# Patient Record
Sex: Female | Born: 1980 | Hispanic: Yes | Marital: Married | Smoking: Never smoker
Health system: Southern US, Community
[De-identification: ages and names within clinical notes are randomized; demographics above are authoritative.]

## PROBLEM LIST (undated history)

## (undated) DIAGNOSIS — F329 Major depressive disorder, single episode, unspecified: Secondary | ICD-10-CM

## (undated) DIAGNOSIS — E041 Nontoxic single thyroid nodule: Secondary | ICD-10-CM

## (undated) DIAGNOSIS — F32A Depression, unspecified: Secondary | ICD-10-CM

## (undated) DIAGNOSIS — Z9221 Personal history of antineoplastic chemotherapy: Secondary | ICD-10-CM

## (undated) DIAGNOSIS — Z923 Personal history of irradiation: Secondary | ICD-10-CM

## (undated) DIAGNOSIS — Z803 Family history of malignant neoplasm of breast: Secondary | ICD-10-CM

## (undated) HISTORY — DX: Family history of malignant neoplasm of breast: Z80.3

## (undated) HISTORY — PX: OTHER SURGICAL HISTORY: SHX169

## (undated) HISTORY — PX: WISDOM TOOTH EXTRACTION: SHX21

---

## 2014-11-21 ENCOUNTER — Encounter (HOSPITAL_BASED_OUTPATIENT_CLINIC_OR_DEPARTMENT_OTHER): Payer: Self-pay | Admitting: Emergency Medicine

## 2014-11-21 ENCOUNTER — Emergency Department (HOSPITAL_BASED_OUTPATIENT_CLINIC_OR_DEPARTMENT_OTHER)
Admission: EM | Admit: 2014-11-21 | Discharge: 2014-11-21 | Disposition: A | Discharge status: Home / Self Care | Payer: Self-pay | Attending: Emergency Medicine | Admitting: Emergency Medicine

## 2014-11-21 DIAGNOSIS — R059 Cough, unspecified: Secondary | ICD-10-CM

## 2014-11-21 DIAGNOSIS — Z880 Allergy status to penicillin: Secondary | ICD-10-CM | POA: Insufficient documentation

## 2014-11-21 DIAGNOSIS — R05 Cough: Secondary | ICD-10-CM

## 2014-11-21 DIAGNOSIS — J029 Acute pharyngitis, unspecified: Secondary | ICD-10-CM | POA: Insufficient documentation

## 2014-11-21 DIAGNOSIS — H9209 Otalgia, unspecified ear: Secondary | ICD-10-CM | POA: Insufficient documentation

## 2014-11-21 LAB — RAPID STREP SCREEN (MED CTR MEBANE ONLY): Streptococcus, Group A Screen (Direct): NEGATIVE

## 2014-11-21 MED ORDER — IBUPROFEN 800 MG PO TABS: 800.0000 mg | ORAL_TABLET | Freq: Three times a day (TID) | ORAL | Status: DC

## 2014-11-21 MED ORDER — BENZONATATE 100 MG PO CAPS: 100.0000 mg | ORAL_CAPSULE | Freq: Three times a day (TID) | ORAL | Status: DC

## 2014-11-21 NOTE — ED Notes (Signed)
Pt reports occasional cough with green product, also reports onset sore throat this AM that is worsening

## 2014-11-21 NOTE — Discharge Instructions (Signed)
Cough, Adult  A cough is a reflex that helps clear your throat and airways. It can help heal the body or may be a reaction to an irritated airway. A cough may only last 2 or 3 weeks (acute) or may last more than 8 weeks (chronic).  CAUSES Acute cough:  Viral or bacterial infections. Chronic cough:  Infections.  Allergies.  Asthma.  Post-nasal drip.  Smoking.  Heartburn or acid reflux.  Some medicines.  Chronic lung problems (COPD).  Cancer. SYMPTOMS   Cough.  Fever.  Chest pain.  Increased breathing rate.  High-pitched whistling sound when breathing (wheezing).  Colored mucus that you cough up (sputum). TREATMENT   A bacterial cough may be treated with antibiotic medicine.  A viral cough must run its course and will not respond to antibiotics.  Your caregiver may recommend other treatments if you have a chronic cough. HOME CARE INSTRUCTIONS   Only take over-the-counter or prescription medicines for pain, discomfort, or fever as directed by your caregiver. Use cough suppressants only as directed by your caregiver.  Use a cold steam vaporizer or humidifier in your bedroom or home to help loosen secretions.  Sleep in a semi-upright position if your cough is worse at night.  Rest as needed.  Stop smoking if you smoke. SEEK IMMEDIATE MEDICAL CARE IF:   You have pus in your sputum.  Your cough starts to worsen.  You cannot control your cough with suppressants and are losing sleep.  You begin coughing up blood.  You have difficulty breathing.  You develop pain which is getting worse or is uncontrolled with medicine.  You have a fever. MAKE SURE YOU:   Understand these instructions.  Will watch your condition.  Will get help right away if you are not doing well or get worse. Document Released: 03/24/2011 Document Revised: 12/18/2011 Document Reviewed: 03/24/2011 [facility] Patient Information 2015 [place], [place]. This information is not intended  to replace advice given to you by your health care provider. Make sure you discuss any questions you have with your health care provider.  Pharyngitis Pharyngitis is a sore throat (pharynx). There is redness, pain, and swelling of your throat. HOME CARE   Drink enough fluids to keep your pee (urine) clear or pale yellow.  Only take medicine as told by your doctor.  You may get sick again if you do not take medicine as told. Finish your medicines, even if you start to feel better.  Do not take aspirin.  Rest.  Rinse your mouth (gargle) with salt water ( tsp of salt per 1 qt of water) every 1-2 hours. This will help the pain.  If you are not at risk for choking, you can suck on hard candy or sore throat lozenges. GET HELP IF:  You have large, tender lumps on your neck.  You have a rash.  You cough up green, yellow-brown, or bloody spit. GET HELP RIGHT AWAY IF:   You have a stiff neck.  You drool or cannot swallow liquids.  You throw up (vomit) or are not able to keep medicine or liquids down.  You have very bad pain that does not go away with medicine.  You have problems breathing (not from a stuffy nose). MAKE SURE YOU:   Understand these instructions.  Will watch your condition.  Will get help right away if you are not doing well or get worse. Document Released: 03/13/2008 Document Revised: 07/16/2013 Document Reviewed: 06/02/2013 [facility] Patient Information 2015 [place], [place]. This information  is not intended to replace advice given to you by your health care provider. Make sure you discuss any questions you have with your health care provider.

## 2014-11-21 NOTE — ED Notes (Signed)
Pt alert, NAD, calm, interactive, steady gait in to room, Dr. Jeneen Rinks into room, at [facility].

## 2014-11-21 NOTE — ED Provider Notes (Signed)
CSN: [id number]     Arrival date & time 11/21/14  1959 History   First MD Initiated Contact with Patient 11/21/14 2018     Chief Complaint  Patient presents with  . Sore Throat  . Otalgia      HPI  His reevaluation sore throat since this morning, cough 4 days. No fevers no chills. No sinus pain or pressure. He only drinks that difficulty. Some right ear pain. Dry nonproductive cough intermittently producing some yellow phlegm. No hemoptysis. No leg pain or swelling.  History reviewed. No pertinent past medical history. History reviewed. No pertinent past surgical history. History reviewed. No pertinent family history. History  Substance Use Topics  . Smoking status: Never Smoker   . Smokeless tobacco: Never Used  . Alcohol Use: No   OB History    No data available     Review of Systems  Constitutional: Negative for fever, chills, diaphoresis, appetite change and fatigue.  HENT: Positive for ear pain and sore throat. Negative for mouth sores and trouble swallowing.   Eyes: Negative for visual disturbance.  Respiratory: Positive for cough. Negative for chest tightness, shortness of breath and wheezing.   Cardiovascular: Negative for chest pain.  Gastrointestinal: Negative for nausea, vomiting, abdominal pain, diarrhea and abdominal distention.  Endocrine: Negative for polydipsia, polyphagia and polyuria.  Genitourinary: Negative for dysuria, frequency and hematuria.  Musculoskeletal: Negative for gait problem.  Skin: Negative for color change, pallor and rash.  Neurological: Negative for dizziness, syncope, light-headedness and headaches.  Hematological: Does not bruise/bleed easily.  Psychiatric/Behavioral: Negative for behavioral problems and confusion.      Allergies  Penicillins  Home Medications   Prior to Admission medications   Medication Sig Start Date End Date Taking? Authorizing Provider  benzonatate (TESSALON) 100 MG capsule Take 1 capsule (100 mg total)  by mouth every 8 (eight) hours. 11/21/14   Tanna Furry, MD  ibuprofen (ADVIL,MOTRIN) 800 MG tablet Take 1 tablet (800 mg total) by mouth 3 (three) times daily. 11/21/14   Tanna Furry, MD   BP 116/76 mmHg  Pulse 82  Temp(Src) 99 F (37.2 C) (Oral)  Resp 18  Wt 118 lb (53.524 kg)  SpO2 99%  LMP 11/05/2014 Physical Exam  Constitutional: She is oriented to person, place, and time. She appears well-developed and well-nourished. No distress.  HENT:  Head: Normocephalic.  Normal TMs without fluid or infection. Normal pharynx without erythema asymmetry. No strawberry appearance the tongue. Petechiae on the palate. No anterior cervical adenopathy. Supple neck.  Eyes: Conjunctivae are normal. Pupils are equal, round, and reactive to light. No scleral icterus.  Neck: Normal range of motion. Neck supple. No thyromegaly present.  Cardiovascular: Normal rate and regular rhythm.  Exam reveals no gallop and no friction rub.   No murmur heard. Pulmonary/Chest: Effort normal and breath sounds normal. No respiratory distress. She has no wheezes. She has no rales.  Her bilateral breath sounds. No asymmetry. No wheezing rales rhonchi or prolongation.  Abdominal: Soft. Bowel sounds are normal. She exhibits no distension. There is no tenderness. There is no rebound.  Musculoskeletal: Normal range of motion.  Neurological: She is alert and oriented to person, place, and time.  Skin: Skin is warm and dry. No rash noted.  Psychiatric: She has a normal mood and affect. Her behavior is normal.    ED Course  Procedures (including critical care time) Labs Review Labs Reviewed  RAPID STREP SCREEN    Imaging Review No results found.   EKG  Interpretation None      MDM   Final diagnoses:  Pharyngitis  Cough    Normal exam. Clear lungs. Well oxygenated. Afebrile. Anxious appropriate for simple expectant management.   Tanna Furry, MD 11/21/14 2033

## 2014-11-23 LAB — CULTURE, GROUP A STREP

## 2014-11-25 ENCOUNTER — Ambulatory Visit: Payer: Self-pay | Attending: Internal Medicine | Admitting: Physician Assistant

## 2014-11-25 VITALS — BP 144/93 | HR 98 | Temp 98.6°F | Resp 22 | Ht 61.0 in | Wt 116.4 lb

## 2014-11-25 DIAGNOSIS — J029 Acute pharyngitis, unspecified: Secondary | ICD-10-CM | POA: Insufficient documentation

## 2014-11-25 MED ORDER — AZITHROMYCIN 250 MG PO TABS: ORAL_TABLET | ORAL | Status: DC

## 2014-11-25 NOTE — Progress Notes (Signed)
Chief Complaint: Sore throat, I can't eat  Subjective: 34 year old female recently seen in the emergency department on February 13 for complaints of sore throat. She also had a cough, right ear pain. She was strep negative. She was diagnosed with pharyngitis and given Tessalon and Advil. She's now had worsening symptoms. She has been taking the medications. The whole right side of the face hurts. She's having difficulty opening her mouth. She is having difficulty eating and drinking. She has a burning sensation. Her symptoms started on Saturday.   ROS:  GEN: denies fever or chills, denies change in weight Skin: denies lesions or rashes HEENT: + headache, earache, epistaxis, sore throat, or neck pain NEURO: denies numbness or tingling, denies sz, stroke or TIA   Objective:  Filed Vitals:   11/25/14 1233  BP: 144/93  Pulse: 98  Temp: 98.6 F (37 C)  TempSrc: Oral  Resp: 22  Height: 5\' 1"  (1.549 m)  Weight: 116 lb 6.4 oz (52.799 kg)  SpO2: 100%    Physical Exam:  General: in no acute distress. HEENT: no pallor, no icterus, moist oral mucosa, no JVD, no lymphadenopathy Heart: Normal  s1 &s2  Regular rate and rhythm, without murmurs, rubs, gallops. Neuro: Alert, awake, oriented x3, nonfocal.  Pertinent Lab Results:none   Medications: Prior to Admission medications   Medication Sig Start Date End Date Taking? Authorizing Provider  azithromycin (ZITHROMAX) 250 MG tablet 2 tablets a day. Then 1 tablet per day for the next 4 days. 11/25/14   Kaleisha Bhargava Daneil Dan, PA-C  benzonatate (TESSALON) 100 MG capsule Take 1 capsule (100 mg total) by mouth every 8 (eight) hours. 11/21/14   Tanna Furry, MD  ibuprofen (ADVIL,MOTRIN) 800 MG tablet Take 1 tablet (800 mg total) by mouth 3 (three) times daily. 11/21/14   Tanna Furry, MD    Assessment: 1. Pharyngitis   Plan: Azithromycin, patient allergic to amoxicillin Return to work on Friday  Follow up: As needed  The patient was given clear  instructions to go to ER or return to medical center if symptoms don't improve, worsen or new problems develop. The patient verbalized understanding. The patient was told to call to get lab results if they haven't heard anything in the next week.   This note has been created with Surveyor, quantity. Any transcriptional errors are unintentional.   Zettie Pho, PA-C 11/25/2014, 12:52 PM

## 2014-11-25 NOTE — Progress Notes (Signed)
Pt presents to clinic for follow up after ED visit for right ear pain and sore throat. Pt states she difficulty swallowing and has been taking ibuprofen with no relief.

## 2015-08-18 ENCOUNTER — Ambulatory Visit: Payer: Self-pay | Admitting: Physician Assistant

## 2016-03-28 ENCOUNTER — Other Ambulatory Visit (HOSPITAL_COMMUNITY): Payer: Self-pay | Admitting: Family

## 2016-03-28 DIAGNOSIS — E041 Nontoxic single thyroid nodule: Secondary | ICD-10-CM

## 2016-03-31 ENCOUNTER — Ambulatory Visit (HOSPITAL_COMMUNITY)
Admission: RE | Admit: 2016-03-31 | Discharge: 2016-03-31 | Disposition: A | Discharge status: Home / Self Care | Payer: Self-pay | Source: Ambulatory Visit | Attending: Family | Admitting: Family

## 2016-03-31 DIAGNOSIS — E041 Nontoxic single thyroid nodule: Secondary | ICD-10-CM | POA: Insufficient documentation

## 2016-04-19 ENCOUNTER — Other Ambulatory Visit (HOSPITAL_COMMUNITY): Payer: Self-pay | Admitting: Family

## 2016-04-19 DIAGNOSIS — E041 Nontoxic single thyroid nodule: Secondary | ICD-10-CM

## 2016-04-27 ENCOUNTER — Ambulatory Visit (HOSPITAL_COMMUNITY)
Admission: RE | Admit: 2016-04-27 | Discharge: 2016-04-27 | Disposition: A | Discharge status: Home / Self Care | Payer: Self-pay | Source: Ambulatory Visit | Attending: Family | Admitting: Family

## 2016-04-27 DIAGNOSIS — E041 Nontoxic single thyroid nodule: Secondary | ICD-10-CM | POA: Insufficient documentation

## 2016-04-27 MED ORDER — LIDOCAINE HCL (PF) 2 % IJ SOLN
INTRAMUSCULAR | Status: AC
  Filled 2016-04-27: qty 10

## 2016-04-27 NOTE — Discharge Instructions (Signed)

## 2016-05-29 ENCOUNTER — Ambulatory Visit (INDEPENDENT_AMBULATORY_CARE_PROVIDER_SITE_OTHER): Payer: Self-pay | Admitting: Otolaryngology

## 2016-05-29 DIAGNOSIS — D440 Neoplasm of uncertain behavior of thyroid gland: Secondary | ICD-10-CM

## 2016-07-09 DIAGNOSIS — E041 Nontoxic single thyroid nodule: Secondary | ICD-10-CM

## 2016-07-09 HISTORY — DX: Nontoxic single thyroid nodule: E04.1

## 2016-07-10 ENCOUNTER — Encounter: Payer: Self-pay | Admitting: "Endocrinology

## 2016-07-10 ENCOUNTER — Ambulatory Visit (INDEPENDENT_AMBULATORY_CARE_PROVIDER_SITE_OTHER): Payer: Self-pay | Admitting: "Endocrinology

## 2016-07-10 VITALS — BP 103/69 | HR 60 | Ht 61.0 in | Wt 119.0 lb

## 2016-07-10 DIAGNOSIS — D497 Neoplasm of unspecified behavior of endocrine glands and other parts of nervous system: Secondary | ICD-10-CM | POA: Insufficient documentation

## 2016-07-10 NOTE — Progress Notes (Signed)
Subjective:    Patient ID: Sophia Moore, female    DOB: 25-May-1981, PCP Soyla Dryer, PA-C   History reviewed. No pertinent past medical history. History reviewed. No pertinent surgical history. Social History   Social History  . Marital status: Divorced    Spouse name: N/A  . Number of children: N/A  . Years of education: N/A   Social History Main Topics  . Smoking status: Never Smoker  . Smokeless tobacco: Never Used  . Alcohol use No  . Drug use: No  . Sexual activity: Not Currently   Other Topics Concern  . None   Social History Narrative  . None   Outpatient Encounter Prescriptions as of 07/10/2016  Medication Sig  . citalopram (CELEXA) 40 MG tablet Take 40 mg by mouth daily.  . methimazole (TAPAZOLE) 10 MG tablet Take 10 mg by mouth 3 (three) times daily.  . [DISCONTINUED] azithromycin (ZITHROMAX) 250 MG tablet 2 tablets a day. Then 1 tablet per day for the next 4 days.  . [DISCONTINUED] benzonatate (TESSALON) 100 MG capsule Take 1 capsule (100 mg total) by mouth every 8 (eight) hours.  . [DISCONTINUED] ibuprofen (ADVIL,MOTRIN) 800 MG tablet Take 1 tablet (800 mg total) by mouth 3 (three) times daily.   No facility-administered encounter medications on file as of 07/10/2016.    ALLERGIES: Allergies  Allergen Reactions  . Penicillins    VACCINATION STATUS:  There is no immunization history on file for this patient.  HPI 35 year old female patient with medical history as above. She is being seen in consultation for abnormal thyroid FNA requested by her PCP Soyla Dryer, PA-C. - She is reporting that she has known about goiter for at least a year. In June thyroid ultrasound showed solitary nodule 1.5 cm on the right lobe of her thyroid. She was subjected for fine needle aspiration on 04/27/2016 which revealed follicular neoplasm category IV. -She did not have proper insurance to proceed with appropriate care at that time. For reported thyroid  overactivity she was started on methimazole 10 mg by mouth daily. - She denies family history of thyroid dysfunction. She denies exposure to neck radiation. She denies heat/cold intolerance. She has steady body weight. Denies dysphagia, shortness of breath, nor voice change.  Review of Systems  Constitutional: no weight gain/loss, no fatigue, no subjective hyperthermia/hypothermia Eyes: no blurry vision, no xerophthalmia ENT: no sore throat, no nodules palpated in throat, no dysphagia/odynophagia, no hoarseness, she has goiter. Cardiovascular: no CP/SOB/palpitations/leg swelling Respiratory: no cough/SOB Gastrointestinal: no N/V/D/C Musculoskeletal: no muscle/joint aches Skin: no rashes Neurological: no tremors/numbness/tingling/dizziness Psychiatric: no depression/anxiety  Objective:    BP 103/69   Pulse 60   Ht 5\' 1"  (1.549 m)   Wt 119 lb (54 kg)   BMI 22.48 kg/m   Wt Readings from Last 3 Encounters:  07/10/16 119 lb (54 kg)  11/25/14 116 lb 6.4 oz (52.8 kg)  11/21/14 118 lb (53.5 kg)    Physical Exam Constitutional: overweight, in NAD Eyes: PERRLA, EOMI, no exophthalmos ENT: moist mucous membranes, + palpable thyromegaly, no cervical lymphadenopathy Cardiovascular: RRR, No MRG Respiratory: CTA B Gastrointestinal: abdomen soft, NT, ND, BS+ Musculoskeletal: no deformities, strength intact in all 4 Skin: moist, warm, no rashes Neurological: no tremor with outstretched hands, DTR normal in all 4   July 20 11/29/2015 fine-needle aspiration of 1.5 cm solitary right lobe thyroid nodule showed: Follicular neoplasm or suspicious for a follicular neoplasm (bethesda  category IV).  Assessment & Plan:   1.  Follicular neoplasm of thyroid - I have reviewed her available thyroid  records and clinically evaluated the patient. Based on the abnormal FNA pointing towards a follicular neoplasm she has as high as  30% chance of malignancy. I have advised her for  total thyroidectomy. She  is made aware of the fact that she may require lifelong thyroid hormone replacement. - since she has seen Dr. Benjamine Mola, for her FNA, I will refer her back to him for surgery. She will have repeat thyroid function test before her next visit in 6 weeks. - If her surgical sample reveals malignancy, she will be considered for I-131 thyroid remnant ablation.   - I advised patient to maintain close follow up with Soyla Dryer, PA-C for primary care needs. Follow up plan: Return in about 6 weeks (around 08/21/2016) for with labs after surgery.  Glade Lloyd, MD Phone: [phone]  Fax: [phone]   07/10/2016, 1:34 PM

## 2016-07-31 ENCOUNTER — Other Ambulatory Visit: Payer: Self-pay | Admitting: Otolaryngology

## 2016-08-08 ENCOUNTER — Encounter (HOSPITAL_BASED_OUTPATIENT_CLINIC_OR_DEPARTMENT_OTHER): Payer: Self-pay | Admitting: *Deleted

## 2016-08-08 NOTE — Pre-Procedure Instructions (Signed)
Pt. was offered interpreter for family; states her son is 45 and speaks [place], refused interpreter.

## 2016-08-09 ENCOUNTER — Telehealth: Payer: Self-pay | Admitting: "Endocrinology

## 2016-08-09 NOTE — Telephone Encounter (Signed)
Please advise 

## 2016-08-09 NOTE — Telephone Encounter (Addendum)
Patient is concerned that Dr.Teoh is only removing half of her thyroid after Dr.Nida said it would be best for him to remove all of it. Please advise patient on what to do.   Patient called back on 08-10-16 to request Dr.Nida to contact Dr.Teoh and let him know that he should remove the entire thyroid. Dr.Teoh is waiting to hear from [place]. Her surgery is scheduled for next Tuesday.

## 2016-08-10 NOTE — Telephone Encounter (Signed)
Yes R spoke with Dr. Benjamine Mola, and he agrees that she will have total thyroidectomy.

## 2016-08-11 NOTE — Telephone Encounter (Signed)
Left message for pt to call back  °

## 2016-08-14 NOTE — Telephone Encounter (Signed)
Pt.notified

## 2016-09-05 ENCOUNTER — Telehealth: Payer: Self-pay | Admitting: "Endocrinology

## 2016-09-05 NOTE — Telephone Encounter (Signed)
Pt.notified

## 2016-09-05 NOTE — Telephone Encounter (Signed)
Surgery is scheduled for 09-13-16

## 2016-09-05 NOTE — Telephone Encounter (Signed)
Based on my discussion with Dr. Benjamine Mola, she is supposed to have total thyroidectomy. If she is undergoing total thyroidectomy , she will not need methimazole. Do we know if she has scheduled her surgery ?

## 2016-09-05 NOTE — Telephone Encounter (Signed)
Patient LMOM to request a refill on her methimazole to [facility] [place].

## 2016-09-05 NOTE — Telephone Encounter (Signed)
Pt is requesting methimazole. I did not see this in the office note to continue. Should she be on this?

## 2016-09-05 NOTE — Telephone Encounter (Signed)
Okay, good. She does not need that methimazole, she will be started on thyroid hormone after her surgery.

## 2016-09-08 NOTE — Pre-Procedure Instructions (Signed)
[facility]  09/08/2016      [place], [place] [address] [place] [address] Phone: [phone] Fax: [phone]  [facility] [place], [place] - [address] [place] [address] [place] [address] Phone: [phone] Fax: [phone]  [id number] [place], [place] - [address] AT [facility] [address] [address] [place] [address] Phone: [phone] Fax: [phone]    Your procedure is scheduled on Wed, Dec 6 @ 8:30 AM  Report to [place] at 6:30 AM  Call this number if you have problems the morning of surgery:  [phone]   Remember:  Do not eat food or drink liquids after midnight.  Take these medicines the morning of surgery with A SIP OF WATER Celexa(Citalopram)              No Goody's,BC's,Aleve,Advil,Motrin,Ibuprofen,Fish Oil,or any Herbal Medications.    Do not wear jewelry, make-up or nail polish.  Do not wear lotions, powders,perfumes, or deoderant.  Do not shave 48 hours prior to surgery.    Do not bring valuables to the hospital.  [facility] is not responsible for any belongings or valuables.  Contacts, dentures or bridgework may not be worn into surgery.  Leave your suitcase in the car.  After surgery it may be brought to your room.  For patients admitted to the hospital, discharge time will be determined by your treatment team.  Patients discharged the day of surgery will not be allowed to drive home.    [facility]  Before surgery, you can play an important role.  Because skin is not sterile, your skin needs to be as free of germs as possible.  You can reduce the number of germs on you skin by washing with CHG (chlorahexidine gluconate) soap before surgery.  CHG is an antiseptic cleaner which kills germs and bonds  with the skin to continue killing germs even after washing.  Please DO NOT use if you have an allergy to CHG or antibacterial soaps.  If your skin becomes reddened/irritated stop using the CHG and inform your nurse when you arrive at Short Stay.  Do not shave (including legs and underarms) for at least 48 hours prior to the first CHG shower.  You may shave your face.  Please follow these instructions carefully:   1.  Shower with CHG Soap the night before surgery and the                                morning of Surgery.  2.  If you choose to wash your hair, wash your hair first as usual with your       normal shampoo.  3.  After you shampoo, rinse your hair and body thoroughly to remove the                      Shampoo.  4.  Use CHG as you would any other liquid soap.  You can apply chg directly       to the skin and wash gently with scrungie or a clean washcloth.  5.  Apply the CHG Soap to your body ONLY FROM THE NECK DOWN.  Do not use on open wounds or open sores.  Avoid contact with your eyes,       ears, mouth and genitals (private parts).  Wash genitals (private parts)       with your normal soap.  6.  Wash thoroughly, paying special attention to the area where your surgery        will be performed.  7.  Thoroughly rinse your body with warm water from the neck down.  8.  DO NOT shower/wash with your normal soap after using and rinsing off       the CHG Soap.  9.  Pat yourself dry with a clean towel.            10.  Wear clean pajamas.            11.  Place clean sheets on your bed the night of your first shower and do not        sleep with pets.  Day of Surgery  Do not apply any lotions/deoderants the morning of surgery.  Please wear clean clothes to the hospital/surgery center.     Please read over the following fact sheets that you were given. Pain Booklet, Coughing and Deep Breathing and Surgical Site Infection Prevention

## 2016-09-11 ENCOUNTER — Ambulatory Visit (HOSPITAL_COMMUNITY)
Admission: RE | Admit: 2016-09-11 | Discharge: 2016-09-11 | Disposition: A | Discharge status: Home / Self Care | Payer: BC Managed Care – PPO | Source: Ambulatory Visit | Attending: Anesthesiology | Admitting: Anesthesiology

## 2016-09-11 ENCOUNTER — Encounter (HOSPITAL_COMMUNITY)
Admission: RE | Admit: 2016-09-11 | Discharge: 2016-09-11 | Disposition: A | Discharge status: Home / Self Care | Payer: BC Managed Care – PPO | Source: Ambulatory Visit | Attending: Otolaryngology | Admitting: Otolaryngology

## 2016-09-11 ENCOUNTER — Encounter (HOSPITAL_COMMUNITY): Payer: Self-pay

## 2016-09-11 DIAGNOSIS — Z01818 Encounter for other preprocedural examination: Secondary | ICD-10-CM | POA: Insufficient documentation

## 2016-09-11 DIAGNOSIS — E041 Nontoxic single thyroid nodule: Secondary | ICD-10-CM | POA: Insufficient documentation

## 2016-09-11 DIAGNOSIS — Z01812 Encounter for preprocedural laboratory examination: Secondary | ICD-10-CM | POA: Insufficient documentation

## 2016-09-11 LAB — CBC
HEMATOCRIT: 43.7 % (ref 36.0–46.0)
Hemoglobin: 15.2 g/dL — ABNORMAL HIGH (ref 12.0–15.0)
MCH: 29.6 pg (ref 26.0–34.0)
MCHC: 34.8 g/dL (ref 30.0–36.0)
MCV: 85 fL (ref 78.0–100.0)
Platelets: 257 10*3/uL (ref 150–400)
RBC: 5.14 MIL/uL — ABNORMAL HIGH (ref 3.87–5.11)
RDW: 12.5 % (ref 11.5–15.5)
WBC: 4.7 10*3/uL (ref 4.0–10.5)

## 2016-09-11 LAB — HCG, SERUM, QUALITATIVE: Preg, Serum: NEGATIVE

## 2016-09-11 NOTE — Progress Notes (Addendum)
Cardiologist denies  Medical Md looking for one. Didn't have insurance and was going to health dept   Echo/Stress test/heart cath denies  EKG denies in past yr   CXR denies in past yr

## 2016-09-13 ENCOUNTER — Ambulatory Visit (HOSPITAL_BASED_OUTPATIENT_CLINIC_OR_DEPARTMENT_OTHER)
Admission: RE | Admit: 2016-09-13 | Discharge: 2016-09-14 | Disposition: A | Discharge status: Home / Self Care | Payer: BC Managed Care – PPO | Source: Ambulatory Visit | Attending: Otolaryngology | Admitting: Otolaryngology

## 2016-09-13 ENCOUNTER — Encounter (HOSPITAL_COMMUNITY): Payer: Self-pay | Admitting: *Deleted

## 2016-09-13 ENCOUNTER — Ambulatory Visit (HOSPITAL_COMMUNITY): Payer: BC Managed Care – PPO | Admitting: Anesthesiology

## 2016-09-13 ENCOUNTER — Encounter (HOSPITAL_COMMUNITY)
Admission: RE | Disposition: A | Discharge status: Home / Self Care | Payer: Self-pay | Source: Ambulatory Visit | Attending: Otolaryngology

## 2016-09-13 DIAGNOSIS — Z880 Allergy status to penicillin: Secondary | ICD-10-CM | POA: Diagnosis not present

## 2016-09-13 DIAGNOSIS — E041 Nontoxic single thyroid nodule: Secondary | ICD-10-CM | POA: Diagnosis present

## 2016-09-13 DIAGNOSIS — R079 Chest pain, unspecified: Secondary | ICD-10-CM | POA: Diagnosis not present

## 2016-09-13 DIAGNOSIS — D34 Benign neoplasm of thyroid gland: Secondary | ICD-10-CM | POA: Insufficient documentation

## 2016-09-13 DIAGNOSIS — F329 Major depressive disorder, single episode, unspecified: Secondary | ICD-10-CM | POA: Insufficient documentation

## 2016-09-13 DIAGNOSIS — E039 Hypothyroidism, unspecified: Secondary | ICD-10-CM | POA: Diagnosis not present

## 2016-09-13 DIAGNOSIS — E890 Postprocedural hypothyroidism: Secondary | ICD-10-CM | POA: Diagnosis present

## 2016-09-13 DIAGNOSIS — D440 Neoplasm of uncertain behavior of thyroid gland: Secondary | ICD-10-CM | POA: Diagnosis not present

## 2016-09-13 HISTORY — DX: Depression, unspecified: F32.A

## 2016-09-13 HISTORY — DX: Nontoxic single thyroid nodule: E04.1

## 2016-09-13 HISTORY — DX: Major depressive disorder, single episode, unspecified: F32.9

## 2016-09-13 HISTORY — PX: THYROIDECTOMY: SHX17

## 2016-09-13 LAB — CALCIUM
Calcium: 9.1 mg/dL (ref 8.9–10.3)
Calcium: 9.4 mg/dL (ref 8.9–10.3)

## 2016-09-13 SURGERY — THYROIDECTOMY
Anesthesia: General

## 2016-09-13 MED ORDER — PROPOFOL 10 MG/ML IV BOLUS
INTRAVENOUS | Status: DC | PRN
  Administered 2016-09-13: 20 mg via INTRAVENOUS
  Administered 2016-09-13: 150 mg via INTRAVENOUS

## 2016-09-13 MED ORDER — DEXAMETHASONE SODIUM PHOSPHATE 10 MG/ML IJ SOLN
INTRAMUSCULAR | Status: DC | PRN
  Administered 2016-09-13: 10 mg via INTRAVENOUS

## 2016-09-13 MED ORDER — FENTANYL CITRATE (PF) 100 MCG/2ML IJ SOLN
INTRAMUSCULAR | Status: DC | PRN
  Administered 2016-09-13 (×4): 50 ug via INTRAVENOUS

## 2016-09-13 MED ORDER — PHENYLEPHRINE HCL 10 MG/ML IJ SOLN
INTRAVENOUS | Status: DC | PRN
  Administered 2016-09-13: 10 ug/min via INTRAVENOUS

## 2016-09-13 MED ORDER — PROPOFOL 10 MG/ML IV BOLUS
INTRAVENOUS | Status: AC
  Filled 2016-09-13: qty 20

## 2016-09-13 MED ORDER — FENTANYL CITRATE (PF) 100 MCG/2ML IJ SOLN
INTRAMUSCULAR | Status: AC
  Filled 2016-09-13: qty 2

## 2016-09-13 MED ORDER — MORPHINE SULFATE (PF) 2 MG/ML IV SOLN
2.0000 mg | INTRAVENOUS | Status: DC | PRN
  Administered 2016-09-13: 4 mg via INTRAVENOUS
  Filled 2016-09-13: qty 2

## 2016-09-13 MED ORDER — OXYCODONE-ACETAMINOPHEN 5-325 MG PO TABS: 1.0000 | ORAL_TABLET | ORAL | 0 refills | Status: DC | PRN

## 2016-09-13 MED ORDER — HYDROMORPHONE HCL 1 MG/ML IJ SOLN: 0.2500 mg | INTRAMUSCULAR | Status: DC | PRN

## 2016-09-13 MED ORDER — LIDOCAINE HCL (CARDIAC) 20 MG/ML IV SOLN
INTRAVENOUS | Status: DC | PRN
  Administered 2016-09-13: 60 mg via INTRAVENOUS

## 2016-09-13 MED ORDER — ONDANSETRON HCL 4 MG/2ML IJ SOLN
4.0000 mg | INTRAMUSCULAR | Status: DC | PRN
  Administered 2016-09-13: 4 mg via INTRAVENOUS
  Filled 2016-09-13: qty 2

## 2016-09-13 MED ORDER — KCL IN DEXTROSE-NACL 20-5-0.45 MEQ/L-%-% IV SOLN
INTRAVENOUS | Status: DC
  Administered 2016-09-13: 14:00:00 via INTRAVENOUS
  Filled 2016-09-13: qty 1000

## 2016-09-13 MED ORDER — LACTATED RINGERS IV SOLN
INTRAVENOUS | Status: DC | PRN
  Administered 2016-09-13: 08:00:00 via INTRAVENOUS

## 2016-09-13 MED ORDER — LIDOCAINE HCL (PF) 1 % IJ SOLN
INTRAMUSCULAR | Status: AC
  Filled 2016-09-13: qty 30

## 2016-09-13 MED ORDER — ONDANSETRON HCL 4 MG PO TABS: 4.0000 mg | ORAL_TABLET | ORAL | Status: DC | PRN

## 2016-09-13 MED ORDER — LIDOCAINE 2% (20 MG/ML) 5 ML SYRINGE
INTRAMUSCULAR | Status: AC
  Filled 2016-09-13: qty 5

## 2016-09-13 MED ORDER — LIDOCAINE-EPINEPHRINE 2 %-1:100000 IJ SOLN
INTRAMUSCULAR | Status: AC
  Filled 2016-09-13: qty 1

## 2016-09-13 MED ORDER — MIDAZOLAM HCL 2 MG/2ML IJ SOLN
INTRAMUSCULAR | Status: AC
  Filled 2016-09-13: qty 2

## 2016-09-13 MED ORDER — CLINDAMYCIN HCL 300 MG PO CAPS: 300.0000 mg | ORAL_CAPSULE | Freq: Three times a day (TID) | ORAL | 0 refills | Status: AC

## 2016-09-13 MED ORDER — LIDOCAINE-EPINEPHRINE 2 %-1:100000 IJ SOLN
INTRAMUSCULAR | Status: DC | PRN
  Administered 2016-09-13: 3 mL

## 2016-09-13 MED ORDER — OXYCODONE-ACETAMINOPHEN 5-325 MG PO TABS
1.0000 | ORAL_TABLET | ORAL | Status: DC | PRN
  Administered 2016-09-13 – 2016-09-14 (×4): 2 via ORAL
  Filled 2016-09-13 (×4): qty 2

## 2016-09-13 MED ORDER — MIDAZOLAM HCL 5 MG/5ML IJ SOLN
INTRAMUSCULAR | Status: DC | PRN
  Administered 2016-09-13: 2 mg via INTRAVENOUS

## 2016-09-13 MED ORDER — 0.9 % SODIUM CHLORIDE (POUR BTL) OPTIME
TOPICAL | Status: DC | PRN
  Administered 2016-09-13: 1000 mL

## 2016-09-13 MED ORDER — PROMETHAZINE HCL 25 MG/ML IJ SOLN
INTRAMUSCULAR | Status: AC
  Administered 2016-09-13: 13:00:00
  Filled 2016-09-13: qty 1

## 2016-09-13 MED ORDER — PROMETHAZINE HCL 25 MG/ML IJ SOLN
6.2500 mg | INTRAMUSCULAR | Status: DC | PRN
  Administered 2016-09-13: 6.25 mg via INTRAVENOUS

## 2016-09-13 MED ORDER — CITALOPRAM HYDROBROMIDE 20 MG PO TABS
40.0000 mg | ORAL_TABLET | Freq: Every day | ORAL | Status: DC
  Administered 2016-09-13 – 2016-09-14 (×2): 40 mg via ORAL
  Filled 2016-09-13 (×2): qty 2

## 2016-09-13 MED ORDER — CLINDAMYCIN PHOSPHATE 600 MG/50ML IV SOLN
600.0000 mg | Freq: Once | INTRAVENOUS | Status: AC
  Administered 2016-09-13: 600 mg via INTRAVENOUS
  Filled 2016-09-13: qty 50

## 2016-09-13 MED ORDER — CALCIUM CARBONATE-VITAMIN D 500-200 MG-UNIT PO TABS
2.0000 | ORAL_TABLET | Freq: Two times a day (BID) | ORAL | Status: DC
  Administered 2016-09-13 – 2016-09-14 (×3): 2 via ORAL
  Filled 2016-09-13 (×3): qty 2

## 2016-09-13 MED ORDER — SUCCINYLCHOLINE CHLORIDE 20 MG/ML IJ SOLN
INTRAMUSCULAR | Status: DC | PRN
  Administered 2016-09-13: 100 mg via INTRAVENOUS

## 2016-09-13 MED ORDER — ONDANSETRON HCL 4 MG/2ML IJ SOLN
INTRAMUSCULAR | Status: AC
  Filled 2016-09-13: qty 2

## 2016-09-13 MED ORDER — ONDANSETRON HCL 4 MG/2ML IJ SOLN
INTRAMUSCULAR | Status: DC | PRN
  Administered 2016-09-13: 4 mg via INTRAVENOUS

## 2016-09-13 SURGICAL SUPPLY — 34 items
CLEANER TIP ELECTROSURG 2X2 (MISCELLANEOUS) ×2 IMPLANT
CLIP TI WIDE RED SMALL 24 (CLIP) ×2 IMPLANT
CORDS BIPOLAR (ELECTRODE) ×2 IMPLANT
COVER SURGICAL LIGHT HANDLE (MISCELLANEOUS) ×2 IMPLANT
CRADLE DONUT ADULT HEAD (MISCELLANEOUS) ×2 IMPLANT
DECANTER SPIKE VIAL GLASS SM (MISCELLANEOUS) ×2 IMPLANT
DERMABOND ADVANCED (GAUZE/BANDAGES/DRESSINGS) ×1
DERMABOND ADVANCED .7 DNX12 (GAUZE/BANDAGES/DRESSINGS) ×1 IMPLANT
DRAIN CHANNEL 10F 3/8 F FF (DRAIN) ×2 IMPLANT
DRAPE PROXIMA HALF (DRAPES) ×2 IMPLANT
ELECT COATED BLADE 2.86 ST (ELECTRODE) ×2 IMPLANT
ELECT REM PT RETURN 9FT ADLT (ELECTROSURGICAL) ×2
ELECTRODE REM PT RTRN 9FT ADLT (ELECTROSURGICAL) ×1 IMPLANT
EVACUATOR SILICONE 100CC (DRAIN) ×2 IMPLANT
FORCEPS BIPOLAR SPETZLER 8 1.0 (NEUROSURGERY SUPPLIES) ×2 IMPLANT
GAUZE SPONGE 4X4 16PLY XRAY LF (GAUZE/BANDAGES/DRESSINGS) ×8 IMPLANT
GLOVE ECLIPSE 7.5 STRL STRAW (GLOVE) ×2 IMPLANT
GOWN STRL REUS W/ TWL LRG LVL3 (GOWN DISPOSABLE) ×3 IMPLANT
GOWN STRL REUS W/TWL LRG LVL3 (GOWN DISPOSABLE) ×3
KIT BASIN OR (CUSTOM PROCEDURE TRAY) ×2 IMPLANT
KIT ROOM TURNOVER OR (KITS) ×2 IMPLANT
NEEDLE HYPO 25GX1X1/2 BEV (NEEDLE) ×2 IMPLANT
NS IRRIG 1000ML POUR BTL (IV SOLUTION) ×2 IMPLANT
PAD ARMBOARD 7.5X6 YLW CONV (MISCELLANEOUS) ×2 IMPLANT
PENCIL BUTTON HOLSTER BLD 10FT (ELECTRODE) ×2 IMPLANT
PROBE NERVBE PRASS .33 (MISCELLANEOUS) ×2 IMPLANT
SHEARS HARMONIC 9CM CVD (BLADE) ×2 IMPLANT
SPONGE INTESTINAL PEANUT (DISPOSABLE) ×2 IMPLANT
SUT ETHILON 2 0 FS 18 (SUTURE) ×2 IMPLANT
SUT SILK 2 0 FS (SUTURE) ×2 IMPLANT
SUT SILK 3 0 REEL (SUTURE) ×2 IMPLANT
SUT VICRYL 4-0 PS2 18IN ABS (SUTURE) ×4 IMPLANT
TRAY ENT MC OR (CUSTOM PROCEDURE TRAY) ×2 IMPLANT
TUBE ENDOTRAC EMG 7X10.2 (MISCELLANEOUS) ×2 IMPLANT

## 2016-09-13 NOTE — H&P (Signed)
Cc: Thyroid follicular neoplasm  HPI: The patient is a 35 y/o female who presents today for evaluation of a thyroid nodule. The patient is seen in consultation requested by [facility]. The patient was recently noted to have a right thyroid nodule. 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 The patient currently denies any compressive symptoms. She is currently having symptoms of hypothyroidism and was recently placed on methimazole. The patient is otherwise healthy. No previous ENT surgery is noted.   The patient's review of systems (constitutional, eyes, ENT, cardiovascular, respiratory, GI, musculoskeletal, skin, neurologic, psychiatric, endocrine, hematologic, allergic) is noted in the ROS questionnaire.  It is reviewed with the patient.   Family health history: None.   Major events: None.   Ongoing medical problems: Chest pain, depression.   Social history: The patient is divorced. She denies the use of tobacco, alcohol or illegal drugs.  Exam: General: Communicates without difficulty, well nourished, no acute distress. Head: Normocephalic, no evidence injury, no tenderness, facial buttresses intact without stepoff. Eyes: PERRL, EOMI. No scleral icterus, conjunctivae clear. Neuro: CN II exam reveals vision grossly intact.  No nystagmus at any point of gaze. Ears: Auricles well formed without lesions.  Ear canals are intact without mass or lesion.  No erythema or edema is appreciated.  The TMs are intact without fluid. Nose: External evaluation reveals normal support and skin without lesions.  Dorsum is intact.  Anterior rhinoscopy reveals healthy pink mucosa over anterior aspect of inferior turbinates and intact septum.  No purulence noted. Oral:  Oral cavity and oropharynx are intact, symmetric, without erythema or edema.  Mucosa is moist without lesions. Neck: Full range of motion without pain.  There is no significant lymphadenopathy.  No masses palpable.   Thyroid bed is enlarged on the right.  Parotid glands and submandibular glands equal bilaterally without mass.  Trachea is midline. Neuro:  CN 2-12 grossly intact. Gait normal. Vestibular: No nystagmus at any point of gaze. The cerebellar examination is unremarkable.   Assessment 1.  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  Plan  1.  The physical exam and biopsy findings are reviewed with the patient.  2.  It is explained to the patient that a follicular thyroid lesion on fine needle aspiration biopsy could indicate either a follicular nodule, a follicular adenoma, or a follicular carcinoma. The main difference is the presence of vascular invasion or extracapsular spread for the carcinoma variant. Such features cannot be appreciated on a FNA specimen.  The only definitive way of determining the histology of the nodule is by excising the thyroid nodule. 3.  The patient would like to proceed with the procedure.  4.  The patient's endocrinologist, Dr. Dorris Fetch, has requested that a total thyroidectomy be performed.

## 2016-09-13 NOTE — Anesthesia Preprocedure Evaluation (Addendum)
Anesthesia Evaluation  Patient identified by MRN, date of birth, ID band Patient awake    Reviewed: Allergy & Precautions, NPO status , Patient's Chart, lab work & pertinent test results  Airway Mallampati: II  TM Distance: >3 FB Neck ROM: Full    Dental no notable dental hx. (+) Teeth Intact   Pulmonary neg pulmonary ROS,    Pulmonary exam normal breath sounds clear to auscultation       Cardiovascular negative cardio ROS Normal cardiovascular exam Rhythm:Regular Rate:Normal     Neuro/Psych negative neurological ROS  negative psych ROS   GI/Hepatic negative GI ROS, Neg liver ROS,   Endo/Other  negative endocrine ROS  Renal/GU negative Renal ROS  negative genitourinary   Musculoskeletal negative musculoskeletal ROS (+)   Abdominal   Peds negative pediatric ROS (+)  Hematology negative hematology ROS (+)   Anesthesia Other Findings   Reproductive/Obstetrics negative OB ROS                            Anesthesia Physical Anesthesia Plan  ASA: II  Anesthesia Plan: General   Post-op Pain Management:    Induction: Intravenous  Airway Management Planned: Oral ETT  Additional Equipment:   Intra-op Plan:   Post-operative Plan: Extubation in OR  Informed Consent: I have reviewed the patients History and Physical, chart, labs and discussed the procedure including the risks, benefits and alternatives for the proposed anesthesia with the patient or authorized representative who has indicated his/her understanding and acceptance.   Dental advisory given  Plan Discussed with: CRNA and Surgeon  Anesthesia Plan Comments:         Anesthesia Quick Evaluation

## 2016-09-13 NOTE — Anesthesia Postprocedure Evaluation (Signed)
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  Procedure(s) Performed: Procedure(s) (LRB): TOTAL THYROIDECTOMY (N/A)  Patient location during evaluation: PACU Anesthesia Type: General Level of consciousness: awake and alert Pain management: pain level controlled Vital Signs Assessment: post-procedure vital signs reviewed and stable Respiratory status: spontaneous breathing, nonlabored ventilation, respiratory function stable and patient connected to nasal cannula oxygen Cardiovascular status: blood pressure returned to baseline and stable Postop Assessment: no signs of nausea or vomiting Anesthetic complications: no    Last Vitals:  Vitals:   09/13/16 1118 09/13/16 1130  BP: 129/82 121/82  Pulse: 75 82  Resp: 13 17  Temp: 36.5 C     Last Pain:  Vitals:   09/13/16 1118  TempSrc:   PainSc: Asleep                 Marshia Tropea S

## 2016-09-13 NOTE — Op Note (Signed)
DATE OF PROCEDURE:  09/13/2016                              OPERATIVE REPORT  SURGEON:  Leta Baptist, MD  PREOPERATIVE DIAGNOSES: 1. Right thyroid mass.  POSTOPERATIVE DIAGNOSES: 1. Right thyroid mass.  PROCEDURE PERFORMED:  Total thyroidectomy  ANESTHESIA:  General endotracheal tube anesthesia.  COMPLICATIONS:  None.  ESTIMATED BLOOD LOSS:  50 ml.  INDICATION FOR PROCEDURE:  Sophia Moore is a 35 y.o. female with a history of a right thyroid nodule. 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 The patient was evaluated by her endocrinologist. Total thyroidectomy surgery was recommended. The risks, benefits, alternatives, and details of the procedure were discussed with the patient.  Questions were invited and answered.  Informed consent was obtained.  DESCRIPTION:  The patient was taken to the operating room and placed supine on the operating table.  General endotracheal tube anesthesia was administered by the anesthesiologist.  The patient was positioned and prepped and draped in the standard fashion for thyroidectomy surgery. NIMS endotracheal tube was placed. The NIMS system was used to monitor the patient's recurrent and superior laryngeal nerves. The NIMS system was functional throughout the case.   2% lidocaine with 1-100,000 epinephrine was infiltrated at the planned site of incision. A lower neck transverse incision was made. The incision was carried down to the level of the platysma muscles. Superiorly- and inferiorly-based subplatysmal flaps were elevated in a standard fashion. The strap muscles were divided at midline and retracted laterally, exposing the thyroid gland. Attention was first focused on the right thyroid gland. A palpable right thyroid nodule was noted. The entire right thyroid gland was carefully dissected free from the surrounding soft tissue. An inferior parathyroid gland was identified and preserved. The right recurrent laryngeal  nerve and the external branch of the right superior laryngeal nerve were also identified and preserved. Hemostasis was achieved with a combination of suture ligations, Harmonic scalpels, and bipolar cautery. The same procedure was repeated on the left side without exception. The left recurrent laryngeal nerve and the external branch of the left superior laryngeal nerve were also identified and preserved.  The entire gland was removed and sent to the pathology department for permanent histologic identification. A #10 JP drain was placed.  The incision was closed in layers with 4-0 Vicryl and Dermabond.   The care of the patient was turned over to the anesthesiologist.  The patient was awakened from anesthesia without difficulty.  The patient was extubated and transferred to the recovery room in good condition.  OPERATIVE FINDINGS: Right thyroid follicular neoplasm.  SPECIMEN: Total thyroidectomy specimen.  FOLLOWUP CARE:  The patient will be observed overnight in the hospital. Her calcium level will be monitored. She will be discharged home once her calcium level is stable.   Monika Chestang,SUI W 09/13/2016 11:12 AM

## 2016-09-13 NOTE — Transfer of Care (Signed)
Immediate Anesthesia Transfer of Care Note  Patient: Sophia Moore  Procedure(s) Performed: Procedure(s): TOTAL THYROIDECTOMY (N/A)  Patient Location: PACU  Anesthesia Type:General  Level of Consciousness: sedated  Airway & Oxygen Therapy: Patient Spontanous Breathing and Patient connected to nasal cannula oxygen  Post-op Assessment: Report given to RN, Post -op Vital signs reviewed and stable and Patient moving all extremities X 4  Post vital signs: Reviewed and stable  Last Vitals:  Vitals:   09/13/16 0704 09/13/16 1118  BP: 123/76 129/82  Pulse: 70 75  Resp: 18 (!) 44  Temp: 36.8 C 36.5 C    Last Pain:  Vitals:   09/13/16 1118  TempSrc:   PainSc: (P) Asleep      Patients Stated Pain Goal: 6 ([id number])  Complications: No apparent anesthesia complications

## 2016-09-13 NOTE — Anesthesia Procedure Notes (Signed)
Procedure Name: Intubation Date/Time: 09/13/2016 8:27 AM Performed by: Kyung Rudd Pre-anesthesia Checklist: Patient identified, Emergency Drugs available, Suction available and Patient being monitored Patient Re-evaluated:Patient Re-evaluated prior to inductionOxygen Delivery Method: Circle system utilized Preoxygenation: Pre-oxygenation with 100% oxygen Intubation Type: IV induction Ventilation: Mask ventilation without difficulty Laryngoscope Size: Mac and 3 Grade View: Grade I Tube type: Oral Tube size: 7.0 mm Number of attempts: 1 Airway Equipment and Method: Stylet Placement Confirmation: ETT inserted through vocal cords under direct vision,  positive ETCO2 and breath sounds checked- equal and bilateral Tube secured with: Tape Dental Injury: Teeth and Oropharynx as per pre-operative assessment

## 2016-09-14 ENCOUNTER — Encounter (HOSPITAL_COMMUNITY): Payer: Self-pay | Admitting: Otolaryngology

## 2016-09-14 DIAGNOSIS — D34 Benign neoplasm of thyroid gland: Secondary | ICD-10-CM | POA: Diagnosis not present

## 2016-09-14 LAB — CALCIUM: Calcium: 9.4 mg/dL (ref 8.9–10.3)

## 2016-09-14 MED ORDER — CALCIUM CARBONATE-VITAMIN D 500-200 MG-UNIT PO TABS: 1.0000 | ORAL_TABLET | Freq: Two times a day (BID) | ORAL | 0 refills | Status: DC

## 2016-09-14 MED ORDER — LEVOTHYROXINE SODIUM 75 MCG PO TABS: 75.0000 ug | ORAL_TABLET | Freq: Every day | ORAL | 12 refills | Status: DC

## 2016-09-14 NOTE — Discharge Summary (Signed)
Physician Discharge Summary  Patient ID: Sophia Moore MRN: [id number] DOB/AGE: 1981/05/26 34 y.o.  Admit date: 09/13/2016 Discharge date: 09/14/2016  Admission Diagnoses: Right thyroid mass  Discharge Diagnoses: Right thyroid mass Active Problems:   H/O total thyroidectomy   Discharged Condition: good  Hospital Course: Pt had an uneventful overnight stay. Pt tolerated po well. No bleeding. No stridor.  Consults: None  Significant Diagnostic Studies: labs: Calcium level - stable  Treatments: surgery: Total thyroidectomy  Discharge Exam: Blood pressure 99/61, pulse 68, temperature 98.3 F (36.8 C), temperature source Oral, resp. rate 18, height 5\' 1"  (1.549 m), weight 120 lb (54.4 kg), SpO2 97 %. Incision/Wound:c/d/i  Disposition: 01-Home or Self Care  Discharge Instructions    Activity as tolerated - No restrictions    Complete by:  As directed    Diet general    Complete by:  As directed        Medication List    TAKE these medications   acetaminophen 325 MG tablet Commonly known as:  TYLENOL Take 325 mg by mouth daily as needed for moderate pain or headache.   calcium-vitamin D 500-200 MG-UNIT tablet Commonly known as:  OSCAL WITH D Take 1 tablet by mouth 2 (two) times daily.   citalopram 40 MG tablet Commonly known as:  CELEXA Take 40 mg by mouth daily.   clindamycin 300 MG capsule Commonly known as:  CLEOCIN Take 1 capsule (300 mg total) by mouth 3 (three) times daily.   oxyCODONE-acetaminophen 5-325 MG tablet Commonly known as:  ROXICET Take 1-2 tablets by mouth every 4 (four) hours as needed for severe pain.      Follow-up Information    Ascencion Dike, MD Follow up on 09/21/2016.   Specialty:  Otolaryngology Why:  at 4:20pm Contact information: [address] [place] [address] [phone]           Signed: Ascencion Dike 09/14/2016, 7:29 AM

## 2016-09-14 NOTE — Discharge Instructions (Signed)
Thyroidectomy, Care After Refer to this sheet in the next few weeks. These instructions provide you with information about caring for yourself after your procedure. Your health care provider may also give you more specific instructions. Your treatment has been planned according to current medical practices, but problems sometimes occur. Call your health care provider if you have any problems or questions after your procedure. What can I expect after the procedure? After your procedure, it is typical to have:  Mild pain in the neck or upper body, especially when swallowing.  A sore throat.  A weak voice. Follow these instructions at home:  Take medicines only as directed by your health care provider.  If your entire thyroid gland was removed, you may need to take thyroid hormone medicine from now on.  Do not take medicines that contain aspirin and ibuprofen until your health care provider says that you can. These medicines can increase your risk of bleeding.  Some pain medicines cause constipation. Drink enough fluid to keep your urine clear or pale yellow. This can help to prevent constipation.  Start slowly with eating. You may need to have only liquids and soft foods for a few days or as directed by your health care provider.  Resume your usual activities slowly over the next week.  For the first 10 days after the procedure or as instructed by your health care provider:  Do not lift anything heavier than 20 lb (9.1 kg).  Do not jog, swim, or do other strenuous exercises.  Do not play contact sports.  Keep all follow-up visits as directed by your health care provider. This is important. Contact a health care provider if:  The soreness in your throat gets worse.  You have increased pain at your incision or incisions.  You have increased bleeding from an incision.  Your incision becomes infected. Watch for:  Swelling.  Redness.  Warmth.  Pus.  You notice a bad smell  coming from an incision or dressing.  You have a fever.  You feel lightheaded or faint.  You have numbness, tingling, or muscle spasms in your:  Arms.  Hands.  Feet.  Face.  You have trouble swallowing. Get help right away if:  You develop a rash.  You have difficulty breathing.  You hear whistling noises coming from your chest.  You develop a cough that gets worse.  Your speech changes, or you have hoarseness that gets worse. This information is not intended to replace advice given to you by your health care provider. Make sure you discuss any questions you have with your health care provider. Document Released: 04/14/2005 Document Revised: 05/28/2016 Document Reviewed: 02/25/2014 Elsevier Interactive Patient Education  2017 [employer].

## 2016-09-14 NOTE — Progress Notes (Signed)
Discussed discharge summary with patient. Reviewed all medications with patient. Patient received prescriptions. Patient ready for discharge. Patient questions answered appropriately.

## 2016-09-21 ENCOUNTER — Ambulatory Visit (INDEPENDENT_AMBULATORY_CARE_PROVIDER_SITE_OTHER): Payer: BC Managed Care – PPO | Admitting: Otolaryngology

## 2016-10-16 ENCOUNTER — Other Ambulatory Visit: Payer: Self-pay | Admitting: "Endocrinology

## 2016-10-17 LAB — T4, FREE: FREE T4: 1.1 ng/dL (ref 0.8–1.8)

## 2016-10-17 LAB — TSH: TSH: 2.43 mIU/L

## 2016-10-20 ENCOUNTER — Ambulatory Visit (INDEPENDENT_AMBULATORY_CARE_PROVIDER_SITE_OTHER): Payer: BC Managed Care – PPO | Admitting: "Endocrinology

## 2016-10-20 ENCOUNTER — Encounter: Payer: Self-pay | Admitting: "Endocrinology

## 2016-10-20 VITALS — BP 107/72 | HR 75 | Ht 61.0 in | Wt 124.0 lb

## 2016-10-20 DIAGNOSIS — E890 Postprocedural hypothyroidism: Secondary | ICD-10-CM | POA: Diagnosis not present

## 2016-10-20 MED ORDER — LEVOTHYROXINE SODIUM 88 MCG PO TABS: 88.0000 ug | ORAL_TABLET | Freq: Every day | ORAL | 6 refills | Status: DC

## 2016-10-20 NOTE — Progress Notes (Signed)
Subjective:    Patient ID: Sophia Moore, female    DOB: [birth year], PCP [facility]   Past Medical History:  Diagnosis Date  . Depression    takes Citalopram daily  . Thyroid nodule 07/2016   Past Surgical History:  Procedure Laterality Date  . THYROIDECTOMY  09/13/2016  . THYROIDECTOMY N/A 09/13/2016   Procedure: TOTAL THYROIDECTOMY;  Surgeon: Leta Baptist, MD;  Location: [facility] OR;  Service: ENT;  Laterality: N/A;  . wisdom teeth extracted     . WISDOM TOOTH EXTRACTION     Social History   Social History  . Marital status: Divorced    Spouse name: N/A  . Number of children: N/A  . Years of education: N/A   Social History Main Topics  . Smoking status: Never Smoker  . Smokeless tobacco: Never Used  . Alcohol use No  . Drug use: No  . Sexual activity: Not Currently    Birth control/ protection: IUD   Other Topics Concern  . None   Social History Narrative  . None   Outpatient Encounter Prescriptions as of 10/20/2016  Medication Sig  . acetaminophen (TYLENOL) 325 MG tablet Take 325 mg by mouth daily as needed for moderate pain or headache.  . calcium-vitamin D (OSCAL WITH D) 500-200 MG-UNIT tablet Take 1 tablet by mouth 2 (two) times daily.  . citalopram (CELEXA) 40 MG tablet Take 40 mg by mouth daily.  Marland Kitchen levothyroxine (SYNTHROID, LEVOTHROID) 88 MCG tablet Take 1 tablet (88 mcg total) by mouth daily before breakfast.  . [DISCONTINUED] levothyroxine (SYNTHROID) 75 MCG tablet Take 1 tablet (75 mcg total) by mouth daily before breakfast.  . [DISCONTINUED] oxyCODONE-acetaminophen (ROXICET) 5-325 MG tablet Take 1-2 tablets by mouth every 4 (four) hours as needed for severe pain.   No facility-administered encounter medications on file as of 10/20/2016.    ALLERGIES: Allergies  Allergen Reactions  . Amoxicillin Rash  . Penicillins Rash    Has patient had a PCN reaction causing immediate rash, facial/tongue/throat swelling, SOB  or lightheadedness with hypotension: Yes Has patient had a PCN reaction causing severe rash involving mucus membranes or skin necrosis: No Has patient had a PCN reaction that required hospitalization No Has patient had a PCN reaction occurring within the last 10 years: Yes If all of the above answers are "NO", then may proceed with Cephalosporin use.    VACCINATION STATUS:  There is no immunization history on file for this patient.  HPI 36 year old female patient with medical history as above. She is being seen in Follow-up after total thyroidectomy due to abnormal FNA. - Her surgical pathology was negative for malignancy. She is currently on levothyroxine 75 g by mouth every morning.  - She denies family history of thyroid dysfunction. She denies exposure to neck radiation. She denies heat/cold intolerance. She has steady body weight. Denies dysphagia, shortness of breath, nor voice change.  Review of Systems  Constitutional: + weight gain of 4 pounds, no fatigue, no subjective hyperthermia/hypothermia Eyes: no blurry vision, no xerophthalmia ENT: no sore throat, no nodules palpated in throat, no dysphagia/odynophagia, no hoarseness, she has goiter. Cardiovascular: no CP/SOB/palpitations/leg swelling Respiratory: no cough/SOB Gastrointestinal: no N/V/D/C Musculoskeletal: no muscle/joint aches Skin: no rashes Neurological: no tremors/numbness/tingling/dizziness Psychiatric: no depression/anxiety  Objective:    BP 107/72   Pulse 75   Ht 5\' 1"  (1.549 m)   Wt 124 lb (56.2 kg)   BMI 23.43 kg/m   Wt Readings from Last 3 Encounters:  10/20/16 124 lb (56.2 kg)  08/08/16 120 lb (54.4 kg)  09/11/16 121 lb 7.6 oz (55.1 kg)    Physical Exam Constitutional:  in NAD Eyes: PERRLA, EOMI, no exophthalmos ENT: moist mucous membranes, + healing post thyroidectomy scar, no cervical lymphadenopathy Cardiovascular: RRR, No MRG Respiratory: CTA B Gastrointestinal: abdomen soft, NT, ND,  BS+ Musculoskeletal: no deformities, strength intact in all 4 Skin: moist, warm, no rashes Neurological: no tremor with outstretched hands, DTR normal in all 4  09/13/2016 surgical pathology of total thyroidectomy-no malignancy. Thyroid, thyroidectomy, Right - FOLLICULAR ADENOMA WITH CALCIFICATIONS, 2.1 CM. - MICROSCOPIC INTRATHYROIDAL PARATHYROID TISSUE. - THERE IS NO EVIDENCE OF MALIGNANCY.  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 Her surgical sample diagnosis is the same with no evidence of malignancy. She will not require radioactive iodine ablative therapy. She will need repeat thyroid/neck ultrasound in 1 year.  -Regarding her postsurgical hypothyroidism, she would benefit from slight increase in her levothyroxine. I will increase Levothyroxine to 88 g by mouth every morning.  - We discussed about correct intake of levothyroxine, at fasting, with water, separated by at least 30 minutes from breakfast, and separated by more than 4 hours from calcium, iron, multivitamins, acid reflux medications (PPIs). -Patient is made aware of the fact that thyroid hormone replacement is needed for life, dose to be adjusted by periodic monitoring of thyroid function tests.  - Her calcium was 9.4 at discharge and she was advised to stop calcium supplements by her surgeon. I will check CMP along with TSH/free T4 before her next visit.  - I advised patient to maintain close follow up with [facility] for primary care needs. Follow up plan: Return for follow up with pre-visit labs.  Glade Lloyd, MD Phone: [phone]  Fax: [phone]   10/20/2016, 9:12 AM

## 2016-11-29 ENCOUNTER — Telehealth: Payer: Self-pay | Admitting: "Endocrinology

## 2016-11-29 MED ORDER — LEVOTHYROXINE SODIUM 88 MCG PO TABS: 88.0000 ug | ORAL_TABLET | Freq: Every day | ORAL | 3 refills | Status: DC

## 2016-11-29 NOTE — Telephone Encounter (Signed)
Patient LMOM stating she went to pharmacy to get a refill and they told it was denied and to contact Dr.Nida. Please call patient.

## 2017-03-27 ENCOUNTER — Other Ambulatory Visit: Payer: Self-pay | Admitting: "Endocrinology

## 2017-03-29 ENCOUNTER — Ambulatory Visit (INDEPENDENT_AMBULATORY_CARE_PROVIDER_SITE_OTHER): Payer: BC Managed Care – PPO | Admitting: Otolaryngology

## 2017-03-29 DIAGNOSIS — D440 Neoplasm of uncertain behavior of thyroid gland: Secondary | ICD-10-CM | POA: Diagnosis not present

## 2017-04-10 LAB — LIPID PANEL
Cholesterol: 172 (ref 0–200)
HDL: 48 (ref 35–70)
LDL CALC: 96
TRIGLYCERIDES: 124 (ref 40–160)

## 2017-04-12 LAB — TSH: TSH: 1.76 (ref <=5.90)

## 2017-04-19 ENCOUNTER — Ambulatory Visit: Payer: BC Managed Care – PPO | Admitting: "Endocrinology

## 2017-05-15 ENCOUNTER — Encounter: Payer: Self-pay | Admitting: "Endocrinology

## 2017-05-15 ENCOUNTER — Ambulatory Visit (INDEPENDENT_AMBULATORY_CARE_PROVIDER_SITE_OTHER): Payer: BC Managed Care – PPO | Admitting: "Endocrinology

## 2017-05-15 VITALS — BP 94/63 | HR 72 | Wt 122.0 lb

## 2017-05-15 DIAGNOSIS — E890 Postprocedural hypothyroidism: Secondary | ICD-10-CM | POA: Diagnosis not present

## 2017-05-15 MED ORDER — LEVOTHYROXINE SODIUM 88 MCG PO TABS: ORAL_TABLET | ORAL | 6 refills | Status: DC

## 2017-05-15 NOTE — Progress Notes (Signed)
Subjective:    Patient ID: Sophia Moore, female    DOB: 1981-07-21, PCP [facility]   Past Medical History:  Diagnosis Date  . Depression    takes Citalopram daily  . Thyroid nodule 07/2016   Past Surgical History:  Procedure Laterality Date  . THYROIDECTOMY  09/13/2016  . THYROIDECTOMY N/A 09/13/2016   Procedure: TOTAL THYROIDECTOMY;  Surgeon: Leta Baptist, MD;  Location: [facility] OR;  Service: ENT;  Laterality: N/A;  . wisdom teeth extracted     . WISDOM TOOTH EXTRACTION     Social History   Social History  . Marital status: Divorced    Spouse name: N/A  . Number of children: N/A  . Years of education: N/A   Social History Main Topics  . Smoking status: Never Smoker  . Smokeless tobacco: Never Used  . Alcohol use No  . Drug use: No  . Sexual activity: Not Currently    Birth control/ protection: IUD   Other Topics Concern  . None   Social History Narrative  . None   Outpatient Encounter Prescriptions as of 05/15/2017  Medication Sig  . acetaminophen (TYLENOL) 325 MG tablet Take 325 mg by mouth daily as needed for moderate pain or headache.  . citalopram (CELEXA) 40 MG tablet Take 40 mg by mouth daily.  Marland Kitchen levothyroxine (SYNTHROID, LEVOTHROID) 88 MCG tablet TAKE 1 TABLET BY MOUTH EVERY DAY BEFORE BREAKFAST  . [DISCONTINUED] calcium-vitamin D (OSCAL WITH D) 500-200 MG-UNIT tablet Take 1 tablet by mouth 2 (two) times daily.  . [DISCONTINUED] levothyroxine (SYNTHROID, LEVOTHROID) 88 MCG tablet TAKE 1 TABLET BY MOUTH EVERY DAY BEFORE BREAKFAST   No facility-administered encounter medications on file as of 05/15/2017.    ALLERGIES: Allergies  Allergen Reactions  . Amoxicillin Rash  . Penicillins Rash    Has patient had a PCN reaction causing immediate rash, facial/tongue/throat swelling, SOB or lightheadedness with hypotension: Yes Has patient had a PCN reaction causing severe rash involving mucus membranes or skin necrosis:  No Has patient had a PCN reaction that required hospitalization No Has patient had a PCN reaction occurring within the last 10 years: Yes If all of the above answers are "NO", then may proceed with Cephalosporin use.    VACCINATION STATUS:  There is no immunization history on file for this patient.  HPI 36 year old female patient with medical history as above. She is being seen in Follow-up after total thyroidectomy on 09/13/2016, due to abnormal FNA. - Her surgical pathology was negative for malignancy. She is currently on levothyroxine 88 g by mouth every morning.  - She denies family history of thyroid dysfunction. She denies exposure to neck radiation. She denies heat/cold intolerance. She has steady body weight. Denies dysphagia, shortness of breath, nor voice change.  Review of Systems  Constitutional:  + Steady weight, no fatigue, no subjective hyperthermia/hypothermia Eyes: no blurry vision, no xerophthalmia ENT: no sore throat, no nodules palpated in throat, no dysphagia/odynophagia, no hoarseness, she has goiter. Cardiovascular: no CP/SOB/palpitations/leg swelling Respiratory: no cough/SOB Gastrointestinal: no N/V/D/C Musculoskeletal: no muscle/joint aches Skin: no rashes Neurological: no tremors/numbness/tingling/dizziness Psychiatric: + depression on Celexa, -anxiety  Objective:    BP 94/63   Pulse 72   Wt 122 lb (55.3 kg)   SpO2 99%   BMI 23.05 kg/m   Wt Readings from Last 3 Encounters:  05/15/17 122 lb (55.3 kg)  10/20/16 124 lb (56.2 kg)  08/08/16 120 lb (54.4 kg)    Physical Exam Constitutional:  in NAD Eyes: PERRLA, EOMI, no exophthalmos ENT: moist mucous membranes, + healing post thyroidectomy scar, no cervical lymphadenopathy Cardiovascular: RRR, No MRG Respiratory: CTA B Gastrointestinal: abdomen soft, NT, ND, BS+ Musculoskeletal: no deformities, strength intact in all 4 Skin: moist, warm, no rashes Neurological: no tremor with outstretched  hands, DTR normal in all 4 Recent Results (from the past 2160 hour(s))  TSH     Status: None   Collection Time: 04/12/17 12:00 AM  Result Value Ref Range   TSH 1.76 0.41 - 5.90    Comment: free t4 1.0   Recent Results (from the past 2160 hour(s))  Lipid panel     Status: None   Collection Time: 04/10/17 12:00 AM  Result Value Ref Range   Triglycerides 124 40 - 160   Cholesterol 172 0 - 200   HDL 48 35 - 70   LDL Cholesterol 96   TSH     Status: None   Collection Time: 04/12/17 12:00 AM  Result Value Ref Range   TSH 1.76 0.41 - 5.90    Comment: free t4 1.0      09/13/2016 surgical pathology of total thyroidectomy-no malignancy. Thyroid, thyroidectomy, Right - FOLLICULAR ADENOMA WITH CALCIFICATIONS, 2.1 CM. - MICROSCOPIC INTRATHYROIDAL PARATHYROID TISSUE. - THERE IS NO EVIDENCE OF MALIGNANCY.  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 Her surgical sample diagnosis is the same with no evidence of malignancy. She will not require radioactive iodine ablative therapy. She will need repeat thyroid/neck ultrasound in 1 year.  -Regarding her postsurgical hypothyroidism, Her thyroid function tests are consistent with appropriate replacement with levothyroxine 88 g by mouth every morning.  - We discussed about correct intake of levothyroxine, at fasting, with water, separated by at least 30 minutes from breakfast, and separated by more than 4 hours from calcium, iron, multivitamins, acid reflux medications (PPIs). -Patient is made aware of the fact that thyroid hormone replacement is needed for life, dose to be adjusted by periodic monitoring of thyroid function tests.   - I  advised patient to maintain close follow up with [facility] for primary care needs. Follow up plan: Return in about 6 months (around 11/15/2017) for follow up with pre-visit labs.  Glade Lloyd, MD Phone: [phone]  Fax: [phone]   05/15/2017, 10:12 AM

## 2017-07-26 ENCOUNTER — Other Ambulatory Visit: Payer: Self-pay | Admitting: "Endocrinology

## 2017-11-01 ENCOUNTER — Other Ambulatory Visit: Payer: Self-pay | Admitting: Family Medicine

## 2017-11-03 ENCOUNTER — Other Ambulatory Visit: Payer: Self-pay | Admitting: Physician Assistant

## 2017-11-03 DIAGNOSIS — M79641 Pain in right hand: Secondary | ICD-10-CM

## 2017-11-03 DIAGNOSIS — S6991XA Unspecified injury of right wrist, hand and finger(s), initial encounter: Secondary | ICD-10-CM

## 2017-11-15 ENCOUNTER — Other Ambulatory Visit: Payer: Self-pay | Admitting: "Endocrinology

## 2017-11-15 ENCOUNTER — Ambulatory Visit: Payer: BC Managed Care – PPO | Admitting: "Endocrinology

## 2017-11-15 DIAGNOSIS — E039 Hypothyroidism, unspecified: Secondary | ICD-10-CM

## 2017-11-15 LAB — TSH: TSH: 0.26 mIU/L — ABNORMAL LOW

## 2017-11-15 LAB — T4, FREE: Free T4: 1.3 ng/dL (ref 0.8–1.8)

## 2017-11-26 ENCOUNTER — Ambulatory Visit (INDEPENDENT_AMBULATORY_CARE_PROVIDER_SITE_OTHER): Payer: BC Managed Care – PPO | Admitting: "Endocrinology

## 2017-11-26 ENCOUNTER — Encounter: Payer: Self-pay | Admitting: "Endocrinology

## 2017-11-26 VITALS — BP 106/71 | HR 66 | Ht 61.0 in | Wt 121.0 lb

## 2017-11-26 DIAGNOSIS — E890 Postprocedural hypothyroidism: Secondary | ICD-10-CM | POA: Diagnosis not present

## 2017-11-26 DIAGNOSIS — D497 Neoplasm of unspecified behavior of endocrine glands and other parts of nervous system: Secondary | ICD-10-CM | POA: Diagnosis not present

## 2017-11-26 MED ORDER — LEVOTHYROXINE SODIUM 88 MCG PO TABS: ORAL_TABLET | ORAL | 6 refills | Status: DC

## 2017-11-26 NOTE — Progress Notes (Signed)
Subjective:    Patient ID: Sophia Moore, female    DOB: 16-Apr-1981, PCP [facility]   Past Medical History:  Diagnosis Date  . Depression    takes Citalopram daily  . Thyroid nodule 07/2016   Past Surgical History:  Procedure Laterality Date  . THYROIDECTOMY  09/13/2016  . THYROIDECTOMY N/A 09/13/2016   Procedure: TOTAL THYROIDECTOMY;  Surgeon: Leta Baptist, MD;  Location: [facility] OR;  Service: ENT;  Laterality: N/A;  . wisdom teeth extracted     . WISDOM TOOTH EXTRACTION     Social History   Socioeconomic History  . Marital status: Divorced    Spouse name: None  . Number of children: None  . Years of education: None  . Highest education level: None  Social Needs  . Financial resource strain: None  . Food insecurity - worry: None  . Food insecurity - inability: None  . Transportation needs - medical: None  . Transportation needs - non-medical: None  Occupational History  . None  Tobacco Use  . Smoking status: Never Smoker  . Smokeless tobacco: Never Used  Substance and Sexual Activity  . Alcohol use: No  . Drug use: No  . Sexual activity: Not Currently    Birth control/protection: IUD  Other Topics Concern  . None  Social History Narrative  . None   Outpatient Encounter Medications as of 11/26/2017  Medication Sig  . acetaminophen (TYLENOL) 325 MG tablet Take 325 mg by mouth daily as needed for moderate pain or headache.  . citalopram (CELEXA) 40 MG tablet Take 40 mg by mouth daily.  Marland Kitchen levothyroxine (SYNTHROID, LEVOTHROID) 88 MCG tablet TAKE 1 TABLET BY MOUTH EVERY DAY BEFORE BREAKFAST  . [DISCONTINUED] levothyroxine (SYNTHROID, LEVOTHROID) 88 MCG tablet TAKE 1 TABLET BY MOUTH EVERY DAY BEFORE BREAKFAST  . [DISCONTINUED] levothyroxine (SYNTHROID, LEVOTHROID) 88 MCG tablet TAKE 1 TABLET BY MOUTH EVERY DAY BEFORE BREAKFAST   No facility-administered encounter medications on file as of 11/26/2017.    ALLERGIES: Allergies   Allergen Reactions  . Amoxicillin Rash  . Penicillins Rash    Has patient had a PCN reaction causing immediate rash, facial/tongue/throat swelling, SOB or lightheadedness with hypotension: Yes Has patient had a PCN reaction causing severe rash involving mucus membranes or skin necrosis: No Has patient had a PCN reaction that required hospitalization No Has patient had a PCN reaction occurring within the last 10 years: Yes If all of the above answers are "NO", then may proceed with Cephalosporin use.    VACCINATION STATUS:  There is no immunization history on file for this patient.  HPI 37 year old female patient with medical history as above. 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 - Her surgical pathology was negative for malignancy. She is currently on levothyroxine 88 g by mouth every morning.  - She denies family history of thyroid dysfunction. She denies exposure to neck radiation. She denies heat/cold intolerance. She has steady body weight. Denies dysphagia, shortness of breath, nor voice change.  Review of Systems  Constitutional:  + Steady weight, no fatigue, no subjective hyperthermia/hypothermia Eyes: no blurry vision, no xerophthalmia ENT: no sore throat, no nodules palpated in throat, no dysphagia/odynophagia, no hoarseness, she has goiter. Cardiovascular: No chest pain, no palpitations, no leg swelling.  Respiratory: no cough/SOB Gastrointestinal: no N/V/D/C Musculoskeletal: no muscle/joint aches Skin: no rashes Neurological: no tremors/numbness/tingling/dizziness Psychiatric: + depression on Celexa, -anxiety  Objective:  BP 106/71   Pulse 66   Ht 5\' 1"  (1.549 m)   Wt 121 lb (54.9 kg)   BMI 22.86 kg/m   Wt Readings from Last 3 Encounters:  11/26/17 121 lb (54.9 kg)  05/15/17 122 lb (55.3 kg)  10/20/16 124 lb (56.2 kg)    Physical Exam Constitutional:  in NAD Eyes:  PERRLA, EOMI, no exophthalmos ENT: moist mucous membranes, + healed post thyroidectomy scar, no cervical lymphadenopathy Cardiovascular: RRR, No MRG Respiratory: CTA B Gastrointestinal: abdomen soft, NT, ND, BS+ Musculoskeletal: no deformities, strength intact in all 4 Skin: moist, warm, no rashes Neurological: No tremors of outstretched hands, DTR reflexes are normal on bilateral lower extremities.     Recent Results (from the past 2160 hour(s))  T4, Free     Status: None   Collection Time: 11/15/17  9:19 AM  Result Value Ref Range   Free T4 1.3 0.8 - 1.8 ng/dL  TSH     Status: Abnormal   Collection Time: 11/15/17  9:19 AM  Result Value Ref Range   TSH 0.26 (L) mIU/L    Comment:           Reference Range .           > or = 20 Years  0.40-4.50 .                Pregnancy Ranges           First trimester    0.26-2.66           Second trimester   0.55-2.73           Third trimester    0.43-2.91    Recent Results (from the past 2160 hour(s))  T4, Free     Status: None   Collection Time: 11/15/17  9:19 AM  Result Value Ref Range   Free T4 1.3 0.8 - 1.8 ng/dL  TSH     Status: Abnormal   Collection Time: 11/15/17  9:19 AM  Result Value Ref Range   TSH 0.26 (L) mIU/L    Comment:           Reference Range .           > or = 20 Years  0.40-4.50 .                Pregnancy Ranges           First trimester    0.26-2.66           Second trimester   0.55-2.73           Third trimester    0.43-2.91       09/13/2016 surgical pathology of total thyroidectomy-no malignancy. Thyroid, thyroidectomy, Right - FOLLICULAR ADENOMA WITH CALCIFICATIONS, 2.1 CM. - MICROSCOPIC INTRATHYROIDAL PARATHYROID TISSUE. - THERE IS NO EVIDENCE OF MALIGNANCY.  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  Assessment & Plan:  1. 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 Her surgical sample diagnosis is the same with no evidence of malignancy. She will not require radioactive iodine ablative therapy. She will need repeat thyroid/neck ultrasound before her next visit in 3 months.   -Regarding her postsurgical hypothyroidism, Her thyroid function tests are consistent with appropriate replacement with levothyroxine 88 g by mouth every morning.  -  We discussed about correct intake of levothyroxine, at fasting, with water, separated by at least 30 minutes from breakfast, and separated by more than 4 hours from calcium, iron, multivitamins, acid reflux medications (PPIs). -Patient is made aware of the fact that thyroid hormone replacement is needed for life, dose to be adjusted by periodic monitoring of thyroid function tests.   - I advised patient to maintain close follow up with [facility] for primary care needs. Follow up plan: Return in about 3 months (around 02/23/2018) for follow up with pre-visit labs, Thyroid / Neck Ultrasound.  Glade Lloyd, MD Phone: [phone]  Fax: [phone]  -  This note was partially dictated with voice recognition software. Similar sounding words can be transcribed inadequately or may not  be corrected upon review.  11/26/2017, 5:34 PM

## 2017-12-20 IMAGING — US US SOFT TISSUE HEAD/NECK
1 series · 14 of 25 positions shown · non-contrast
Comparison: None.

CLINICAL DATA: Nodule.

EXAM:
THYROID ULTRASOUND
TECHNIQUE: Ultrasound examination of the thyroid gland and adjacent soft
tissues was performed.

[Series 1: us soft tissue head/neck · 0.06mm/px · 14 of 57 slices shown]
[im 1/57]
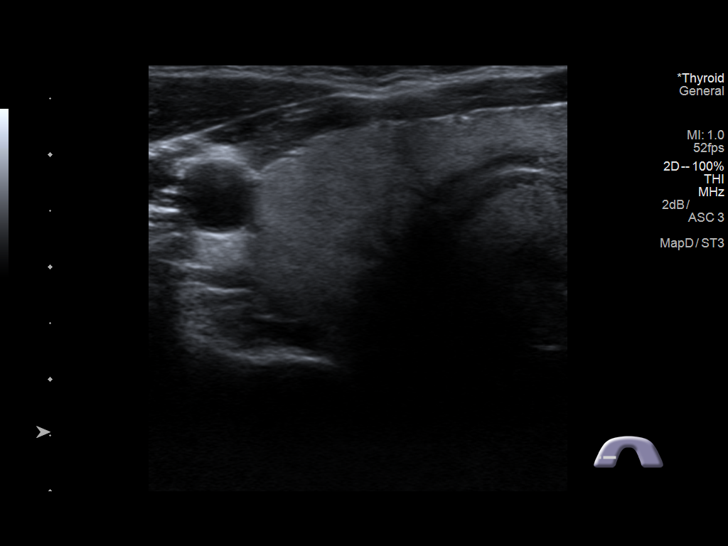
[im 5/57]
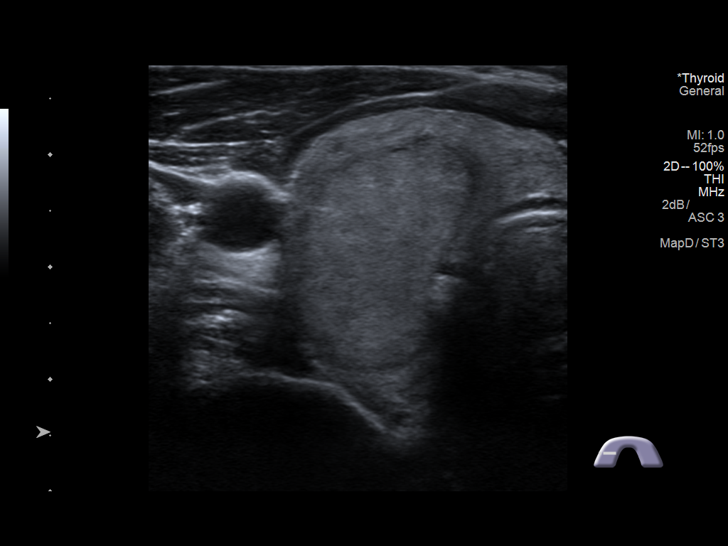
[im 10/57]
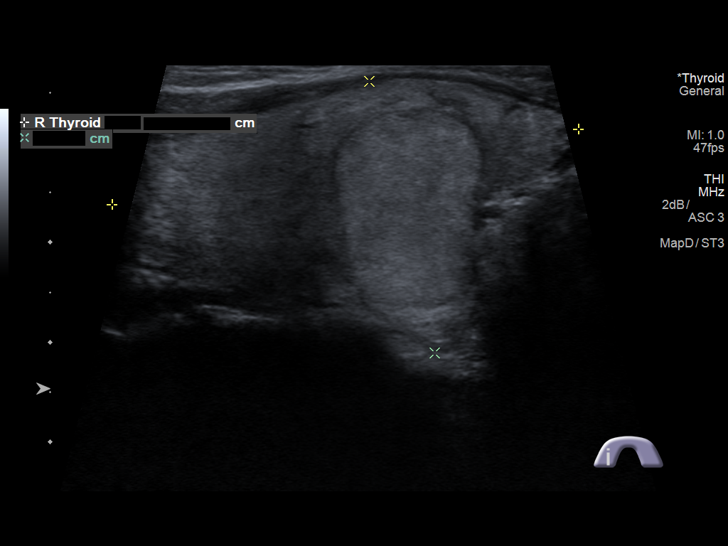
[im 15/57]
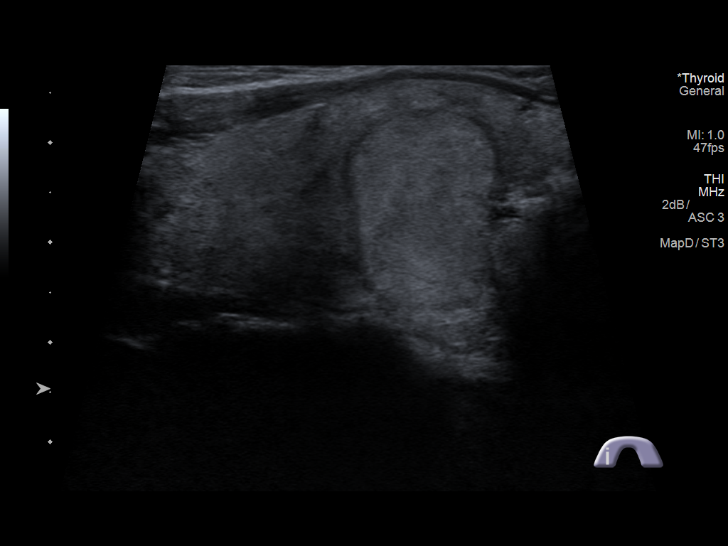
[im 19/57]
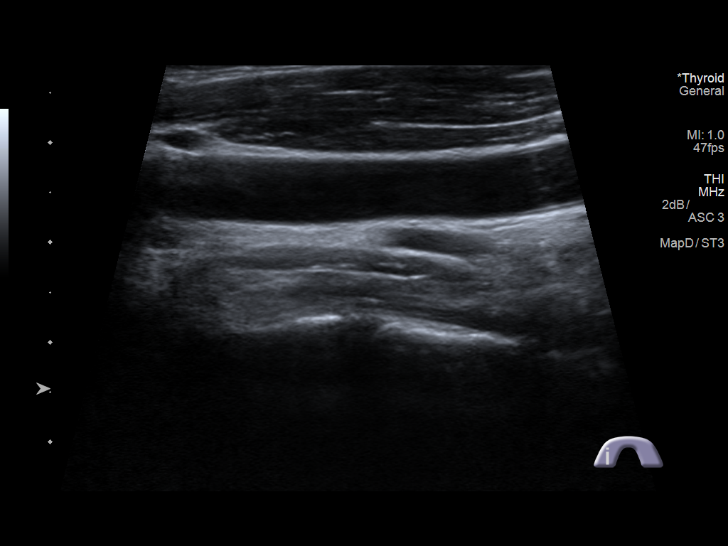
[im 22/57]
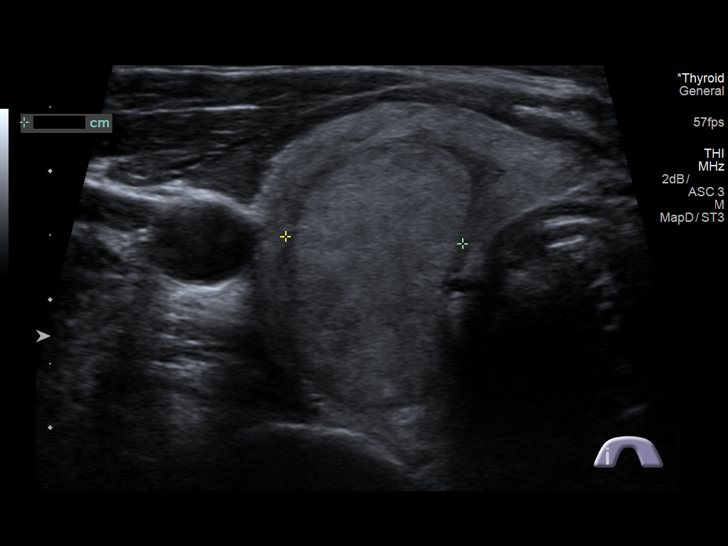
[im 26/57]
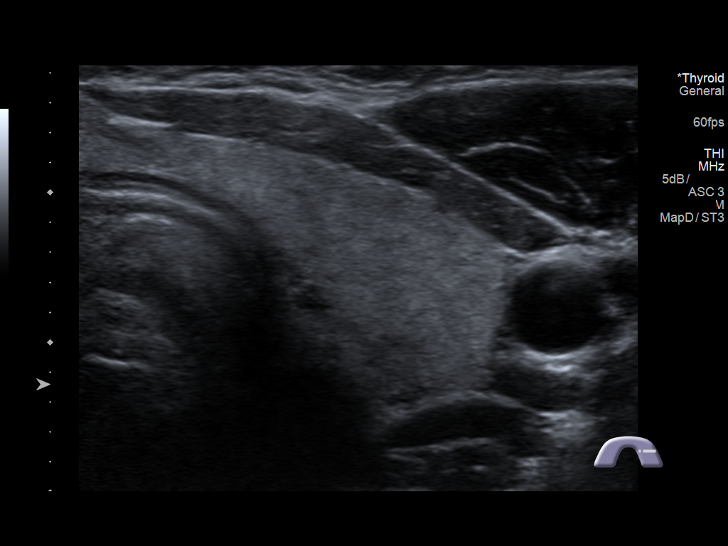
[im 31/57]
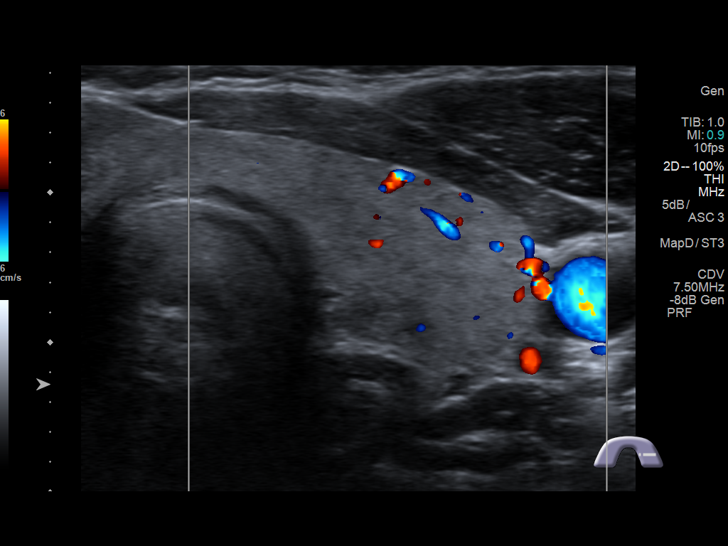
[im 36/57]
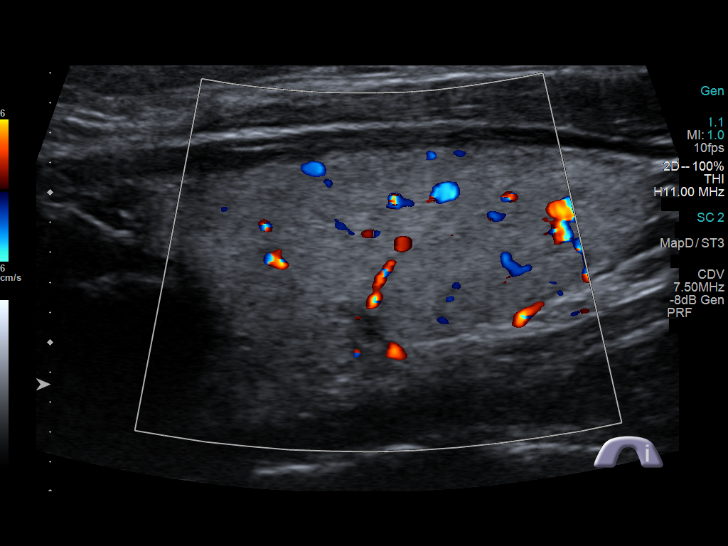
[im 38/57]
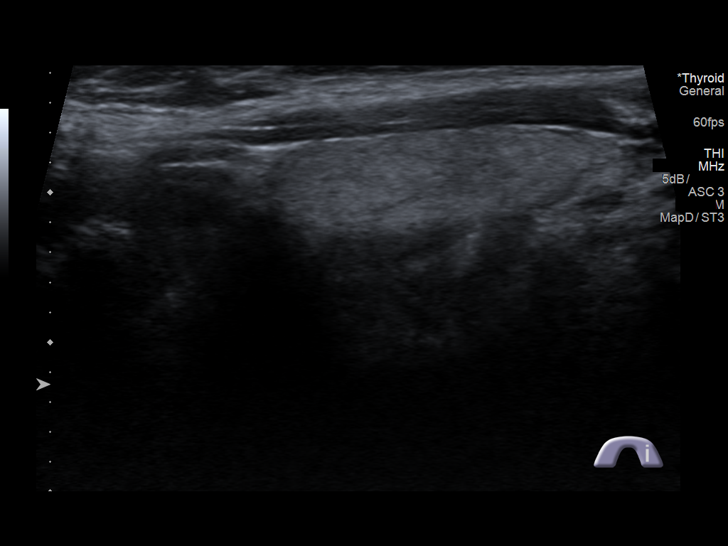
[im 43/57]
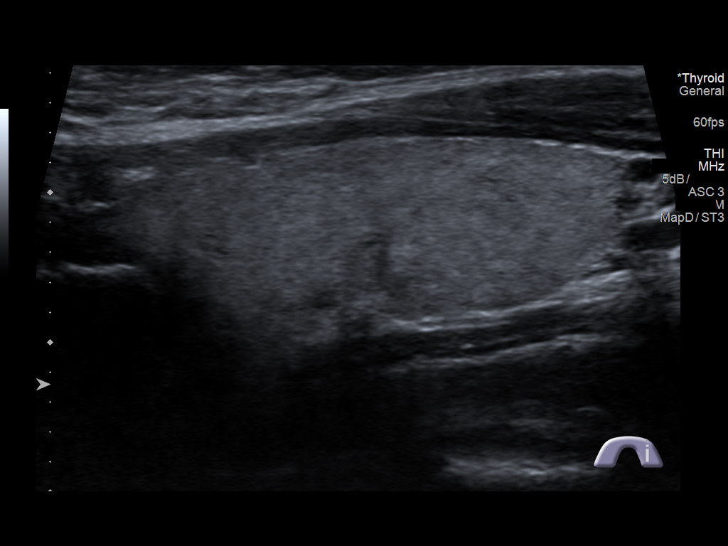
[im 47/57]
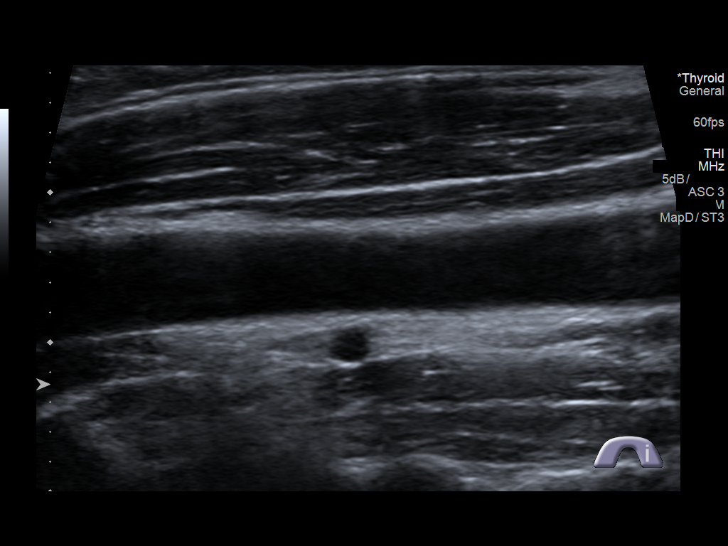
[im 52/57]
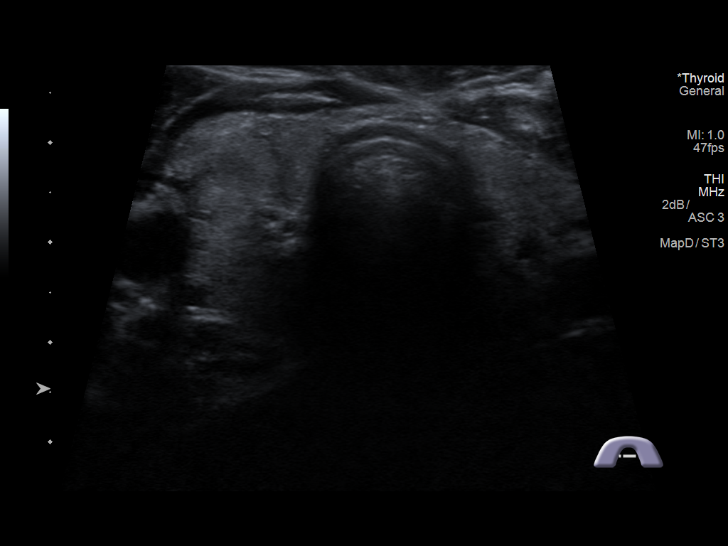
[im 57/57]
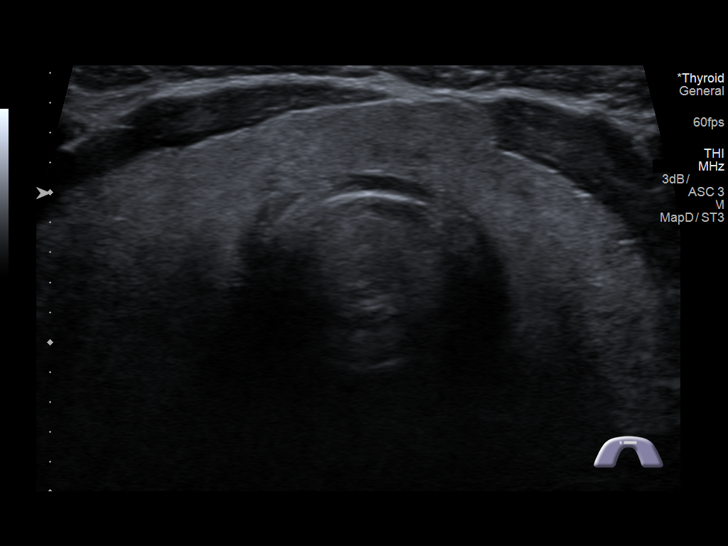

[14 of 25 positions shown; findings below may reference images not displayed]

FINDINGS: Right thyroid lobe

Measurements: 4.7 x 2.8 x 2 cm. Solitary 1.5 x 2.1 x 1.4 cm solid
nodule, mid lobe

Left thyroid lobe

Measurements: 3.8 x 1.2 x 1.7 cm.  No nodules visualized.

Isthmus

Thickness: 0.5 cm.  No nodules visualized.

Lymphadenopathy

None visualized.
IMPRESSION: 1. Solitary 2.1 cm right nodule. Findings meet consensus criteria
for biopsy. Ultrasound-guided fine needle aspiration should be
considered, as per the consensus statement: Management of Thyroid
Nodules Detected at US: Society of Radiologists in Ultrasound

## 2018-01-29 ENCOUNTER — Ambulatory Visit (HOSPITAL_COMMUNITY)
Admission: RE | Admit: 2018-01-29 | Discharge: 2018-01-29 | Disposition: A | Discharge status: Home / Self Care | Payer: BC Managed Care – PPO | Source: Ambulatory Visit | Attending: "Endocrinology | Admitting: "Endocrinology

## 2018-01-29 DIAGNOSIS — D497 Neoplasm of unspecified behavior of endocrine glands and other parts of nervous system: Secondary | ICD-10-CM | POA: Diagnosis not present

## 2018-01-29 LAB — TSH: TSH: 0.27 m[IU]/L — AB

## 2018-01-29 LAB — T4, FREE: Free T4: 1.2 ng/dL (ref 0.8–1.8)

## 2018-02-27 ENCOUNTER — Ambulatory Visit (INDEPENDENT_AMBULATORY_CARE_PROVIDER_SITE_OTHER): Payer: BC Managed Care – PPO | Admitting: "Endocrinology

## 2018-02-27 ENCOUNTER — Encounter: Payer: Self-pay | Admitting: "Endocrinology

## 2018-02-27 VITALS — BP 103/69 | HR 68 | Ht 61.0 in | Wt 119.0 lb

## 2018-02-27 DIAGNOSIS — E890 Postprocedural hypothyroidism: Secondary | ICD-10-CM

## 2018-02-27 MED ORDER — LEVOTHYROXINE SODIUM 88 MCG PO TABS: ORAL_TABLET | ORAL | 3 refills | Status: DC

## 2018-02-27 NOTE — Progress Notes (Signed)
Subjective:    Patient ID: Sophia Moore, female    DOB: Mar 10, 1981, PCP [facility]   Past Medical History:  Diagnosis Date  . Depression    takes Citalopram daily  . Thyroid nodule 07/2016   Past Surgical History:  Procedure Laterality Date  . THYROIDECTOMY  09/13/2016  . THYROIDECTOMY N/A 09/13/2016   Procedure: TOTAL THYROIDECTOMY;  Surgeon: Leta Baptist, MD;  Location: [facility] OR;  Service: ENT;  Laterality: N/A;  . wisdom teeth extracted     . WISDOM TOOTH EXTRACTION     Social History   Socioeconomic History  . Marital status: Divorced    Spouse name: Not on file  . Number of children: Not on file  . Years of education: Not on file  . Highest education level: Not on file  Occupational History  . Not on file  Social Needs  . Financial resource strain: Not on file  . Food insecurity:    Worry: Not on file    Inability: Not on file  . Transportation needs:    Medical: Not on file    Non-medical: Not on file  Tobacco Use  . Smoking status: Never Smoker  . Smokeless tobacco: Never Used  Substance and Sexual Activity  . Alcohol use: No  . Drug use: No  . Sexual activity: Not Currently    Birth control/protection: IUD  Lifestyle  . Physical activity:    Days per week: Not on file    Minutes per session: Not on file  . Stress: Not on file  Relationships  . Social connections:    Talks on phone: Not on file    Gets together: Not on file    Attends religious service: Not on file    Active member of club or organization: Not on file    Attends meetings of clubs or organizations: Not on file    Relationship status: Not on file  Other Topics Concern  . Not on file  Social History Narrative  . Not on file   Outpatient Encounter Medications as of 02/27/2018  Medication Sig  . acetaminophen (TYLENOL) 325 MG tablet Take 325 mg by mouth daily as needed for moderate pain or headache.  . citalopram (CELEXA) 40 MG tablet Take  40 mg by mouth daily.  Marland Kitchen levothyroxine (SYNTHROID, LEVOTHROID) 88 MCG tablet TAKE 1 TABLET BY MOUTH EVERY DAY BEFORE BREAKFAST  . [DISCONTINUED] levothyroxine (SYNTHROID, LEVOTHROID) 88 MCG tablet TAKE 1 TABLET BY MOUTH EVERY DAY BEFORE BREAKFAST   No facility-administered encounter medications on file as of 02/27/2018.    ALLERGIES: Allergies  Allergen Reactions  . Amoxicillin Rash  . Penicillins Rash    Has patient had a PCN reaction causing immediate rash, facial/tongue/throat swelling, SOB or lightheadedness with hypotension: Yes Has patient had a PCN reaction causing severe rash involving mucus membranes or skin necrosis: No Has patient had a PCN reaction that required hospitalization No Has patient had a PCN reaction occurring within the last 10 years: Yes If all of the above answers are "NO", then may proceed with Cephalosporin use.    VACCINATION STATUS:  There is no immunization history on file for this patient.  HPI 37 year old female patient with medical history as above. 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 - Her surgical pathology was negative for malignancy. She is currently on levothyroxine 88 g by mouth every morning.  - She denies family  history of thyroid dysfunction. She denies exposure to neck radiation. She denies heat/cold intolerance. She has steady body weight. Denies dysphagia, shortness of breath, nor voice change.  Review of Systems  Constitutional:  + Steady weight, no fatigue, no subjective hyperthermia/hypothermia Eyes: no blurry vision, no xerophthalmia ENT: no sore throat, no nodules palpated in throat, no dysphagia/odynophagia, no hoarseness, + status post thyroidectomy Cardiovascular: No chest pain, no palpitations, no leg swelling.  Respiratory: no cough/SOB Gastrointestinal: no N/V/D/C Musculoskeletal: no muscle/joint aches Skin: no rashes Neurological: no  tremors/numbness/tingling/dizziness Psychiatric: + depression on Celexa, -anxiety  Objective:    BP 103/69   Pulse 68   Ht 5\' 1"  (1.549 m)   Wt 119 lb (54 kg)   BMI 22.48 kg/m   Wt Readings from Last 3 Encounters:  02/27/18 119 lb (54 kg)  11/26/17 121 lb (54.9 kg)  05/15/17 122 lb (55.3 kg)    Physical Exam Constitutional:  in NAD Eyes: PERRLA, EOMI, no exophthalmos ENT: moist mucous membranes, + healed post thyroidectomy scar on anterior lower neck ,  no cervical lymphadenopathy Skin: moist, warm, no rashes Neurological: No tremors of outstretched hands, DTR reflexes are normal on bilateral lower extremities.     Recent Results (from the past 2160 hour(s))  TSH     Status: Abnormal   Collection Time: 01/29/18 10:59 AM  Result Value Ref Range   TSH 0.27 (L) mIU/L    Comment:           Reference Range .           > or = 20 Years  0.40-4.50 .                Pregnancy Ranges           First trimester    0.26-2.66           Second trimester   0.55-2.73           Third trimester    0.43-2.91   T4, free     Status: None   Collection Time: 01/29/18 10:59 AM  Result Value Ref Range   Free T4 1.2 0.8 - 1.8 ng/dL   Recent Results (from the past 2160 hour(s))  TSH     Status: Abnormal   Collection Time: 01/29/18 10:59 AM  Result Value Ref Range   TSH 0.27 (L) mIU/L    Comment:           Reference Range .           > or = 20 Years  0.40-4.50 .                Pregnancy Ranges           First trimester    0.26-2.66           Second trimester   0.55-2.73           Third trimester    0.43-2.91   T4, free     Status: None   Collection Time: 01/29/18 10:59 AM  Result Value Ref Range   Free T4 1.2 0.8 - 1.8 ng/dL      09/13/2016 surgical pathology of total thyroidectomy-no malignancy. Thyroid, thyroidectomy, Right - FOLLICULAR ADENOMA WITH CALCIFICATIONS, 2.1 CM. - MICROSCOPIC INTRATHYROIDAL PARATHYROID TISSUE. - THERE IS NO EVIDENCE OF MALIGNANCY.  July 20  11/29/2015 fine-needle aspiration of 1.5 cm solitary right lobe thyroid nodule showed: Follicular neoplasm or suspicious for a follicular neoplasm (bethesda  category IV).  January 29, 2018 thyroid/neck ultrasound: Thyroid surgically absent with no evidence of remnant or recurrence.  Assessment & Plan:  1. 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    Her surgical sample diagnosis showed no malignancy.  - She did not require radioactive iodine ablative therapy.   -Regarding her postsurgical hypothyroidism: -Her thyroid function tests are consistent with appropriate replacement.  She is advised to continue levothyroxine 88 mcg p.o. Am.   - We discussed about correct intake of levothyroxine, at fasting, with water, separated by at least 30 minutes from breakfast, and separated by more than 4 hours from calcium, iron, multivitamins, acid reflux medications (PPIs). -Patient is made aware of the fact that thyroid hormone replacement is needed for life, dose to be adjusted by periodic monitoring of thyroid function tests.  - I advised patient to maintain close follow up with [facility] for primary care needs. Follow up plan: Return in about 1 year (around 02/28/2019) for follow up with pre-visit labs.  Glade Lloyd, MD Phone: [phone]  Fax: [phone]  -  This note was partially dictated with voice recognition software. Similar sounding words can be transcribed inadequately or may not  be corrected upon review.  02/27/2018, 10:23 AM

## 2019-02-28 ENCOUNTER — Ambulatory Visit: Payer: BC Managed Care – PPO | Admitting: "Endocrinology

## 2019-02-28 ENCOUNTER — Telehealth: Payer: Self-pay

## 2019-02-28 DIAGNOSIS — E890 Postprocedural hypothyroidism: Secondary | ICD-10-CM

## 2019-02-28 NOTE — Telephone Encounter (Signed)
Labs re-ordered

## 2019-03-01 LAB — TSH: TSH: 3.09 m[IU]/L

## 2019-03-01 LAB — T4, FREE: Free T4: 1.2 ng/dL (ref 0.8–1.8)

## 2019-03-10 ENCOUNTER — Encounter: Payer: Self-pay | Admitting: "Endocrinology

## 2019-03-10 ENCOUNTER — Ambulatory Visit (INDEPENDENT_AMBULATORY_CARE_PROVIDER_SITE_OTHER): Payer: BC Managed Care – PPO | Admitting: "Endocrinology

## 2019-03-10 ENCOUNTER — Other Ambulatory Visit: Payer: Self-pay

## 2019-03-10 VITALS — BP 111/73 | HR 56 | Ht 61.0 in | Wt 124.0 lb

## 2019-03-10 DIAGNOSIS — E890 Postprocedural hypothyroidism: Secondary | ICD-10-CM | POA: Diagnosis not present

## 2019-03-10 MED ORDER — LEVOTHYROXINE SODIUM 88 MCG PO TABS: ORAL_TABLET | ORAL | 3 refills | Status: DC

## 2019-03-10 NOTE — Progress Notes (Signed)
Endocrinology follow-up note   Subjective:    Patient ID: Sophia Moore, female    DOB: [birth year], P[facility]   Past Medical History:  Diagnosis Date  . Depression    takes Citalopram daily  . Thyroid nodule 07/2016   Past Surgical History:  Procedure Laterality Date  . THYROIDECTOMY  09/13/2016  . THYROIDECTOMY N/A 09/13/2016   Procedure: TOTAL THYROIDECTOMY;  Surgeon: Leta Baptist, MD;  Location: [facility] OR;  Service: ENT;  Laterality: N/A;  . wisdom teeth extracted     . WISDOM TOOTH EXTRACTION     Social History   Socioeconomic History  . Marital status: Divorced    Spouse name: Not on file  . Number of children: Not on file  . Years of education: Not on file  . Highest education level: Not on file  Occupational History  . Not on file  Social Needs  . Financial resource strain: Not on file  . Food insecurity:    Worry: Not on file    Inability: Not on file  . Transportation needs:    Medical: Not on file    Non-medical: Not on file  Tobacco Use  . Smoking status: Never Smoker  . Smokeless tobacco: Never Used  Substance and Sexual Activity  . Alcohol use: No  . Drug use: No  . Sexual activity: Not Currently    Birth control/protection: I.U.D.  Lifestyle  . Physical activity:    Days per week: Not on file    Minutes per session: Not on file  . Stress: Not on file  Relationships  . Social connections:    Talks on phone: Not on file    Gets together: Not on file    Attends religious service: Not on file    Active member of club or organization: Not on file    Attends meetings of clubs or organizations: Not on file    Relationship status: Not on file  Other Topics Concern  . Not on file  Social History Narrative  . Not on file   Outpatient Encounter Medications as of 03/10/2019  Medication Sig  . levothyroxine (SYNTHROID) 88 MCG tablet TAKE 1 TABLET BY MOUTH EVERY DAY BEFORE BREAKFAST  . [DISCONTINUED]  acetaminophen (TYLENOL) 325 MG tablet Take 325 mg by mouth daily as needed for moderate pain or headache.  . [DISCONTINUED] citalopram (CELEXA) 40 MG tablet Take 40 mg by mouth daily.  . [DISCONTINUED] levothyroxine (SYNTHROID, LEVOTHROID) 88 MCG tablet TAKE 1 TABLET BY MOUTH EVERY DAY BEFORE BREAKFAST   No facility-administered encounter medications on file as of 03/10/2019.    ALLERGIES: Allergies  Allergen Reactions  . Amoxicillin Rash  . Penicillins Rash    Has patient had a PCN reaction causing immediate rash, facial/tongue/throat swelling, SOB or lightheadedness with hypotension: Yes Has patient had a PCN reaction causing severe rash involving mucus membranes or skin necrosis: No Has patient had a PCN reaction that required hospitalization No Has patient had a PCN reaction occurring within the last 10 years: Yes If all of the above answers are "NO", then may proceed with Cephalosporin use.    VACCINATION STATUS:  There is no immunization history on file for this patient.  HPI 38 year old female patient with medical history as above. 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 - Her surgical pathology was negative for malignancy. She is currently on levothyroxine 88 g by mouth every morning.  -  She denies family history of thyroid dysfunction. She denies exposure to neck radiation. She denies heat/cold intolerance. She has steady body weight. Denies dysphagia, shortness of breath, nor voice change.  Review of Systems  Constitutional:  + Steady weight, no fatigue, no subjective hyperthermia/hypothermia Eyes: no blurry vision, no xerophthalmia ENT: no sore throat, no nodules palpated in throat, no dysphagia/odynophagia, no hoarseness, + status post thyroidectomy Cardiovascular: No chest pain, no palpitations, no leg swelling.  Respiratory: no cough/SOB Gastrointestinal: no N/V/D/C Musculoskeletal: no  muscle/joint aches Skin: no rashes Neurological: no tremors/numbness/tingling/dizziness   Objective:    BP 111/73   Pulse (!) 56   Ht 5\' 1"  (1.549 m)   Wt 124 lb (56.2 kg)   BMI 23.43 kg/m   Wt Readings from Last 3 Encounters:  03/10/19 124 lb (56.2 kg)  02/27/18 119 lb (54 kg)  11/26/17 121 lb (54.9 kg)    Physical Exam Constitutional:  not in acute distress, normal state of mind Eyes:  EOMI, no exophthalmos Neck: Supple Respiratory: Adequate breathing efforts Musculoskeletal: no gross deformities, strength intact in all four extremities Skin:  no rashes, no hyperemia Neurological: no tremor with outstretched hands.   Recent Results (from the past 2160 hour(s))  T4, free     Status: None   Collection Time: 02/28/19 10:24 AM  Result Value Ref Range   Free T4 1.2 0.8 - 1.8 ng/dL  TSH     Status: None   Collection Time: 02/28/19 10:24 AM  Result Value Ref Range   TSH 3.09 mIU/L    Comment:           Reference Range .           > or = 20 Years  0.40-4.50 .                Pregnancy Ranges           First trimester    0.26-2.66           Second trimester   0.55-2.73           Third trimester    0.43-2.91    Recent Results (from the past 2160 hour(s))  T4, free     Status: None   Collection Time: 02/28/19 10:24 AM  Result Value Ref Range   Free T4 1.2 0.8 - 1.8 ng/dL  TSH     Status: None   Collection Time: 02/28/19 10:24 AM  Result Value Ref Range   TSH 3.09 mIU/L    Comment:           Reference Range .           > or = 20 Years  0.40-4.50 .                Pregnancy Ranges           First trimester    0.26-2.66           Second trimester   0.55-2.73           Third trimester    0.43-2.91       09/13/2016 surgical pathology of total thyroidectomy-no malignancy. Thyroid, thyroidectomy, Right - FOLLICULAR ADENOMA WITH CALCIFICATIONS, 2.1 CM. - MICROSCOPIC INTRATHYROIDAL PARATHYROID TISSUE. - THERE IS NO EVIDENCE OF MALIGNANCY.  July 20 11/29/2015  fine-needle aspiration of 1.5 cm solitary right lobe thyroid nodule showed: Follicular neoplasm or suspicious for a follicular neoplasm (bethesda  category IV).  January 29, 2018 thyroid/neck ultrasound: Thyroid surgically absent  with no evidence of remnant or recurrence.  Assessment & Plan:  1. 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    Her surgical sample diagnosis showed no malignancy.  - She did not require radioactive iodine ablative therapy.   -Regarding her postsurgical hypothyroidism: -Her thyroid function tests are consistent with appropriate replacement.  She is advised to continue levothyroxine 88 mcg p.o. Am.   - We discussed about the correct intake of her thyroid hormone, on empty stomach at fasting, with water, separated by at least 30 minutes from breakfast and other medications,  and separated by more than 4 hours from calcium, iron, multivitamins, acid reflux medications (PPIs). -Patient is made aware of the fact that thyroid hormone replacement is needed for life, dose to be adjusted by periodic monitoring of thyroid function tests.   - I advised patient to maintain close follow up with [facility] for primary care needs. Follow up plan: Return in about 1 year (around 03/09/2020) for Follow up with Pre-visit Labs.  Glade Lloyd, MD Phone: [phone]  Fax: [phone]  -  This note was partially dictated with voice recognition software. Similar sounding words can be transcribed inadequately or may not  be corrected upon review.  03/10/2019, 3:39 PM

## 2019-07-18 ENCOUNTER — Other Ambulatory Visit: Payer: Self-pay

## 2019-07-18 DIAGNOSIS — Z20822 Contact with and (suspected) exposure to covid-19: Secondary | ICD-10-CM

## 2019-07-20 LAB — NOVEL CORONAVIRUS, NAA: SARS-CoV-2, NAA: NOT DETECTED

## 2019-12-08 DIAGNOSIS — R42 Dizziness and giddiness: Secondary | ICD-10-CM | POA: Insufficient documentation

## 2020-02-17 ENCOUNTER — Other Ambulatory Visit: Payer: Self-pay

## 2020-02-17 ENCOUNTER — Telehealth: Payer: Self-pay | Admitting: "Endocrinology

## 2020-02-17 DIAGNOSIS — E890 Postprocedural hypothyroidism: Secondary | ICD-10-CM

## 2020-02-17 NOTE — Telephone Encounter (Signed)
Lab orders updated and sent to [facility]. 

## 2020-02-17 NOTE — Telephone Encounter (Signed)
Can you update a lab order for this patient for [facility]

## 2020-03-06 LAB — T4, FREE: Free T4: 1.2 ng/dL (ref 0.8–1.8)

## 2020-03-06 LAB — TSH: TSH: 2.54 mIU/L

## 2020-03-16 ENCOUNTER — Encounter: Payer: Self-pay | Admitting: "Endocrinology

## 2020-03-16 ENCOUNTER — Other Ambulatory Visit: Payer: Self-pay

## 2020-03-16 ENCOUNTER — Ambulatory Visit (INDEPENDENT_AMBULATORY_CARE_PROVIDER_SITE_OTHER): Payer: BC Managed Care – PPO | Admitting: "Endocrinology

## 2020-03-16 VITALS — BP 102/66 | HR 71 | Ht 61.0 in | Wt 121.6 lb

## 2020-03-16 DIAGNOSIS — E890 Postprocedural hypothyroidism: Secondary | ICD-10-CM | POA: Diagnosis not present

## 2020-03-16 MED ORDER — LEVOTHYROXINE SODIUM 88 MCG PO TABS: ORAL_TABLET | ORAL | 3 refills | Status: DC

## 2020-03-16 NOTE — Progress Notes (Signed)
03/16/2020  Endocrinology follow-up note   Subjective:    Patient ID: Sophia Moore, female    DOB: [birth year], PCP [facility]   Past Medical History:  Diagnosis Date  . Depression    takes Citalopram daily  . Thyroid nodule 07/2016   Past Surgical History:  Procedure Laterality Date  . THYROIDECTOMY  09/13/2016  . THYROIDECTOMY N/A 09/13/2016   Procedure: TOTAL THYROIDECTOMY;  Surgeon: Leta Baptist, MD;  Location: [facility] OR;  Service: ENT;  Laterality: N/A;  . wisdom teeth extracted     . WISDOM TOOTH EXTRACTION     Social History   Socioeconomic History  . Marital status: Divorced    Spouse name: Not on file  . Number of children: Not on file  . Years of education: Not on file  . Highest education level: Not on file  Occupational History  . Not on file  Tobacco Use  . Smoking status: Never Smoker  . Smokeless tobacco: Never Used  Substance and Sexual Activity  . Alcohol use: No  . Drug use: No  . Sexual activity: Not Currently    Birth control/protection: I.U.D.  Other Topics Concern  . Not on file  Social History Narrative  . Not on file   Social Determinants of Health   Financial Resource Strain:   . Difficulty of Paying Living Expenses:   Food Insecurity:   . Worried About Charity fundraiser in the Last Year:   . Arboriculturist in the Last Year:   Transportation Needs:   . Film/video editor (Medical):   Marland Kitchen Lack of Transportation (Non-Medical):   Physical Activity:   . Days of Exercise per Week:   . Minutes of Exercise per Session:   Stress:   . Feeling of Stress :   Social Connections:   . Frequency of Communication with Friends and Family:   . Frequency of Social Gatherings with Friends and Family:   . Attends Religious Services:   . Active Member of Clubs or Organizations:   . Attends Archivist Meetings:   Marland Kitchen Marital Status:    Outpatient Encounter Medications as of 03/16/2020  Medication Sig   . levothyroxine (SYNTHROID) 88 MCG tablet TAKE 1 TABLET BY MOUTH EVERY DAY BEFORE BREAKFAST  . [DISCONTINUED] levothyroxine (SYNTHROID) 88 MCG tablet TAKE 1 TABLET BY MOUTH EVERY DAY BEFORE BREAKFAST   No facility-administered encounter medications on file as of 03/16/2020.   ALLERGIES: Allergies  Allergen Reactions  . Amoxicillin Rash  . Penicillins Rash    Has patient had a PCN reaction causing immediate rash, facial/tongue/throat swelling, SOB or lightheadedness with hypotension: Yes Has patient had a PCN reaction causing severe rash involving mucus membranes or skin necrosis: No Has patient had a PCN reaction that required hospitalization No Has patient had a PCN reaction occurring within the last 10 years: Yes If all of the above answers are "NO", then may proceed with Cephalosporin use.    VACCINATION STATUS:  There is no immunization history on file for this patient.  HPI 39 year old female patient with medical history as above. 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 - Her surgical pathology was negative for malignancy.  She is currently on thyroid hormone replacement, with levothyroxine 88 mcg p.o. daily before breakfast.  She has no new complaints today.  She denies palpitations, tremors, nor heat intolerance.  - She denies family history of thyroid  dysfunction. She denies exposure to neck radiation. She denies heat/cold intolerance. She has steady body weight. Denies dysphagia, shortness of breath, nor voice change.  Review of Systems  Constitutional:  + Slightly fluctuating heart rate,  no fatigue, no subjective hyperthermia/hypothermia Eyes: no blurry vision, no xerophthalmia ENT: no sore throat, no nodules palpated in throat, no dysphagia/odynophagia, no hoarseness, + status post thyroidectomy Cardiovascular: No chest pain, no palpitations, no leg swelling.  Respiratory: no  cough/SOB Gastrointestinal: no N/V/D/C Musculoskeletal: no muscle/joint aches Skin: no rashes Neurological: no tremors/numbness/tingling/dizziness   Objective:    BP 102/66   Pulse 71   Ht 5\' 1"  (1.549 m)   Wt 121 lb 9.6 oz (55.2 kg)   BMI 22.98 kg/m   Wt Readings from Last 3 Encounters:  03/16/20 121 lb 9.6 oz (55.2 kg)  03/10/19 124 lb (56.2 kg)  02/27/18 119 lb (54 kg)      Physical Exam- Limited  Constitutional:  Body mass index is 22.98 kg/m. , not in acute distress, normal state of mind Eyes:  EOMI, no exophthalmos Neck: Supple Thyroid: + Anterior lower neck scar from previous thyroidectomy Respiratory: Adequate breathing efforts Musculoskeletal: no gross deformities, strength intact in all four extremities, no gross restriction of joint movements Skin:  no rashes, no hyperemia Neurological: no tremor with outstretched hands,    Recent Results (from the past 2160 hour(s))  T4, Free     Status: None   Collection Time: 03/05/20  8:33 AM  Result Value Ref Range   Free T4 1.2 0.8 - 1.8 ng/dL  TSH     Status: None   Collection Time: 03/05/20  8:33 AM  Result Value Ref Range   TSH 2.54 mIU/L    Comment:           Reference Range .           > or = 20 Years  0.40-4.50 .                Pregnancy Ranges           First trimester    0.26-2.66           Second trimester   0.55-2.73           Third trimester    0.43-2.91    Recent Results (from the past 2160 hour(s))  T4, Free     Status: None   Collection Time: 03/05/20  8:33 AM  Result Value Ref Range   Free T4 1.2 0.8 - 1.8 ng/dL  TSH     Status: None   Collection Time: 03/05/20  8:33 AM  Result Value Ref Range   TSH 2.54 mIU/L    Comment:           Reference Range .           > or = 20 Years  0.40-4.50 .                Pregnancy Ranges           First trimester    0.26-2.66           Second trimester   0.55-2.73           Third trimester    0.43-2.91       09/13/2016 surgical pathology of  total thyroidectomy-no malignancy. Thyroid, thyroidectomy, Right - FOLLICULAR ADENOMA WITH CALCIFICATIONS, 2.1 CM. - MICROSCOPIC INTRATHYROIDAL PARATHYROID TISSUE. - THERE IS NO EVIDENCE OF MALIGNANCY.  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  January 29, 2018 thyroid/neck ultrasound: Thyroid surgically absent with no evidence of remnant or recurrence.  Assessment & Plan:  1. 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    Her surgical sample diagnosis showed no malignancy.    - She did not require radioactive iodine ablative therapy.   -Regarding her postsurgical hypothyroidism: -Her thyroid function tests are consistent with appropriate replacement.  She is advised to continue levothyroxine 88 mcg p.o. Am.   - We discussed about the correct intake of her thyroid hormone, on empty stomach at fasting, with water, separated by at least 30 minutes from breakfast and other medications,  and separated by more than 4 hours from calcium, iron, multivitamins, acid reflux medications (PPIs). -Patient is made aware of the fact that thyroid hormone replacement is needed for life, dose to be adjusted by periodic monitoring of thyroid function tests.   - I advised patient to maintain close follow up with [facility] for primary care needs.     - Time spent on this patient care encounter:  20 minutes of which 50% was spent in  counseling and the rest reviewing  her current and  previous labs / studies and medications  doses and developing a plan for long term care. [employer]  participated in the discussions, expressed understanding, and voiced agreement with the above plans.  All questions were answered to her  satisfaction. she is encouraged to contact clinic should she have any questions or concerns prior to her return visit.  Follow up plan: Return in about 1 year (around 03/16/2021) for F/U with Pre-visit Labs.  Glade Lloyd, MD Phone: [phone]  Fax: [phone]  -  This note was partially dictated with voice recognition software. Similar sounding words can be transcribed inadequately or may not  be corrected upon review.  03/16/2020, 2:44 PM

## 2021-03-14 ENCOUNTER — Other Ambulatory Visit: Payer: Self-pay | Admitting: "Endocrinology

## 2021-03-14 ENCOUNTER — Other Ambulatory Visit: Payer: Self-pay

## 2021-03-14 DIAGNOSIS — E890 Postprocedural hypothyroidism: Secondary | ICD-10-CM

## 2021-03-15 LAB — TSH: TSH: 7.23 u[IU]/mL — ABNORMAL HIGH (ref 0.450–4.500)

## 2021-03-15 LAB — T4, FREE: Free T4: 0.94 ng/dL (ref 0.82–1.77)

## 2021-03-16 ENCOUNTER — Ambulatory Visit: Payer: BC Managed Care – PPO | Admitting: "Endocrinology

## 2021-03-16 ENCOUNTER — Encounter: Payer: Self-pay | Admitting: "Endocrinology

## 2021-03-16 ENCOUNTER — Other Ambulatory Visit: Payer: Self-pay

## 2021-03-16 VITALS — BP 100/72 | HR 60 | Ht 61.0 in | Wt 120.8 lb

## 2021-03-16 DIAGNOSIS — E890 Postprocedural hypothyroidism: Secondary | ICD-10-CM

## 2021-03-16 MED ORDER — LEVOTHYROXINE SODIUM 88 MCG PO TABS: ORAL_TABLET | ORAL | 1 refills | Status: DC

## 2021-03-16 NOTE — Progress Notes (Signed)
03/16/2021  Endocrinology follow-up note   Subjective:    Patient ID: Sophia Moore, female    DOB: Feb 20, 1981, P[facility]   Past Medical History:  Diagnosis Date  . Depression    takes Citalopram daily  . Thyroid nodule 07/2016   Past Surgical History:  Procedure Laterality Date  . THYROIDECTOMY  09/13/2016  . THYROIDECTOMY N/A 09/13/2016   Procedure: TOTAL THYROIDECTOMY;  Surgeon: Leta Baptist, MD;  Location: [facility] OR;  Service: ENT;  Laterality: N/A;  . wisdom teeth extracted     . WISDOM TOOTH EXTRACTION     Social History   Socioeconomic History  . Marital status: Divorced    Spouse name: Not on file  . Number of children: Not on file  . Years of education: Not on file  . Highest education level: Not on file  Occupational History  . Not on file  Tobacco Use  . Smoking status: Never Smoker  . Smokeless tobacco: Never Used  Vaping Use  . Vaping Use: Never used  Substance and Sexual Activity  . Alcohol use: No  . Drug use: No  . Sexual activity: Not Currently    Birth control/protection: I.U.D.  Other Topics Concern  . Not on file  Social History Narrative  . Not on file   Social Determinants of Health   Financial Resource Strain: Not on file  Food Insecurity: Not on file  Transportation Needs: Not on file  Physical Activity: Not on file  Stress: Not on file  Social Connections: Not on file   Outpatient Encounter Medications as of 03/16/2021  Medication Sig  . levothyroxine (SYNTHROID) 88 MCG tablet TAKE 1 TABLET BY MOUTH EVERY DAY BEFORE BREAKFAST  . [DISCONTINUED] levothyroxine (SYNTHROID) 88 MCG tablet TAKE 1 TABLET BY MOUTH EVERY DAY BEFORE BREAKFAST   No facility-administered encounter medications on file as of 03/16/2021.   ALLERGIES: Allergies  Allergen Reactions  . Amoxicillin Rash  . Penicillins Rash    Has patient had a PCN reaction causing immediate rash, facial/tongue/throat swelling, SOB or  lightheadedness with hypotension: Yes Has patient had a PCN reaction causing severe rash involving mucus membranes or skin necrosis: No Has patient had a PCN reaction that required hospitalization No Has patient had a PCN reaction occurring within the last 10 years: Yes If all of the above answers are "NO", then may proceed with Cephalosporin use.    VACCINATION STATUS:  There is no immunization history on file for this patient.  HPI 40 year old female patient with medical history as above. 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 - Her surgical pathology was negative for malignancy.  She is currently on thyroid hormone replacement, levothyroxine 88 mcg p.o. daily before breakfast.  She had skipped some doses of thyroid hormone recently.  Her previsit labs are consistent with under replacement.    She has no new complaints today.  She denies palpitations, tremors, nor heat intolerance.  - She denies family history of thyroid dysfunction. She denies exposure to neck radiation. She denies heat/cold intolerance. She has steady body weight. Denies dysphagia, shortness of breath, nor voice change.  Review of Systems  Constitutional:  + Slightly fluctuating heart rate,  no fatigue, no subjective hyperthermia/hypothermia   Objective:    BP 100/72   Pulse 60   Ht 5\' 1"  (1.549 m)   Wt 120 lb 12.8 oz (54.8 kg)   BMI 22.82 kg/m  Wt Readings from Last 3 Encounters:  03/16/21 120 lb 12.8 oz (54.8 kg)  03/16/20 121 lb 9.6 oz (55.2 kg)  03/10/19 124 lb (56.2 kg)      Physical Exam- Limited  Constitutional:  Body mass index is 22.82 kg/m. , not in acute distress, normal state of mind    Recent Results (from the past 2160 hour(s))  T4, Free     Status: None   Collection Time: 03/14/21  8:38 AM  Result Value Ref Range   Free T4 0.94 0.82 - 1.77 ng/dL  TSH     Status: Abnormal   Collection Time:  03/14/21  8:38 AM  Result Value Ref Range   TSH 7.230 (H) 0.450 - 4.500 uIU/mL   Recent Results (from the past 2160 hour(s))  T4, Free     Status: None   Collection Time: 03/14/21  8:38 AM  Result Value Ref Range   Free T4 0.94 0.82 - 1.77 ng/dL  TSH     Status: Abnormal   Collection Time: 03/14/21  8:38 AM  Result Value Ref Range   TSH 7.230 (H) 0.450 - 4.500 uIU/mL      09/13/2016 surgical pathology of total thyroidectomy-no malignancy. Thyroid, thyroidectomy, Right - FOLLICULAR ADENOMA WITH CALCIFICATIONS, 2.1 CM. - MICROSCOPIC INTRATHYROIDAL PARATHYROID TISSUE. - THERE IS NO EVIDENCE OF MALIGNANCY.  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  January 29, 2018 thyroid/neck ultrasound: Thyroid surgically absent with no evidence of remnant or recurrence.  Assessment & Plan:  1. 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    Her surgical sample diagnosis showed no malignancy.    - She did not require radioactive iodine ablative therapy.   -Regarding her postsurgical hypothyroidism: -Her thyroid function tests are consistent with under replacement, however any more than 88 mcg of levothyroxine will lead to over replacement in her case.  She is advised to be consistent and continue    levothyroxine 88 mcg p.o. Am.   - We discussed about the correct intake of her thyroid hormone, on empty stomach at fasting, with water, separated by at least 30 minutes from breakfast and other medications,  and separated by more than 4 hours from calcium, iron, multivitamins, acid reflux medications (PPIs). -Patient is made aware of the fact that thyroid hormone replacement is needed for life, dose to be adjusted by periodic monitoring of thyroid function  tests.   - I advised patient to maintain close follow up with [facility] for primary care needs.    I spent 25 minutes in the care of the patient today including review of labs from Thyroid Function, CMP, and other relevant labs ; imaging/biopsy records (current and previous including abstractions from other facilities); face-to-face time discussing  her lab results and symptoms, medications doses, her options of short and long term treatment based on the latest standards of care / guidelines;   and documenting the encounter.  [employer]  participated in the discussions, expressed understanding, and voiced agreement with the above plans.  All questions were answered to her satisfaction. she is encouraged to contact clinic should she have any questions or concerns prior to her return visit.   Follow up plan: Return in about 6 months (around 09/15/2021) for F/U with Pre-visit Labs.  Glade Lloyd,  MD Phone: [phone]  Fax: [phone]  -  This note was partially dictated with voice recognition software. Similar sounding words can be transcribed inadequately or may not  be corrected upon review.  03/16/2021, 12:40 PM

## 2021-03-29 ENCOUNTER — Other Ambulatory Visit: Payer: Self-pay | Admitting: Gynecology

## 2021-03-29 DIAGNOSIS — R928 Other abnormal and inconclusive findings on diagnostic imaging of breast: Secondary | ICD-10-CM

## 2021-04-08 HISTORY — PX: BREAST BIOPSY: SHX20

## 2021-04-19 ENCOUNTER — Ambulatory Visit
Admission: RE | Admit: 2021-04-19 | Discharge: 2021-04-19 | Disposition: A | Discharge status: Home / Self Care | Payer: BC Managed Care – PPO | Source: Ambulatory Visit | Attending: Gynecology | Admitting: Gynecology

## 2021-04-19 ENCOUNTER — Other Ambulatory Visit: Payer: Self-pay | Admitting: Gynecology

## 2021-04-19 ENCOUNTER — Other Ambulatory Visit: Payer: Self-pay

## 2021-04-19 DIAGNOSIS — R928 Other abnormal and inconclusive findings on diagnostic imaging of breast: Secondary | ICD-10-CM

## 2021-04-26 ENCOUNTER — Other Ambulatory Visit: Payer: Self-pay | Admitting: Gynecology

## 2021-04-26 ENCOUNTER — Other Ambulatory Visit: Payer: Self-pay

## 2021-04-26 ENCOUNTER — Ambulatory Visit
Admission: RE | Admit: 2021-04-26 | Discharge: 2021-04-26 | Disposition: A | Discharge status: Home / Self Care | Payer: BC Managed Care – PPO | Source: Ambulatory Visit | Attending: Gynecology | Admitting: Gynecology

## 2021-04-26 DIAGNOSIS — R928 Other abnormal and inconclusive findings on diagnostic imaging of breast: Secondary | ICD-10-CM

## 2021-04-28 ENCOUNTER — Telehealth: Payer: Self-pay | Admitting: Oncology

## 2021-04-28 NOTE — Telephone Encounter (Signed)
Spoke to patient to confirm morning clinic appointment for 7/27, packet will be mailed to patient

## 2021-05-02 ENCOUNTER — Encounter: Payer: Self-pay | Admitting: *Deleted

## 2021-05-02 DIAGNOSIS — C50411 Malignant neoplasm of upper-outer quadrant of right female breast: Secondary | ICD-10-CM

## 2021-05-02 DIAGNOSIS — Z170 Estrogen receptor positive status [ER+]: Secondary | ICD-10-CM

## 2021-05-03 NOTE — Progress Notes (Signed)
Radiation Oncology         [phone] ________________________________  Initial Outpatient Consultation  Name: Sophia Moore MRN: [id number]  Date: 05/04/2021  DOB: [birth year]  JQ:[id number], [facility]  Rolm Bookbinder, MD   REFERRING PHYSICIAN: Rolm Bookbinder, MD  DIAGNOSIS:    ICD-10-CM   1. Malignant neoplasm of upper-outer quadrant of right breast in female, estrogen receptor positive ([place])  C50.411    Z17.0      Cancer Staging Malignant neoplasm of upper-outer quadrant of right breast in female, estrogen receptor positive ([place]) Staging form: Breast, AJCC 8th Edition - Clinical stage from 05/04/2021: Stage IIA (cT3, cN0, cM0, G2, ER+, PR+, HER2-) - Unsigned Stage prefix: Initial diagnosis Histologic grading system: 3 grade system Laterality: Right Staged by: Pathologist and managing physician Stage used in treatment planning: Yes National guidelines used in treatment planning: Yes Type of national guideline used in treatment planning: NCCN   CHIEF COMPLAINT: Here to discuss management of right breast cancer  HISTORY OF PRESENT ILLNESS::Sophia Moore is a 40 y.o. female who presented with breast abnormality on the following imaging: diagnostic right breast mammogram and ultrasound on the date of 04/19/21 revealing a large mass throughout the majority of the right breast. Additionally seen were satellite nodule extending within the upper-outer quadrant towards the right axilla; concerning for additional sites of disease.  Symptoms, if any, at that time: a firm palpable mass involving the majority of the right breast.   Ultrasound of axilla was negative for suspicious nodes.  At tumor board on imaging the estimate is that her tumor 6.3 cm.  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  ER  status: >95%, positive; PR status 70%, positive, both with strong staining intensity, Her2 status negative; Grade 1/2.  Additionally, biopsy of upper outer axillary tail revealed flat epithelial atypia with calcifications.  She is seen today with her son and a Optometrist.  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  PREVIOUS RADIATION THERAPY: No  PAST MEDICAL HISTORY:  has a past medical history of Depression, Family history of breast cancer, and Thyroid nodule (07/2016).    PAST SURGICAL HISTORY: Past Surgical History:  Procedure Laterality Date   THYROIDECTOMY  09/13/2016   THYROIDECTOMY N/A 09/13/2016   Procedure: TOTAL THYROIDECTOMY;  Surgeon: Leta Baptist, MD;  Location: [facility] OR;  Service: ENT;  Laterality: N/A;   wisdom teeth extracted      WISDOM TOOTH EXTRACTION      FAMILY HISTORY: family history includes Breast cancer in her maternal aunt; Hyperlipidemia in her mother; Stroke in her mother.  SOCIAL HISTORY:  reports that she has never smoked. She has never used smokeless tobacco. She reports that she does not drink alcohol and does not use drugs.  ALLERGIES: Amoxicillin and Penicillins  MEDICATIONS:  Current Outpatient Medications  Medication Sig Dispense Refill   levothyroxine (SYNTHROID) 88 MCG tablet TAKE 1 TABLET BY MOUTH EVERY DAY BEFORE BREAKFAST 90 tablet 1   No current facility-administered medications for this encounter.    REVIEW OF SYSTEMS: As above   PHYSICAL EXAM:  vitals were not taken for this visit.   General: Alert and oriented, in no acute distress Heart: Regular in rate and rhythm with no murmurs, rubs, or gallops. Chest: Clear to auscultation bilaterally, with no rhonchi, wheezes, or rales. Extremities: No cyanosis  or edema. Lymphatics: see Neck Exam Skin: No concerning lesions. Musculoskeletal: symmetric strength and muscle tone throughout. Neurologic:  No obvious focalities. Speech is fluent. Coordination  is intact. Psychiatric: Judgment and insight are intact. Affect is appropriate. Breasts: Right breast notable for 12:00 lesion that is at least 7 cm in greatest dimension.  Both breasts are densely firm. No other palpable masses appreciated in the breasts or axillae bilaterally otherwise.    ECOG =0  0 - Asymptomatic (Fully active, able to carry on all predisease activities without restriction)  1 - Symptomatic but completely ambulatory (Restricted in physically strenuous activity but ambulatory and able to carry out work of a light or sedentary nature. For example, light housework, office work)  2 - Symptomatic, <50% in bed during the day (Ambulatory and capable of all self care but unable to carry out any work activities. Up and about more than 50% of waking hours)  3 - Symptomatic, >50% in bed, but not bedbound (Capable of only limited self-care, confined to bed or chair 50% or more of waking hours)  4 - Bedbound (Completely disabled. Cannot carry on any self-care. Totally confined to bed or chair)  5 - Death   Eustace Pen MM, Creech RH, Tormey DC, et al. [phone]). "Toxicity and response criteria of the [facility]". [place] Oncol. 5 (6): 649-55   LABORATORY DATA:  Lab Results  Component Value Date   WBC 4.5 05/04/2021   HGB 14.9 05/04/2021   HCT 42.2 05/04/2021   MCV 85.1 05/04/2021   PLT 258 05/04/2021   CMP     Component Value Date/Time   NA 141 05/04/2021 0832   K 3.9 05/04/2021 0832   CL 107 05/04/2021 0832   CO2 27 05/04/2021 0832   GLUCOSE 86 05/04/2021 0832   BUN 13 05/04/2021 0832   CREATININE 0.77 05/04/2021 0832   CALCIUM 9.1 05/04/2021 0832   PROT 6.8 05/04/2021 0832   ALBUMIN 3.6 05/04/2021 0832   AST 16 05/04/2021 0832   ALT 7 05/04/2021 0832   ALKPHOS 53 05/04/2021 0832   BILITOT 0.7 05/04/2021 0832   GFRNONAA >60 05/04/2021 2633         RADIOGRAPHY: US BREAST LTD UNI RIGHT INC AXILLA  Result Date: 04/19/2021 CLINICAL DATA:   Patient recalled from screening for right breast mass and distortion. EXAM: DIGITAL DIAGNOSTIC UNILATERAL RIGHT MAMMOGRAM WITH TOMOSYNTHESIS AND CAD; ULTRASOUND RIGHT BREAST LIMITED TECHNIQUE: Right digital diagnostic mammography and breast tomosynthesis was performed. The images were evaluated with computer-aided detection.; Targeted ultrasound examination of the right breast was performed COMPARISON:  Previous exam(s). ACR Breast Density Category c: The breast tissue is heterogeneously dense, which may obscure small masses. FINDINGS: Within the central slightly superior right breast there is a large obscured mass with associated architectural distortion. On physical exam, there is a firm palpable mass involving the majority of the right breast. Targeted ultrasound is performed, showing a large irregular hypoechoic mass involving the central and upper half of the right breast. There is a central component of the mass which measures 4.4 x 2.1 x 6.3 cm. The mass emanates into the upper outer and upper inner quadrants. There are 2-4 satellite nodules identified within the upper-outer right breast. Reference mass measures 0.7 x 0.5 x 0.7 cm. There is an adjacent 0.5 x 0.7 x 0.3 cm mass. No right axillary adenopathy. IMPRESSION: Large mass throughout the majority of the right breast. Satellite nodules extending within the upper-outer quadrant towards the right axilla  concerning for additional sites of disease. RECOMMENDATION: Ultrasound-guided core needle biopsy of the dominant large mass within the central aspect of the right breast. Ultrasound-guided core needle biopsy one of the satellite nodules within the upper-outer right breast. Given the patient's dense breast tissue bilaterally, after diagnosis, recommend bilateral breast MRI for further evaluation. I have discussed the findings and recommendations with the patient. If applicable, a reminder letter will be sent to the patient regarding the next appointment.  BI-RADS CATEGORY  5: Highly suggestive of malignancy. Electronically Signed   By: Lovey Newcomer M.D.   On: 04/19/2021 16:05  MM DIAG BREAST TOMO UNI RIGHT  Result Date: 04/19/2021 CLINICAL DATA:  Patient recalled from screening for right breast mass and distortion. EXAM: DIGITAL DIAGNOSTIC UNILATERAL RIGHT MAMMOGRAM WITH TOMOSYNTHESIS AND CAD; ULTRASOUND RIGHT BREAST LIMITED TECHNIQUE: Right digital diagnostic mammography and breast tomosynthesis was performed. The images were evaluated with computer-aided detection.; Targeted ultrasound examination of the right breast was performed COMPARISON:  Previous exam(s). ACR Breast Density Category c: The breast tissue is heterogeneously dense, which may obscure small masses. FINDINGS: Within the central slightly superior right breast there is a large obscured mass with associated architectural distortion. On physical exam, there is a firm palpable mass involving the majority of the right breast. Targeted ultrasound is performed, showing a large irregular hypoechoic mass involving the central and upper half of the right breast. There is a central component of the mass which measures 4.4 x 2.1 x 6.3 cm. The mass emanates into the upper outer and upper inner quadrants. There are 2-4 satellite nodules identified within the upper-outer right breast. Reference mass measures 0.7 x 0.5 x 0.7 cm. There is an adjacent 0.5 x 0.7 x 0.3 cm mass. No right axillary adenopathy. IMPRESSION: Large mass throughout the majority of the right breast. Satellite nodules extending within the upper-outer quadrant towards the right axilla concerning for additional sites of disease. RECOMMENDATION: Ultrasound-guided core needle biopsy of the dominant large mass within the central aspect of the right breast. Ultrasound-guided core needle biopsy one of the satellite nodules within the upper-outer right breast. Given the patient's dense breast tissue bilaterally, after diagnosis, recommend bilateral  breast MRI for further evaluation. I have discussed the findings and recommendations with the patient. If applicable, a reminder letter will be sent to the patient regarding the next appointment. BI-RADS CATEGORY  5: Highly suggestive of malignancy. Electronically Signed   By: Lovey Newcomer M.D.   On: 04/19/2021 16:05  MM CLIP PLACEMENT RIGHT  Result Date: 04/26/2021 CLINICAL DATA:  Evaluate RIBBON clip and COIL clip placement following 2 separate ultrasound-guided RIGHT breast biopsies. EXAM: 3D DIAGNOSTIC RIGHT MAMMOGRAM POST ULTRASOUND BIOPSY COMPARISON:  Previous exam(s). FINDINGS: 3D Mammographic images were obtained following ultrasound guided biopsy of the 6.3 cm mass at the 12 o'clock position of the RIGHT breast (RIBBON clip) and ultrasound-guided biopsy of a 0.7 cm mass in the UPPER-OUTER RIGHT breast/axillary tail (COIL clip). The RIBBON biopsy marking clip is in expected position at the site of biopsy of the 6.3 cm 12 o'clock position mass. The COIL biopsy marking clip is in expected position at the site of biopsy of the 0.7 cm UPPER-OUTER RIGHT breast/axillary tail mass. The 2 clips are separated by a distance of at least 6.5 cm. IMPRESSION: Appropriate positioning of the RIBBON shaped biopsy marking clip at the site of biopsy in the UPPER RIGHT breast. Appropriate positioning of the COIL biopsy marking clip at the site of biopsy in the UPPER-OUTER RIGHT  breast/axillary tail. Final Assessment: Post Procedure Mammograms for Marker Placement Electronically Signed   By: Margarette Canada M.D.   On: 04/26/2021 08:32  [place] RT BREAST BX W LOC DEV 1ST LESION IMG BX SPEC US GUIDE  Addendum Date: 04/27/2021   ADDENDUM REPORT: 04/27/2021 12:33 ADDENDUM: Pathology revealed GRADE I-II INVASIVE DUCTAL CARCINOMA, DUCTAL CARCINOMA IN SITU WITH NECROSIS AND CALCIFICATIONS of the RIGHT breast, upper, 12:00 o'clock position, (ribbon clip). This was found to be concordant by Dr. Hassan Rowan. Pathology revealed FLAT EPITHELIAL  ATYPIA WITH CALCIFICATIONS of the RIGHT breast, upper outer axillary tail, (coil clip). This was found to be concordant by Dr. Hassan Rowan, with excision recommended. Pathology results were discussed with the patient by telephone. The patient reported doing well after the biopsies with tenderness at the sites. Post biopsy instructions and care were reviewed and questions were answered. The patient was encouraged to call [facility] for any additional concerns. My direct phone number was provided. The patient was referred to [facility] on May 04, 2021. Recommendation for a bilateral breast MRI to exclude any additional sites of disease given the patient's dense breast tissue and age. Pathology results reported by Terie Purser, RN on 04/27/2021. Electronically Signed   By: Margarette Canada M.D.   On: 04/27/2021 12:33   Result Date: 04/27/2021 CLINICAL DATA:  40 year old female large UPPER RIGHT breast mass with multiple UPPER-OUTER RIGHT breast nodules. For tissue sampling of the 6.3 cm dominant mass at the 12 o'clock position and 0.7 cm UPPER OUTER RIGHT breast/axillary tail mass. EXAM: ULTRASOUND GUIDED RIGHT BREAST CORE NEEDLE BIOPSY X 2 COMPARISON:  Previous exam(s). FINDINGS: I met with the patient and we discussed the procedure of ultrasound-guided biopsy, including benefits and alternatives. We discussed the high likelihood of a successful procedure. We discussed the risks of the procedure, including infection, bleeding, tissue injury, clip migration, and inadequate sampling. Informed written consent was given. The usual time-out protocol was performed immediately prior to the procedure. ULTRASOUND-GUIDED RIGHT BREAST CORE NEEDLE BIOPSY #1 (6.3 cm 12 o'clock position RIGHT breast mass-RIBBON clip): Using sterile technique and 1% Lidocaine as local anesthetic, under direct ultrasound visualization, a 12 gauge  spring-loaded device was used to perform biopsy of the 6.3 cm 12 o'clock position mass using a LATERAL approach. At the conclusion of the procedure a RIBBON tissue marker clip was deployed into the biopsy cavity. Follow up 2 view mammogram was performed and dictated separately. ULTRASOUND-GUIDED RIGHT BREAST CORE NEEDLE BIOPSY #2 (0.7 cm UPPER-OUTER RIGHT breast/axillary tail mass-COIL clip): Using sterile technique and 1% Lidocaine as local anesthetic, under direct ultrasound visualization, a 12 gauge spring-loaded device was used to perform biopsy of the 0.7 cm UPPER-OUTER RIGHT breast/axillary tail mass using a LATERAL approach. At the conclusion of the procedure a COIL tissue marker clip was deployed into the biopsy cavity. Follow up 2 view mammogram was performed and dictated separately. IMPRESSION: Ultrasound guided biopsy of 6.3 cm UPPER RIGHT breast mass (RIBBON clip) and ultrasound-guided biopsy of 0.7 cm UPPER-OUTER RIGHT breast/axillary tail mass (COIL clip). No apparent complications. Electronically Signed: By: Margarette Canada M.D. On: 04/26/2021 08:32  [place] RT BREAST BX W LOC DEV EA ADD LESION IMG BX SPEC US GUIDE  Addendum Date: 04/27/2021   ADDENDUM REPORT: 04/27/2021 12:33 ADDENDUM: Pathology revealed GRADE I-II INVASIVE DUCTAL CARCINOMA, DUCTAL CARCINOMA IN SITU WITH NECROSIS AND CALCIFICATIONS of the RIGHT breast, upper, 12:00 o'clock position, (  ribbon clip). This was found to be concordant by Dr. Hassan Rowan. Pathology revealed FLAT EPITHELIAL ATYPIA WITH CALCIFICATIONS of the RIGHT breast, upper outer axillary tail, (coil clip). This was found to be concordant by Dr. Hassan Rowan, with excision recommended. Pathology results were discussed with the patient by telephone. The patient reported doing well after the biopsies with tenderness at the sites. Post biopsy instructions and care were reviewed and questions were answered. The patient was encouraged to call [facility] for any  additional concerns. My direct phone number was provided. The patient was referred to [facility] on May 04, 2021. Recommendation for a bilateral breast MRI to exclude any additional sites of disease given the patient's dense breast tissue and age. Pathology results reported by Terie Purser, RN on 04/27/2021. Electronically Signed   By: Margarette Canada M.D.   On: 04/27/2021 12:33   Result Date: 04/27/2021 CLINICAL DATA:  40 year old female large UPPER RIGHT breast mass with multiple UPPER-OUTER RIGHT breast nodules. For tissue sampling of the 6.3 cm dominant mass at the 12 o'clock position and 0.7 cm UPPER OUTER RIGHT breast/axillary tail mass. EXAM: ULTRASOUND GUIDED RIGHT BREAST CORE NEEDLE BIOPSY X 2 COMPARISON:  Previous exam(s). FINDINGS: I met with the patient and we discussed the procedure of ultrasound-guided biopsy, including benefits and alternatives. We discussed the high likelihood of a successful procedure. We discussed the risks of the procedure, including infection, bleeding, tissue injury, clip migration, and inadequate sampling. Informed written consent was given. The usual time-out protocol was performed immediately prior to the procedure. ULTRASOUND-GUIDED RIGHT BREAST CORE NEEDLE BIOPSY #1 (6.3 cm 12 o'clock position RIGHT breast mass-RIBBON clip): Using sterile technique and 1% Lidocaine as local anesthetic, under direct ultrasound visualization, a 12 gauge spring-loaded device was used to perform biopsy of the 6.3 cm 12 o'clock position mass using a LATERAL approach. At the conclusion of the procedure a RIBBON tissue marker clip was deployed into the biopsy cavity. Follow up 2 view mammogram was performed and dictated separately. ULTRASOUND-GUIDED RIGHT BREAST CORE NEEDLE BIOPSY #2 (0.7 cm UPPER-OUTER RIGHT breast/axillary tail mass-COIL clip): Using sterile technique and 1% Lidocaine as local anesthetic, under direct  ultrasound visualization, a 12 gauge spring-loaded device was used to perform biopsy of the 0.7 cm UPPER-OUTER RIGHT breast/axillary tail mass using a LATERAL approach. At the conclusion of the procedure a COIL tissue marker clip was deployed into the biopsy cavity. Follow up 2 view mammogram was performed and dictated separately. IMPRESSION: Ultrasound guided biopsy of 6.3 cm UPPER RIGHT breast mass (RIBBON clip) and ultrasound-guided biopsy of 0.7 cm UPPER-OUTER RIGHT breast/axillary tail mass (COIL clip). No apparent complications. Electronically Signed: By: Margarette Canada M.D. On: 04/26/2021 08:32     IMPRESSION/PLAN: Locally advanced right breast cancer  She will start antiestrogens and undergo mastectomy.  She will be referred to genetic counseling.  Anticipate MammaPrint testing to determine if she will benefit from chemotherapy.  It was a pleasure meeting the patient today. We discussed the risks, benefits, and side effects of radiotherapy. I recommend radiotherapy to the right chest wall  +/- regional nodes  to reduce her risk of locoregional recurrence by 2/3 and her improve her life expectancy.  We discussed that radiation would take approximately 5-6 weeks to complete and that I would give the patient a few weeks to heal following surgery before starting treatment planning.  If chemotherapy were to be given, this  would precede radiotherapy. We spoke about acute effects including skin irritation and fatigue as well as much less common late effects including internal organ injury or irritation.  We also discussed risks associated with post reconstruction radiation including loss of implant. We spoke about the latest technology that is used to minimize the risk of late effects for patients undergoing radiotherapy to the breast or chest wall. No guarantees of treatment were given. The patient is enthusiastic about proceeding with treatment. I look forward to participating in the patient's care.  I will  await her referral back to me for postoperative follow-up and eventual CT simulation/treatment planning.  On date of service, in total, I spent 45 minutes on this encounter. Patient was seen in person.   __________________________________________   Eppie Gibson, MD  This document serves as a record of services personally performed by Eppie Gibson, MD. It was created on her behalf by Roney Mans, a trained medical scribe. The creation of this record is based on the scribe's personal observations and the provider's statements to them. This document has been checked and approved by the attending provider.

## 2021-05-04 ENCOUNTER — Telehealth: Payer: Self-pay | Admitting: *Deleted

## 2021-05-04 ENCOUNTER — Inpatient Hospital Stay: Payer: BC Managed Care – PPO | Attending: Oncology | Admitting: Oncology

## 2021-05-04 ENCOUNTER — Inpatient Hospital Stay (HOSPITAL_BASED_OUTPATIENT_CLINIC_OR_DEPARTMENT_OTHER): Payer: BC Managed Care – PPO | Admitting: Genetic Counselor

## 2021-05-04 ENCOUNTER — Encounter: Payer: Self-pay | Admitting: *Deleted

## 2021-05-04 ENCOUNTER — Encounter: Payer: Self-pay | Admitting: Radiation Oncology

## 2021-05-04 ENCOUNTER — Inpatient Hospital Stay: Payer: BC Managed Care – PPO | Admitting: Licensed Clinical Social Worker

## 2021-05-04 ENCOUNTER — Other Ambulatory Visit: Payer: Self-pay

## 2021-05-04 ENCOUNTER — Encounter: Payer: Self-pay | Admitting: Physical Therapy

## 2021-05-04 ENCOUNTER — Ambulatory Visit
Admission: RE | Admit: 2021-05-04 | Discharge: 2021-05-04 | Disposition: A | Discharge status: Home / Self Care | Payer: BC Managed Care – PPO | Source: Ambulatory Visit | Attending: Radiation Oncology | Admitting: Radiation Oncology

## 2021-05-04 ENCOUNTER — Encounter: Payer: Self-pay | Admitting: Genetic Counselor

## 2021-05-04 ENCOUNTER — Ambulatory Visit: Payer: BC Managed Care – PPO | Attending: General Surgery | Admitting: Physical Therapy

## 2021-05-04 ENCOUNTER — Inpatient Hospital Stay: Payer: BC Managed Care – PPO

## 2021-05-04 VITALS — BP 126/80 | HR 57 | Temp 97.7°F | Resp 20 | Ht 62.0 in | Wt 118.1 lb

## 2021-05-04 DIAGNOSIS — Z803 Family history of malignant neoplasm of breast: Secondary | ICD-10-CM

## 2021-05-04 DIAGNOSIS — Z880 Allergy status to penicillin: Secondary | ICD-10-CM | POA: Diagnosis not present

## 2021-05-04 DIAGNOSIS — Z9011 Acquired absence of right breast and nipple: Secondary | ICD-10-CM | POA: Diagnosis not present

## 2021-05-04 DIAGNOSIS — Z170 Estrogen receptor positive status [ER+]: Secondary | ICD-10-CM | POA: Diagnosis present

## 2021-05-04 DIAGNOSIS — Z8349 Family history of other endocrine, nutritional and metabolic diseases: Secondary | ICD-10-CM | POA: Diagnosis not present

## 2021-05-04 DIAGNOSIS — C50411 Malignant neoplasm of upper-outer quadrant of right female breast: Secondary | ICD-10-CM

## 2021-05-04 DIAGNOSIS — Z823 Family history of stroke: Secondary | ICD-10-CM | POA: Insufficient documentation

## 2021-05-04 DIAGNOSIS — R293 Abnormal posture: Secondary | ICD-10-CM | POA: Diagnosis present

## 2021-05-04 DIAGNOSIS — Z79899 Other long term (current) drug therapy: Secondary | ICD-10-CM | POA: Diagnosis not present

## 2021-05-04 LAB — GENETIC SCREENING ORDER

## 2021-05-04 LAB — CMP (CANCER CENTER ONLY)
ALT: 7 U/L (ref 0–44)
AST: 16 U/L (ref 15–41)
Albumin: 3.6 g/dL (ref 3.5–5.0)
Alkaline Phosphatase: 53 U/L (ref 38–126)
Anion gap: 7 (ref 5–15)
BUN: 13 mg/dL (ref 6–20)
CO2: 27 mmol/L (ref 22–32)
Calcium: 9.1 mg/dL (ref 8.9–10.3)
Chloride: 107 mmol/L (ref 98–111)
Creatinine: 0.77 mg/dL (ref 0.44–1.00)
GFR, Estimated: >60 mL/min (ref >60)
Glucose, Bld: 86 mg/dL (ref 70–99)
Potassium: 3.9 mmol/L (ref 3.5–5.1)
Sodium: 141 mmol/L (ref 135–145)
Total Bilirubin: 0.7 mg/dL (ref 0.3–1.2)
Total Protein: 6.8 g/dL (ref 6.5–8.1)

## 2021-05-04 LAB — CBC WITH DIFFERENTIAL (CANCER CENTER ONLY)
Abs Immature Granulocytes: 0.01 10*3/uL (ref 0.00–0.07)
Basophils Absolute: 0.1 10*3/uL (ref 0.0–0.1)
Basophils Relative: 1 %
Eosinophils Absolute: 0.1 10*3/uL (ref 0.0–0.5)
Eosinophils Relative: 1 %
HCT: 42.2 % (ref 36.0–46.0)
Hemoglobin: 14.9 g/dL (ref 12.0–15.0)
Immature Granulocytes: 0 %
Lymphocytes Relative: 34 %
Lymphs Abs: 1.5 10*3/uL (ref 0.7–4.0)
MCH: 30 pg (ref 26.0–34.0)
MCHC: 35.3 g/dL (ref 30.0–36.0)
MCV: 85.1 fL (ref 80.0–100.0)
Monocytes Absolute: 0.3 10*3/uL (ref 0.1–1.0)
Monocytes Relative: 8 %
Neutro Abs: 2.5 10*3/uL (ref 1.7–7.7)
Neutrophils Relative %: 56 %
Platelet Count: 258 10*3/uL (ref 150–400)
RBC: 4.96 MIL/uL (ref 3.87–5.11)
RDW: 12.2 % (ref 11.5–15.5)
WBC Count: 4.5 10*3/uL (ref 4.0–10.5)
nRBC: 0 % (ref 0.0–0.2)

## 2021-05-04 MED ORDER — TAMOXIFEN CITRATE 20 MG PO TABS: 20.0000 mg | ORAL_TABLET | Freq: Every day | ORAL | 4 refills | Status: DC

## 2021-05-04 NOTE — Progress Notes (Signed)
[place]  [phone] [phone]) [phone]     ID: Sophia Moore DOB: 08-Apr-1981  MR#: [id number]  EXB#:[id number]  Patient Care Team: [facility] as PCP - General (Family Medicine) Mauro Kaufmann, RN as Oncology Nurse Navigator Rockwell Germany, RN as Oncology Nurse Navigator Rolm Bookbinder, MD as Consulting Physician (General Surgery) Lenni Reckner, Virgie Dad, MD as Consulting Physician (Oncology) Eppie Gibson, MD as Attending Physician (Radiation Oncology) Chauncey Cruel, MD OTHER MD:  CHIEF COMPLAINT: Estrogen receptor positive breast cancer  CURRENT TREATMENT: Tamoxifen, pending definitive surgery   HISTORY OF CURRENT ILLNESS: Sophia Moore had routine screening mammography (her first ever) on 03/22/2021 showing a possible abnormality in the right breast. She underwent right diagnostic mammography with tomography and right breast ultrasonography at [facility] on 04/19/2021 showing: breast density category C; palpable 6.3 cm mass involving the central and upper half of the right breast; 2-4 satellite nodules within upper-outer quadrant; no right axillary adenopathy.  Accordingly on 04/26/2021 she proceeded to biopsy of the right breast area in question. 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 Proliferation marker Ki67 at 5%. HER2 equivocal by immunohistochemistry (2+), but negative by fluorescent in situ hybridization with a signals ratio 1.2 and number per cell 1.8. 2. Right Breast, upper-outer axillary tail  - flat epithelial atypia with calcifications  Cancer Staging Malignant neoplasm of  upper-outer quadrant of right breast in female, estrogen receptor positive ([place]) Staging form: Breast, AJCC 8th Edition - Clinical stage from 05/04/2021: Stage IIA (cT3, cN0, cM0, G2, ER+, PR+, HER2-) - Signed by Chauncey Cruel, MD on 05/04/2021 Stage prefix: Initial diagnosis Histologic grading system: 3 grade system Laterality: Right Staged by: Pathologist and managing physician Stage used in treatment planning: Yes National guidelines used in treatment planning: Yes Type of national guideline used in treatment planning: NCCN  The patient's subsequent history is as detailed below.   INTERVAL HISTORY: Sophia Moore was evaluated in the multidisciplinary breast cancer clinic on 05/04/2021 accompanied by her son Concow. Her case was also presented at the multidisciplinary breast cancer conference on the same day. At that time a preliminary plan was proposed: Right mastectomy with sentinel lymph node sampling, genetics testing, postmastectomy radiation, antiestrogens   REVIEW OF SYSTEMS: There were no specific symptoms leading to the original mammogram, which was routinely scheduled. On the provided questionnaire, [place] reports fatigue that does not affect her activities, runny nose with bleeding, wearing dentures, chills, tinnitus, hoarse voice, and feet swelling. The patient denies unusual headaches, visual changes, nausea, vomiting, stiff neck, dizziness, or gait imbalance. There has been no cough, phlegm production, or pleurisy, no chest pain or pressure, and no change in bowel or bladder habits. The patient denies fever, rash, bleeding, unexplained fatigue or unexplained weight loss. A detailed review of systems was otherwise entirely negative.   COVID 19 VACCINATION STATUS: not vaccinated, infection 05/2019 (results in [place])   PAST MEDICAL HISTORY: Past Medical History:  Diagnosis Date   Depression    takes Citalopram daily   Family history of breast cancer    Thyroid nodule 07/2016     PAST SURGICAL HISTORY: Past Surgical History:  Procedure Laterality Date   THYROIDECTOMY  09/13/2016   THYROIDECTOMY N/A 09/13/2016   Procedure: TOTAL  THYROIDECTOMY;  Surgeon: Leta Baptist, MD;  Location: [facility] OR;  Service: ENT;  Laterality: N/A;   wisdom teeth extracted      WISDOM TOOTH EXTRACTION      FAMILY HISTORY: Family History  Problem Relation Age of Onset   Hyperlipidemia Mother    Stroke Mother    Breast cancer Maternal Aunt        dx 36s   Her parents are both living, her father age 58 and her mother age 76, as of 04/2021. Sophia Moore has two brothers and three sisters. She reports breast cancer in a maternal aunt (age 53+).   GYNECOLOGIC HISTORY:  Patient's last menstrual period was 04/13/2021. Menarche: unsure Age at first live birth: 40 years old [name] 3 LMP 04/13/2021 Contraceptive never used HRT n/a  Hysterectomy? no BSO? 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  She is originally from the [place] section of [place].  Husband Efren work in Architect. She lives at home with daughter Sophia Moore, age 68, and [name], age 87. Her oldest [name], age 52, works at [employer]. She attends a [employer].    ADVANCED DIRECTIVES: In the absence of any documentation to the contrary, the patient's spouse is their HCPOA.    HEALTH MAINTENANCE: Social History   Tobacco Use   Smoking status: Never   Smokeless tobacco: Never  Vaping Use   Vaping Use: Never used  Substance Use Topics   Alcohol use: No   Drug use: No     Colonoscopy: n/a (age)  PAP: 03/2021  Bone density: n/a (age)   Allergies  Allergen Reactions   Amoxicillin Rash   Penicillins Rash    Has patient had a PCN reaction causing immediate rash, facial/tongue/throat swelling, SOB or lightheadedness with hypotension: Yes Has patient had a PCN reaction causing severe rash involving mucus  membranes or skin necrosis: No Has patient had a PCN reaction that required hospitalization No Has patient had a PCN reaction occurring within the last 10 years: Yes If all of the above answers are "NO", then may proceed with Cephalosporin use.     Current Outpatient Medications  Medication Sig Dispense Refill   levothyroxine (SYNTHROID) 88 MCG tablet TAKE 1 TABLET BY MOUTH EVERY DAY BEFORE BREAKFAST 90 tablet 1   No current facility-administered medications for this visit.    OBJECTIVE: Spanish speaker who appears stated age  47:   05/04/21 0849  BP: 126/80  Pulse: (!) 57  Resp: 20  Temp: 97.7 F (36.5 C)  SpO2: 100%     Body mass index is 21.6 kg/m.   Wt Readings from Last 3 Encounters:  05/04/21 118 lb 1.6 oz (53.6 kg)  03/16/21 120 lb 12.8 oz (54.8 kg)  03/16/20 121 lb 9.6 oz (55.2 kg)      ECOG FS:1 - Symptomatic but completely ambulatory  Ocular: Sclerae unicteric, pupils round and equal Ear-nose-throat: Wearing a mask Lymphatic: No cervical or supraclavicular adenopathy Lungs no rales or rhonchi Heart regular rate and rhythm Abd soft, nontender, positive bowel sounds MSK no focal spinal tenderness, no joint edema Neuro: non-focal, well-oriented, appropriate affect Breasts: There is an area of induration in the upper outer portion of the right breast which is not well demarcated.  There is no skin or nipple involvement.  The left breast and both axillae are benign.   LAB RESULTS:  CMP     Component Value Date/Time   NA 141 05/04/2021 0832   K 3.9 05/04/2021 0832   CL 107 05/04/2021 0832   CO2 27 05/04/2021 0832   GLUCOSE 86 05/04/2021 0832   BUN 13 05/04/2021 0832   CREATININE 0.77 05/04/2021 0832   CALCIUM 9.1 05/04/2021 0832   PROT 6.8 05/04/2021 0832   ALBUMIN 3.6 05/04/2021 0832   AST 16 05/04/2021 0832   ALT 7 05/04/2021 0832   ALKPHOS 53 05/04/2021 0832   BILITOT 0.7 05/04/2021 0832   GFRNONAA >60 05/04/2021 0832    No results found  for: TOTALPROTELP, ALBUMINELP, A1GS, A2GS, BETS, BETA2SER, GAMS, MSPIKE, SPEI  Lab Results  Component Value Date   WBC 4.5 05/04/2021   NEUTROABS 2.5 05/04/2021   HGB 14.9 05/04/2021   HCT 42.2 05/04/2021   MCV 85.1 05/04/2021   PLT 258 05/04/2021    No results found for: LABCA2  No components found for: LPFXTK240  No results for input(s): INR in the last 168 hours.  No results found for: LABCA2  No results found for: XBD532  No results found for: DJM426  No results found for: STM196  No results found for: CA2729  No components found for: HGQUANT  No results found for: CEA1 / No results found for: CEA1   No results found for: AFPTUMOR  No results found for: CHROMOGRNA  No results found for: KPAFRELGTCHN, LAMBDASER, KAPLAMBRATIO (kappa/lambda light chains)  No results found for: HGBA, HGBA2QUANT, HGBFQUANT, HGBSQUAN (Hemoglobinopathy evaluation)   No results found for: LDH  No results found for: IRON, TIBC, IRONPCTSAT (Iron and TIBC)  No results found for: FERRITIN  Urinalysis No results found for: COLORURINE, APPEARANCEUR, LABSPEC, PHURINE, GLUCOSEU, HGBUR, BILIRUBINUR, KETONESUR, PROTEINUR, UROBILINOGEN, NITRITE, LEUKOCYTESUR   STUDIES: US BREAST LTD UNI RIGHT INC AXILLA  Result Date: 04/19/2021 CLINICAL DATA:  Patient recalled from screening for right breast mass and distortion. EXAM: DIGITAL DIAGNOSTIC UNILATERAL RIGHT MAMMOGRAM WITH TOMOSYNTHESIS AND CAD; ULTRASOUND RIGHT BREAST LIMITED TECHNIQUE: Right digital diagnostic mammography and breast tomosynthesis was performed. The images were evaluated with computer-aided detection.; Targeted ultrasound examination of the right breast was performed COMPARISON:  Previous exam(s). ACR Breast Density Category c: The breast tissue is heterogeneously dense, which may obscure small masses. FINDINGS: Within the central slightly superior right breast there is a large obscured mass with associated architectural  distortion. On physical exam, there is a firm palpable mass involving the majority of the right breast. Targeted ultrasound is performed, showing a large irregular hypoechoic mass involving the central and upper half of the right breast. There is a central component of the mass which measures 4.4 x 2.1 x 6.3 cm. The mass emanates into the upper outer and upper inner quadrants. There are 2-4 satellite nodules identified within the upper-outer right breast. Reference mass measures 0.7 x 0.5 x 0.7 cm. There is an adjacent 0.5 x 0.7 x 0.3 cm mass. No right axillary adenopathy. IMPRESSION: Large mass throughout the majority of the right breast. Satellite nodules extending within the upper-outer quadrant towards the right axilla concerning for additional sites of disease. RECOMMENDATION: Ultrasound-guided core needle biopsy of the dominant large mass within the central aspect of the right breast. Ultrasound-guided core needle biopsy one of the satellite nodules within the upper-outer right breast. Given the patient's dense breast tissue bilaterally, after diagnosis, recommend bilateral breast MRI for further evaluation. I have discussed the findings and recommendations with the patient. If applicable, a reminder letter will be sent to  the patient regarding the next appointment. BI-RADS CATEGORY  5: Highly suggestive of malignancy. Electronically Signed   By: Lovey Newcomer M.D.   On: 04/19/2021 16:05  MM DIAG BREAST TOMO UNI RIGHT  Result Date: 04/19/2021 CLINICAL DATA:  Patient recalled from screening for right breast mass and distortion. EXAM: DIGITAL DIAGNOSTIC UNILATERAL RIGHT MAMMOGRAM WITH TOMOSYNTHESIS AND CAD; ULTRASOUND RIGHT BREAST LIMITED TECHNIQUE: Right digital diagnostic mammography and breast tomosynthesis was performed. The images were evaluated with computer-aided detection.; Targeted ultrasound examination of the right breast was performed COMPARISON:  Previous exam(s). ACR Breast Density Category c:  The breast tissue is heterogeneously dense, which may obscure small masses. FINDINGS: Within the central slightly superior right breast there is a large obscured mass with associated architectural distortion. On physical exam, there is a firm palpable mass involving the majority of the right breast. Targeted ultrasound is performed, showing a large irregular hypoechoic mass involving the central and upper half of the right breast. There is a central component of the mass which measures 4.4 x 2.1 x 6.3 cm. The mass emanates into the upper outer and upper inner quadrants. There are 2-4 satellite nodules identified within the upper-outer right breast. Reference mass measures 0.7 x 0.5 x 0.7 cm. There is an adjacent 0.5 x 0.7 x 0.3 cm mass. No right axillary adenopathy. IMPRESSION: Large mass throughout the majority of the right breast. Satellite nodules extending within the upper-outer quadrant towards the right axilla concerning for additional sites of disease. RECOMMENDATION: Ultrasound-guided core needle biopsy of the dominant large mass within the central aspect of the right breast. Ultrasound-guided core needle biopsy one of the satellite nodules within the upper-outer right breast. Given the patient's dense breast tissue bilaterally, after diagnosis, recommend bilateral breast MRI for further evaluation. I have discussed the findings and recommendations with the patient. If applicable, a reminder letter will be sent to the patient regarding the next appointment. BI-RADS CATEGORY  5: Highly suggestive of malignancy. Electronically Signed   By: Lovey Newcomer M.D.   On: 04/19/2021 16:05  MM CLIP PLACEMENT RIGHT  Result Date: 04/26/2021 CLINICAL DATA:  Evaluate RIBBON clip and COIL clip placement following 2 separate ultrasound-guided RIGHT breast biopsies. EXAM: 3D DIAGNOSTIC RIGHT MAMMOGRAM POST ULTRASOUND BIOPSY COMPARISON:  Previous exam(s). FINDINGS: 3D Mammographic images were obtained following ultrasound  guided biopsy of the 6.3 cm mass at the 12 o'clock position of the RIGHT breast (RIBBON clip) and ultrasound-guided biopsy of a 0.7 cm mass in the UPPER-OUTER RIGHT breast/axillary tail (COIL clip). The RIBBON biopsy marking clip is in expected position at the site of biopsy of the 6.3 cm 12 o'clock position mass. The COIL biopsy marking clip is in expected position at the site of biopsy of the 0.7 cm UPPER-OUTER RIGHT breast/axillary tail mass. The 2 clips are separated by a distance of at least 6.5 cm. IMPRESSION: Appropriate positioning of the RIBBON shaped biopsy marking clip at the site of biopsy in the UPPER RIGHT breast. Appropriate positioning of the COIL biopsy marking clip at the site of biopsy in the UPPER-OUTER RIGHT breast/axillary tail. Final Assessment: Post Procedure Mammograms for Marker Placement Electronically Signed   By: Margarette Canada M.D.   On: 04/26/2021 08:32  [place] RT BREAST BX W LOC DEV 1ST LESION IMG BX SPEC US GUIDE  Addendum Date: 04/27/2021   ADDENDUM REPORT: 04/27/2021 12:33 ADDENDUM: Pathology revealed GRADE I-II INVASIVE DUCTAL CARCINOMA, DUCTAL CARCINOMA IN SITU WITH NECROSIS AND CALCIFICATIONS of the RIGHT breast, upper, 12:00 o'clock position, (  ribbon clip). This was found to be concordant by Dr. Hassan Rowan. Pathology revealed FLAT EPITHELIAL ATYPIA WITH CALCIFICATIONS of the RIGHT breast, upper outer axillary tail, (coil clip). This was found to be concordant by Dr. Hassan Rowan, with excision recommended. Pathology results were discussed with the patient by telephone. The patient reported doing well after the biopsies with tenderness at the sites. Post biopsy instructions and care were reviewed and questions were answered. The patient was encouraged to call [facility] for any additional concerns. My direct phone number was provided. The patient was referred to [facility] on May 04, 2021. Recommendation for a bilateral breast MRI to exclude any additional sites of disease given the patient's dense breast tissue and age. Pathology results reported by Terie Purser, RN on 04/27/2021. Electronically Signed   By: Margarette Canada M.D.   On: 04/27/2021 12:33   Result Date: 04/27/2021 CLINICAL DATA:  39 year old female large UPPER RIGHT breast mass with multiple UPPER-OUTER RIGHT breast nodules. For tissue sampling of the 6.3 cm dominant mass at the 12 o'clock position and 0.7 cm UPPER OUTER RIGHT breast/axillary tail mass. EXAM: ULTRASOUND GUIDED RIGHT BREAST CORE NEEDLE BIOPSY X 2 COMPARISON:  Previous exam(s). FINDINGS: I met with the patient and we discussed the procedure of ultrasound-guided biopsy, including benefits and alternatives. We discussed the high likelihood of a successful procedure. We discussed the risks of the procedure, including infection, bleeding, tissue injury, clip migration, and inadequate sampling. Informed written consent was given. The usual time-out protocol was performed immediately prior to the procedure. ULTRASOUND-GUIDED RIGHT BREAST CORE NEEDLE BIOPSY #1 (6.3 cm 12 o'clock position RIGHT breast mass-RIBBON clip): Using sterile technique and 1% Lidocaine as local anesthetic, under direct ultrasound visualization, a 12 gauge spring-loaded device was used to perform biopsy of the 6.3 cm 12 o'clock position mass using a LATERAL approach. At the conclusion of the procedure a RIBBON tissue marker clip was deployed into the biopsy cavity. Follow up 2 view mammogram was performed and dictated separately. ULTRASOUND-GUIDED RIGHT BREAST CORE NEEDLE BIOPSY #2 (0.7 cm UPPER-OUTER RIGHT breast/axillary tail mass-COIL clip): Using sterile technique and 1% Lidocaine as local anesthetic, under direct ultrasound visualization, a 12 gauge spring-loaded device was used to perform biopsy of the 0.7 cm UPPER-OUTER RIGHT breast/axillary tail mass using a LATERAL approach. At the conclusion of  the procedure a COIL tissue marker clip was deployed into the biopsy cavity. Follow up 2 view mammogram was performed and dictated separately. IMPRESSION: Ultrasound guided biopsy of 6.3 cm UPPER RIGHT breast mass (RIBBON clip) and ultrasound-guided biopsy of 0.7 cm UPPER-OUTER RIGHT breast/axillary tail mass (COIL clip). No apparent complications. Electronically Signed: By: Margarette Canada M.D. On: 04/26/2021 08:32  [place] RT BREAST BX W LOC DEV EA ADD LESION IMG BX SPEC US GUIDE  Addendum Date: 04/27/2021   ADDENDUM REPORT: 04/27/2021 12:33 ADDENDUM: Pathology revealed GRADE I-II INVASIVE DUCTAL CARCINOMA, DUCTAL CARCINOMA IN SITU WITH NECROSIS AND CALCIFICATIONS of the RIGHT breast, upper, 12:00 o'clock position, (ribbon clip). This was found to be concordant by Dr. Hassan Rowan. Pathology revealed FLAT EPITHELIAL ATYPIA WITH CALCIFICATIONS of the RIGHT breast, upper outer axillary tail, (coil clip). This was found to be concordant by Dr. Hassan Rowan, with excision recommended. Pathology results were discussed with the patient by telephone. The patient reported doing well after the biopsies with tenderness at the sites. Post biopsy instructions and care were reviewed and questions  were answered. The patient was encouraged to call [facility] for any additional concerns. My direct phone number was provided. The patient was referred to [facility] on May 04, 2021. Recommendation for a bilateral breast MRI to exclude any additional sites of disease given the patient's dense breast tissue and age. Pathology results reported by Terie Purser, RN on 04/27/2021. Electronically Signed   By: Margarette Canada M.D.   On: 04/27/2021 12:33   Result Date: 04/27/2021 CLINICAL DATA:  40 year old female large UPPER RIGHT breast mass with multiple UPPER-OUTER RIGHT breast nodules. For tissue sampling of the 6.3 cm dominant mass at the 12 o'clock  position and 0.7 cm UPPER OUTER RIGHT breast/axillary tail mass. EXAM: ULTRASOUND GUIDED RIGHT BREAST CORE NEEDLE BIOPSY X 2 COMPARISON:  Previous exam(s). FINDINGS: I met with the patient and we discussed the procedure of ultrasound-guided biopsy, including benefits and alternatives. We discussed the high likelihood of a successful procedure. We discussed the risks of the procedure, including infection, bleeding, tissue injury, clip migration, and inadequate sampling. Informed written consent was given. The usual time-out protocol was performed immediately prior to the procedure. ULTRASOUND-GUIDED RIGHT BREAST CORE NEEDLE BIOPSY #1 (6.3 cm 12 o'clock position RIGHT breast mass-RIBBON clip): Using sterile technique and 1% Lidocaine as local anesthetic, under direct ultrasound visualization, a 12 gauge spring-loaded device was used to perform biopsy of the 6.3 cm 12 o'clock position mass using a LATERAL approach. At the conclusion of the procedure a RIBBON tissue marker clip was deployed into the biopsy cavity. Follow up 2 view mammogram was performed and dictated separately. ULTRASOUND-GUIDED RIGHT BREAST CORE NEEDLE BIOPSY #2 (0.7 cm UPPER-OUTER RIGHT breast/axillary tail mass-COIL clip): Using sterile technique and 1% Lidocaine as local anesthetic, under direct ultrasound visualization, a 12 gauge spring-loaded device was used to perform biopsy of the 0.7 cm UPPER-OUTER RIGHT breast/axillary tail mass using a LATERAL approach. At the conclusion of the procedure a COIL tissue marker clip was deployed into the biopsy cavity. Follow up 2 view mammogram was performed and dictated separately. IMPRESSION: Ultrasound guided biopsy of 6.3 cm UPPER RIGHT breast mass (RIBBON clip) and ultrasound-guided biopsy of 0.7 cm UPPER-OUTER RIGHT breast/axillary tail mass (COIL clip). No apparent complications. Electronically Signed: By: Margarette Canada M.D. On: 04/26/2021 08:32    ELIGIBLE FOR AVAILABLE RESEARCH PROTOCOL:  no  ASSESSMENT: 40 y.o. Spanish speaker status post right breast upper outer quadrant biopsy 04/26/2021 for a clinical mT3 N0, stage IIA invasive ductal carcinoma, grade 1 or 2, estrogen and progesterone receptor positive, HER2 not amplified, with an MIB-1 of 5%.  (1) genetics testing  (2) definitive surgery pending  (3) adjuvant radiation  (4) tamoxifen started 05/04/2021 in anticipation of surgical delays  PLAN: I met today with [place] to review her new diagnosis. Specifically we discussed the biology of her breast cancer, its diagnosis, staging, treatment  options and prognosis. We first reviewed the fact that cancer is not one disease but more than 100 different diseases and that it is important to keep them separate-- otherwise when friends and relatives discuss their own cancer experiences with [place] confusion can result. Similarly we explained that if breast cancer spreads to the bone or liver, the patient would not have bone cancer or liver cancer, but breast cancer in the bone and breast cancer in the liver: one cancer in three places-- not 3 different cancers which otherwise would have to be treated in  3 different ways.  We discussed the difference between local and systemic therapy. In terms of loco-regional treatment, lumpectomy plus radiation is equivalent to mastectomy as far as survival is concerned.  However in this case there is so much involvement of the right breast that we do not feel it can be salvaged even if she received neoadjuvant treatment and therefore we are recommending mastectomy with sentinel lymph node sampling.  We then discussed the rationale for systemic therapy. There is some risk that this cancer may have already spread to other parts of her body. Patients frequently ask at this point about bone scans, CAT scans and PET scans to find out if they have occult breast cancer somewhere else. The problem is that in early stage disease we are much more likely to find  false positives then true cancers and this would expose the patient to unnecessary procedures as well as unnecessary radiation. Scans cannot answer the question the patient really would like to know, which is whether she has microscopic disease elsewhere in her body. For those reasons we do not recommend them.  Of course we would proceed to aggressive evaluation of any symptoms that might suggest metastatic disease, but that is not the case here.  Next we went over the options for systemic therapy which are anti-estrogens, anti-HER-2 immunotherapy, and chemotherapy. [place]ir[place] does not meet criteria for anti-HER-2 immunotherapy. She is a good candidate for anti-estrogens.  The question of chemotherapy is more complicated. Chemotherapy is most effective in rapidly growing, aggressive tumors. It is much less effective in low-grade, slow growing cancers, like [place] 's. For that reason we are going to request a MammaPrint from the original biopsy as suggested by NCCN guidelines. That will help [place] make a definitive decision regarding chemotherapy in this case.  Juliann Mule also qualifies for genetics testing.  If she carries a deleterious mutation that would significantly increase her risk of developing another breast cancer in the future she would be interested in bilateral mastectomies with reconstruction.  Solana has a good understanding of the overall plan. She agrees with it. She knows the goal of treatment in her case is Moore. She will call with any problems that may develop before her next visit here.  Total encounter time 65 minutes.Sarajane Jews C. Kellyjo Edgren, MD 05/04/2021 5:19 PM Medical Oncology and Hematology [facility] [place], [place] [address] Tel. [phone]    Fax. [phone]   This document serves as a record of services personally performed by Lurline Del, MD. It was created on his behalf by Wilburn Mylar, a trained medical scribe. The creation of  this record is based on the scribe's personal observations and the provider's statements to them.   I, Lurline Del MD, have reviewed the above documentation for accuracy and completeness, and I agree with the above.    *Total Encounter Time as defined by the Centers for Medicare and Medicaid Services includes, in addition to the face-to-face time of a patient visit (documented in the note above) non-face-to-face time: obtaining and reviewing outside history, ordering and reviewing medications, tests or procedures, care coordination (communications with other health care professionals or caregivers) and documentation in the medical record.

## 2021-05-04 NOTE — Therapy (Addendum)
[place], [place], [address] Phone: [phone]   Fax:  [phone]  Physical Therapy Evaluation  Patient Details  Name: Sophia Moore MRN: [id number] Date of Birth: [birth year] Referring Provider (PT): Dr. Wyvonnia Lora   Encounter Date: 05/04/2021   PT End of Session - 05/04/21 1120     Visit Number 1    Number of Visits 2    Date for PT Re-Evaluation 06/29/21    PT Start Time 0945    PT Stop Time 1000   Also saw pt from 1028-1045 for a total of 32 minutes   PT Time Calculation (min) 15 min    Activity Tolerance Patient tolerated treatment well    Behavior During Therapy [facility] for tasks assessed/performed             Past Medical History:  Diagnosis Date   Depression    takes Citalopram daily   Thyroid nodule 07/2016    Past Surgical History:  Procedure Laterality Date   THYROIDECTOMY  09/13/2016   THYROIDECTOMY N/A 09/13/2016   Procedure: TOTAL THYROIDECTOMY;  Surgeon: Leta Baptist, MD;  Location: [facility];  Service: ENT;  Laterality: N/A;   wisdom teeth extracted      WISDOM TOOTH EXTRACTION      There were no vitals filed for this visit.    Subjective Assessment - 05/04/21 1108     Subjective Patient reports she is here today to meet with her medical team for her newly diagnosed right breast cancer.    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 It measures 6.3 cm and is located in the upper outer quadrant. It is ER/PR positive and HER2 negative with a Ki67 of 5%. She had a thyroidectomy in 2017.    Patient Stated Goals Reduce lymphedema risk and learn post op shoulder ROM HEP    Currently in Pain? No/denies                [facility] PT Assessment - 05/04/21 0001       Assessment   Medical Diagnosis Right breast cancer    Referring Provider (PT) Dr.  Wyvonnia Lora    Onset Date/Surgical Date 03/22/21    Hand Dominance Right    Prior Therapy none      Precautions   Precautions Other (comment)    Precaution Comments active cancer; speaks Spanish      Restrictions   Weight Bearing Restrictions No      Balance Screen   Has the patient fallen in the past 6 months No    Has the patient had a decrease in activity level because of a fear of falling?  No    Is the patient reluctant to leave their home because of a fear of falling?  No      Home Environment   Living Environment Private residence    Living Arrangements Spouse/significant other    Available Help at Discharge Family      Prior Function   Level of [place] Full time employment    [employer] bus Geographical information systems officer    Leisure She runs 5x/week for > 30 min (6-10 miles)      Cognition   Overall Cognitive Status Within Functional Limits for tasks assessed      Posture/Postural Control   Posture/Postural Control Postural limitations  Postural Limitations Rounded Shoulders;Forward head      ROM / Strength   AROM / PROM / Strength AROM;Strength      AROM   Overall AROM Comments Cervical AROM is WNL    AROM Assessment Site Shoulder    Right/Left Shoulder Right;Left    Right Shoulder Extension 54 Degrees    Right Shoulder Flexion 148 Degrees    Right Shoulder ABduction 160 Degrees    Right Shoulder Internal Rotation 80 Degrees    Right Shoulder External Rotation 90 Degrees    Left Shoulder Extension 51 Degrees    Left Shoulder Flexion 131 Degrees    Left Shoulder ABduction 156 Degrees    Left Shoulder Internal Rotation 74 Degrees    Left Shoulder External Rotation 77 Degrees      Strength   Overall Strength Within functional limits for tasks performed               LYMPHEDEMA/ONCOLOGY QUESTIONNAIRE - 05/04/21 0001       Type   Cancer Type Right breast cancer      Lymphedema Assessments    Lymphedema Assessments Upper extremities      Right Upper Extremity Lymphedema   10 cm Proximal to Olecranon Process 23.6 cm    Olecranon Process 21.2 cm    10 cm Proximal to Ulnar Styloid Process 20.7 cm    Just Proximal to Ulnar Styloid Process 14.4 cm    Across Hand at [employer] 18.2 cm    At [place] of 2nd Digit 6 cm      Left Upper Extremity Lymphedema   10 cm Proximal to Olecranon Process 22.8 cm    Olecranon Process 20.9 cm    10 cm Proximal to Ulnar Styloid Process 19.5 cm    Just Proximal to Ulnar Styloid Process 13.8 cm    Across Hand at [employer] 17.5 cm    At [place] of 2nd Digit 5.6 cm             L-DEX FLOWSHEETS - 05/04/21 1100       L-DEX LYMPHEDEMA SCREENING   Measurement Type Unilateral    L-DEX MEASUREMENT EXTREMITY Upper Extremity    POSITION  Standing    DOMINANT SIDE Right    At Risk Side Right    BASELINE SCORE (UNILATERAL) 4.5             The patient was assessed using the L-Dex machine today to produce a lymphedema index baseline score. The patient will be reassessed on a regular basis (typically every 3 months) to obtain new L-Dex scores. If the score is > 6.5 points away from his/her baseline score indicating onset of subclinical lymphedema, it will be recommended to wear a compression garment for 4 weeks, 12 hours per day and then be reassessed. If the score continues to be > 6.5 points from baseline at reassessment, we will initiate lymphedema treatment. Assessing in this manner has a 95% rate of preventing clinically significant lymphedema.      Katina Dung - 05/04/21 0001     Open a tight or new jar No difficulty    Do heavy household chores (wash walls, wash floors) No difficulty    Carry a shopping bag or briefcase No difficulty    Wash your back No difficulty    Use a knife to cut food No difficulty    Recreational activities in which you take some force or impact through your arm, shoulder, or hand (golf,  hammering, tennis)  No difficulty    During the past week, to what extent has your arm, shoulder or hand problem interfered with your normal social activities with family, friends, neighbors, or groups? Not at all    During the past week, to what extent has your arm, shoulder or hand problem limited your work or other regular daily activities Not at all    Arm, shoulder, or hand pain. None    Tingling (pins and needles) in your arm, shoulder, or hand None    Difficulty Sleeping No difficulty    DASH Score 0 %              Objective measurements completed on examination: See above findings.      Patient was instructed today in a home exercise program today for post op shoulder range of motion. These included active assist shoulder flexion in sitting, scapular retraction, wall walking with shoulder abduction, and hands behind head external rotation.  She was encouraged to do these twice a day, holding 3 seconds and repeating 5 times when permitted by her physician.           PT Education - 05/04/21 1119     Education Details Lymphedema risk reduction and post op shoulder ROM HEP    Person(s) Educated Patient;Child(ren)    Methods Explanation;Demonstration;Handout    Comprehension Returned demonstration;Verbalized understanding                 PT Long Term Goals - 05/04/21 1126       PT LONG TERM GOAL #1   Title Patient will demonstrate she has regained full shoulder ROM and function post operatively compared to bsaelines.    Time 8    Period Weeks    Status New    Target Date 06/29/21             Breast Clinic Goals - 05/04/21 1126       Patient will be able to verbalize understanding of pertinent lymphedema risk reduction practices relevant to her diagnosis specifically related to skin care.   Time 1    Period Days    Status Achieved      Patient will be able to return demonstrate and/or verbalize understanding of the post-op home exercise program related to regaining  shoulder range of motion.   Time 1    Period Days    Status Achieved      Patient will be able to verbalize understanding of the importance of attending the postoperative After Breast Cancer Class for further lymphedema risk reduction education and therapeutic exercise.   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 It measures 6.3 cm and is located in the upper outer quadrant. It is ER/PR positive and HER2 negative with a Ki67 of 5%. She had a thyroidectomy in 2017. Her multidisciplinary medical team met prior to her assessments to determine a recommended treatment plan. She is planning to have a right mastectomy with a sentinel node biopsy possibly wit reconstruction followed by Mammaprint testing, radiation and anti-estrogen therapy. She will benefit from a post op PT reassessment to determine needs and from L-Dex screens every 3 months for 2 years to detect subclinical lymphedema.  Stability/Clinical Decision Making Stable/Uncomplicated    Clinical Decision Making Low    Rehab Potential Excellent    PT Frequency --   Eval and 1 f/u visit   PT Treatment/Interventions ADLs/Self Care Home Management;Therapeutic exercise;Patient/family education    PT Next Visit Plan Will reassess 3-4 weeks post op    PT Home Exercise Plan Post op shoulder ROM HEP    Consulted and Agree with Plan of Care Patient             Patient will benefit from skilled therapeutic intervention in order to improve the following deficits and impairments:  Postural dysfunction, Decreased range of motion, Decreased knowledge of precautions, Impaired UE functional use, Pain  Visit Diagnosis: Malignant neoplasm of upper-outer quadrant of right breast in female, estrogen receptor positive ([place]) - Plan: PT plan of care cert/re-cert  Abnormal  posture - Plan: PT plan of care cert/re-cert  Patient will follow up at outpatient cancer rehab 3-4 weeks following surgery.  If the patient requires physical therapy at that time, a specific plan will be dictated and sent to the referring physician for approval. The patient was educated today on appropriate basic range of motion exercises to begin post operatively and the importance of attending the After Breast Cancer class following surgery.  Patient was educated today on lymphedema risk reduction practices as it pertains to recommendations that will benefit the patient immediately following surgery.  She verbalized good understanding.      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

## 2021-05-04 NOTE — Progress Notes (Signed)
REFERRING PROVIDER: Chauncey Cruel, MD [address] [place],  [place] [address]  PRIMARY PROVIDER:  [facility]  PRIMARY REASON FOR VISIT:  1. Malignant neoplasm of upper-outer quadrant of right breast in female, estrogen receptor positive ([place])   2. Family history of breast cancer      HISTORY OF PRESENT ILLNESS:   Sophia Moore, a 40 y.o. female, was seen for a [facility] cancer genetics consultation at the request of Dr. Jana Hakim due to a personal and family history of cancer.  Sophia Moore presents to clinic today to discuss the possibility of a hereditary predisposition to cancer, genetic testing, and to further clarify her future cancer risks, as well as potential cancer risks for family members.   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 The tumor is ER+/PR+/Her2-. The treatment plan includes right mastectomy, mammaprint, radiation therapy, and antiestrogen therapy.   RISK FACTORS:  Menarche was at age unknown.  First live birth at age 22.  OCP use for approximately 0 years.  Ovaries intact: yes.  Hysterectomy: no.  Menopausal status: premenopausal.  HRT use: 0 years. Colonoscopy: unsure. Mammogram within the last year: yes.  Past Medical History:  Diagnosis Date   Depression    takes Citalopram daily   Family history of breast cancer    Thyroid nodule 07/2016    Past Surgical History:  Procedure Laterality Date   THYROIDECTOMY  09/13/2016   THYROIDECTOMY N/A 09/13/2016   Procedure: TOTAL THYROIDECTOMY;  Surgeon: Leta Baptist, MD;  Location: [facility];  Service: ENT;  Laterality: N/A;   wisdom teeth extracted      WISDOM TOOTH EXTRACTION      Social History   Socioeconomic History   Marital status: Divorced    Spouse name: Not on file   Number of children: Not on file   Years of education:  Not on file   Highest education level: Not on file  Occupational History   Not on file  Tobacco Use   Smoking status: Never   Smokeless tobacco: Never  Vaping Use   Vaping Use: Never used  Substance and Sexual Activity   Alcohol use: No   Drug use: No   Sexual activity: Not Currently    Birth control/protection: I.U.D.  Other Topics Concern   Not on file  Social History Narrative   Not on file   Social Determinants of Health   Financial Resource Strain: Low Risk    Difficulty of Paying Living Expenses: Not very hard  Food Insecurity: Not on file  Transportation Needs: No Transportation Needs   Lack of Transportation (Medical): No   Lack of Transportation (Non-Medical): No  Physical Activity: Not on file  Stress: Not on file  Social Connections: Not on file     FAMILY HISTORY:  We obtained a detailed, 4-generation family history.  Significant diagnoses are listed below: Family History  Problem Relation Age of Onset   Hyperlipidemia Mother    Stroke Mother    Breast cancer Maternal Aunt        dx 6s   Sophia Moore has one daughter (age 48) and two sons (ages 42 and 23). She has two brothers and three sisters (ages 60-43). None of these relatives have had cancer.  Ms. Vista Lawman mother is alive at age 49 without cancer. There are two maternal aunts and one maternal uncle. One  aunt had breast cancer in her 89s. There is no known cancer among maternal cousins. Ms. Vista Lawman maternal grandmother died in her 7s without cancer. Her maternal grandfather died older than 41 without cancer.   Ms. Vista Lawman father is alive at age 68 without cancer. There are two paternal aunts and one paternal uncle. There is no known cancer among paternal aunts/uncles or paternal cousins. Ms. Vista Lawman paternal grandmother died older than 57 without cancer. Her paternal grandfather is alive at age 30 without cancer.   Ms.  Matthias Moore is unaware of previous family history of genetic testing for hereditary cancer risks. Patient's maternal ancestors are of [place] descent, and paternal ancestors are of [place] descent. There is no reported Ashkenazi Jewish ancestry. There is no known consanguinity.  GENETIC COUNSELING ASSESSMENT: Sophia Moore is a 40 y.o. female with a personal and family history of breast cancer, which is somewhat suggestive of a hereditary cancer syndrome and predisposition to cancer. We, therefore, discussed and recommended the following at today's visit.   DISCUSSION: We discussed that approximately 5-10% of breast cancer is hereditary, with most cases associated with the BRCA1 and BRCA2 genes. There are other genes that can be associated with hereditary breast cancer syndromes. These include ATM, CHEK2, PALB2, etc. We discussed that testing is beneficial for several reasons, including knowing about other cancer risks, identifying potential screening and risk-reduction options that may be appropriate, and to understand if other family members could be at risk for cancer and allow them to undergo genetic testing.   We reviewed the characteristics, features and inheritance patterns of hereditary cancer syndromes. We also discussed genetic testing, including the appropriate family members to test, the process of testing, insurance coverage and turn-around-time for results. We discussed the implications of a negative, positive and/or variant of uncertain significant result. In order to get genetic test results in a timely manner so that [name] can use these genetic test results for surgical decisions, we recommended [name] pursue genetic testing for the [employer]. Once complete, we recommend [name] pursue reflex genetic testing to the CancerNext-Expanded + RNAinsight gene panel.   The BRCAplus panel offered by [employer] and  includes sequencing and deletion/duplication analysis for the following 8 genes: ATM, BRCA1, BRCA2, CDH1, CHEK2, PALB2, PTEN, and TP53. The CancerNext-Expanded + RNAinsight gene panel offered by [employer] and includes sequencing and rearrangement analysis for the following 77 genes: AIP, ALK, APC, ATM, AXIN2, BAP1, BARD1, BLM, BMPR1A, BRCA1, BRCA2, BRIP1, CDC73, CDH1, CDK4, CDKN1B, CDKN2A, CHEK2, CTNNA1, DICER1, FANCC, FH, FLCN, GALNT12, KIF1B, LZTR1, MAX, MEN1, MET, MLH1, MSH2, MSH3, MSH6, MUTYH, NBN, NF1, NF2, NTHL1, PALB2, PHOX2B, PMS2, POT1, PRKAR1A, PTCH1, PTEN, RAD51C, RAD51D, RB1, RECQL, RET, SDHA, SDHAF2, SDHB, SDHC, SDHD, SMAD4, SMARCA4, SMARCB1, SMARCE1, STK11, SUFU, TMEM127, TP53, TSC1, TSC2, VHL and XRCC2 (sequencing and deletion/duplication); EGFR, EGLN1, HOXB13, KIT, MITF, PDGFRA, POLD1 and POLE (sequencing only); EPCAM and GREM1 (deletion/duplication only). RNA data is routinely analyzed for use in variant interpretation for all genes.  Based on [name]'s personal and family history of cancer, she meets medical criteria for genetic testing. Despite that she meets criteria, she may still have an out of pocket cost.   PLAN: After considering the risks, benefits, and limitations, Sophia Moore provided informed consent to pursue genetic testing and the blood sample was sent to [facility] for analysis of the BRCAplus and CancerNext-Expanded + RNAinsight panel. Results should be available within approximately one-two weeks' time, at which point they will be disclosed  by telephone to [name], as will any additional recommendations warranted by these results. Sophia Moore will receive a summary of her genetic counseling visit and a copy of her results once available. This information will also be available in Epic.   Ms. Vista Lawman questions were answered to her satisfaction today. Our contact information was provided should  additional questions or concerns arise. Thank you for the referral and allowing [place] to share in the care of your patient.   Clint Guy, [place], [facility] Licensed, Certified Dispensing [email or web address] Phone: [phone]  The patient was seen for a total of 20 minutes in face-to-face genetic counseling. Patient was seen with her son, [name]. This patient was discussed with Drs. Magrinat, Lindi Adie and/or Burr Medico who agrees with the above.    _______________________________________________________________________ For Office Staff:  Number of people involved in session: 1 Was an Intern/ student involved with case: no

## 2021-05-04 NOTE — Patient Instructions (Signed)

## 2021-05-04 NOTE — Telephone Encounter (Signed)
Received order for Mammaprint testing per DR. Magrinat on [facility]. Requisition faxed to [facility] and [place]

## 2021-05-04 NOTE — Progress Notes (Signed)
[place] Work  Initial Assessment   Sophia Moore is a 40 y.o. year old female accompanied by [name]). Clinical Social Work was referred by [facility] for assessment of psychosocial needs.   SDOH (Social Determinants of Health) assessments performed: Yes SDOH Interventions    Flowsheet Row Most Recent Value  SDOH Interventions   Financial Strain Interventions Intervention Not Indicated  Housing Interventions Intervention Not Indicated  Transportation Interventions Intervention Not Indicated       Distress Screen completed: Yes ONCBCN DISTRESS SCREENING 05/04/2021  Screening Type Initial Screening  Distress experienced in past week (1-10) 1  Emotional problem type Adjusting to illness  Physical Problem type Swollen arms/legs      Family/Social Information:  Housing Arrangement: patient lives with husband Electrical engineer), children Sophia Moore- 23, Sophia Moore- 12, Sophia Moore- 10) Family members/support persons in your life? 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 Income source: Employment & husband's employment Engineer, materials) Financial concerns: No Type of concern: None Food access concerns: no Religious or spiritual practice: yes, Catholic Medication Concerns: no  Services Currently in place:  n/a  Coping/ Adjustment to diagnosis: Patient understands treatment plan and what happens next? yes Concerns about diagnosis and/or treatment:  General adjustment to diagnosis Patient reported stressors: Adjusting to my illness. Sons having a harder time than her husband & daughter Hopes and priorities: be treated and then continue her life Patient enjoys time with family/ friends and running (runs between 5-10 miles) Current coping skills/ strengths: Capable of independent living, Motivation for treatment/growth, Special hobby/interest, and Supportive family/friends     SUMMARY: Current SDOH Barriers:  No significant SDOH barriers noted today  Clinical Social Work Clinical Goal(s):  No clinical SW goals at this time  Interventions: Discussed common feeling and emotions when being diagnosed with cancer, and the importance of support during treatment Informed patient of the support team roles and support services at [facility] Provided CSW contact information and encouraged patient to call with any questions or concerns   Follow Up Plan: Patient will contact CSW with any support or resource needs Patient verbalizes understanding of plan: Yes    Sophia Douglas LCSW

## 2021-05-05 ENCOUNTER — Telehealth: Payer: Self-pay | Admitting: Oncology

## 2021-05-05 NOTE — Telephone Encounter (Signed)
Scheduled appointment per 07/27 los. Patient is aware.

## 2021-05-09 ENCOUNTER — Other Ambulatory Visit: Payer: Self-pay | Admitting: General Surgery

## 2021-05-09 DIAGNOSIS — Z170 Estrogen receptor positive status [ER+]: Secondary | ICD-10-CM

## 2021-05-09 DIAGNOSIS — C50411 Malignant neoplasm of upper-outer quadrant of right female breast: Secondary | ICD-10-CM

## 2021-05-09 HISTORY — PX: MASTECTOMY: SHX3

## 2021-05-11 DIAGNOSIS — Z1379 Encounter for other screening for genetic and chromosomal anomalies: Secondary | ICD-10-CM | POA: Insufficient documentation

## 2021-05-12 ENCOUNTER — Encounter: Payer: Self-pay | Admitting: *Deleted

## 2021-05-12 ENCOUNTER — Other Ambulatory Visit: Payer: Self-pay | Admitting: Oncology

## 2021-05-12 ENCOUNTER — Telehealth: Payer: Self-pay | Admitting: *Deleted

## 2021-05-12 NOTE — Telephone Encounter (Signed)
Spoke with patient to follow up from [facility] and assess navigation needs.  Had some questions regarding tamoxifen.  She states it has caused her some stomach upset since starting it.  I instructed her to go ahead and stop the medication anyway due to upcoming sx 8/24. Patient verbalized understanding.  Received mammaprint results of low risk. Patient is aware.  Encouraged her to call should anything else arise. Patient verbalized understanding.

## 2021-05-13 ENCOUNTER — Encounter: Payer: Self-pay | Admitting: Genetic Counselor

## 2021-05-13 ENCOUNTER — Telehealth: Payer: Self-pay | Admitting: Genetic Counselor

## 2021-05-13 NOTE — Telephone Encounter (Signed)
Revealed negative genetic testing through the [facility] panel. Discussed that we do not know why she has breast cancer or why there is cancer in the family. It is possible that there could be a mutation in a different gene that we are not testing, or our current technology may not be able to detect certain mutations. It will therefore be important for her to stay in contact with genetics to keep up with whether additional testing may be appropriate in the future.   Additional testing through the [facility] CancerNext-Expanded + RNAinsight panel is pending. We will call her once these results are available.

## 2021-05-16 ENCOUNTER — Ambulatory Visit: Payer: Self-pay | Admitting: Genetic Counselor

## 2021-05-16 ENCOUNTER — Encounter: Payer: Self-pay | Admitting: Genetic Counselor

## 2021-05-16 ENCOUNTER — Telehealth: Payer: Self-pay | Admitting: Genetic Counselor

## 2021-05-16 DIAGNOSIS — Z1379 Encounter for other screening for genetic and chromosomal anomalies: Secondary | ICD-10-CM

## 2021-05-16 NOTE — Telephone Encounter (Signed)
Revealed negative genetic testing through the [facility] CancerNext-Expanded + RNAinsight panel. A variant of uncertain significance (VUS) was detected in the [facility] gene called p.R1369H (c.4106G>A). Her result is still considered normal at this time and should not impact medical management.

## 2021-05-16 NOTE — Telephone Encounter (Signed)
LVM that the results for her additional genetic testing are available and requested that she call back to discuss them.

## 2021-05-16 NOTE — Progress Notes (Signed)
HPI:  [name] was previously seen in the [place] clinic due to a personal and family history of cancer and concerns regarding a hereditary predisposition to cancer. Please refer to our prior cancer genetics clinic note for more information regarding our discussion, assessment and recommendations, at the time. [name] recent genetic test results were disclosed to her, as were recommendations warranted by these results. These results and recommendations are discussed in more detail below.  CANCER HISTORY:  Oncology History  Malignant neoplasm of upper-outer quadrant of right breast in female, estrogen receptor positive ([place])  05/02/2021 Initial Diagnosis   Malignant neoplasm of upper-outer quadrant of right breast in female, estrogen receptor positive ([place])    05/04/2021 Cancer Staging   Staging form: Breast, AJCC 8th Edition - Clinical stage from 05/04/2021: Stage IIA (cT3, cN0, cM0, G2, ER+, PR+, HER2-) - Signed by Chauncey Cruel, MD on 05/04/2021  Stage prefix: Initial diagnosis  Histologic grading system: 3 grade system  Laterality: Right  Staged by: Pathologist and managing physician  Stage used in treatment planning: Yes  National guidelines used in treatment planning: Yes  Type of national guideline used in treatment planning: NCCN    05/11/2021 Genetic Testing   Negative genetic testing:  No pathogenic variants detected on the [facility] BRCAplus panel (report date 05/11/2021) or CancerNext-Expanded + RNAinsight panel (report date 05/16/2021). A variant of uncertain significance (VUS) was detected in the [facility] gene called p.R1369H (c.4106G>A).  The BRCAplus panel offered by [facility] [employer] and includes sequencing and deletion/duplication analysis for the following 8 genes: ATM, BRCA1, BRCA2, CDH1, CHEK2, PALB2, PTEN, and TP53. The CancerNext-Expanded + RNAinsight gene panel offered by [facility] [employer] and includes sequencing and  rearrangement analysis for the following 77 genes: AIP, ALK, APC, ATM, AXIN2, BAP1, BARD1, BLM, BMPR1A, BRCA1, BRCA2, BRIP1, CDC73, CDH1, CDK4, CDKN1B, CDKN2A, CHEK2, CTNNA1, DICER1, FANCC, FH, FLCN, GALNT12, KIF1B, LZTR1, MAX, MEN1, MET, MLH1, MSH2, MSH3, MSH6, MUTYH, NBN, NF1, NF2, NTHL1, PALB2, PHOX2B, PMS2, POT1, PRKAR1A, PTCH1, PTEN, RAD51C, RAD51D, RB1, RECQL, RET, SDHA, SDHAF2, SDHB, SDHC, SDHD, SMAD4, SMARCA4, SMARCB1, SMARCE1, STK11, SUFU, TMEM127, TP53, TSC1, TSC2, VHL and XRCC2 (sequencing and deletion/duplication); EGFR, EGLN1, HOXB13, KIT, MITF, PDGFRA, POLD1 and POLE (sequencing only); EPCAM and GREM1 (deletion/duplication only). RNA data is routinely analyzed for use in variant interpretation for all genes.     FAMILY HISTORY:  We obtained a detailed, 4-generation family history.  Significant diagnoses are listed below: Family History  Problem Relation Age of Onset   Hyperlipidemia Mother    Stroke Mother    Breast cancer Maternal Aunt        dx 27s   [name] has one daughter (age 86) and two sons (ages 45 and 40). She has two brothers and three sisters (ages 47-43). None of these relatives have had cancer.   [name] mother is alive at age 65 without cancer. There are two maternal aunts and one maternal uncle. One aunt had breast cancer in her 4s. There is no known cancer among maternal cousins. [name] maternal grandmother died in her 62s without cancer. Her maternal grandfather died older than 76 without cancer.   [name] father is alive at age 6 without cancer. There are two paternal aunts and one paternal uncle. There is no known cancer among paternal aunts/uncles or paternal cousins. [name] paternal grandmother died older than 30 without cancer. Her paternal grandfather is alive at age 101 without cancer.   [name] is unaware  of previous family history of genetic testing  for hereditary cancer risks. Patient's maternal ancestors are of [place] descent, and paternal ancestors are of [place] descent. There is no reported Ashkenazi Jewish ancestry. There is no known consanguinity.  GENETIC TEST RESULTS: Genetic testing reported out on 05/11/2021 through the [facility] panel and 05/16/2021 through the [facility] + RNAinsight panel. No pathogenic variants were detected.   The BRCAplus panel offered by [employer] and includes sequencing and deletion/duplication analysis for the following 8 genes: ATM, BRCA1, BRCA2, CDH1, CHEK2, PALB2, PTEN, and TP53. The CancerNext-Expanded + RNAinsight gene panel offered by [employer] and includes sequencing and rearrangement analysis for the following 77 genes: AIP, ALK, APC, ATM, AXIN2, BAP1, BARD1, BLM, BMPR1A, BRCA1, BRCA2, BRIP1, CDC73, CDH1, CDK4, CDKN1B, CDKN2A, CHEK2, CTNNA1, DICER1, FANCC, FH, FLCN, GALNT12, KIF1B, LZTR1, MAX, MEN1, MET, MLH1, MSH2, MSH3, MSH6, MUTYH, NBN, NF1, NF2, NTHL1, PALB2, PHOX2B, PMS2, POT1, PRKAR1A, PTCH1, PTEN, RAD51C, RAD51D, RB1, RECQL, RET, SDHA, SDHAF2, SDHB, SDHC, SDHD, SMAD4, SMARCA4, SMARCB1, SMARCE1, STK11, SUFU, TMEM127, TP53, TSC1, TSC2, VHL and XRCC2 (sequencing and deletion/duplication); EGFR, EGLN1, HOXB13, KIT, MITF, PDGFRA, POLD1 and POLE (sequencing only); EPCAM and GREM1 (deletion/duplication only). RNA data is routinely analyzed for use in variant interpretation for all genes. The test report will be scanned into EPIC and located under the Molecular Pathology section of the Results Review tab.  A portion of the result report is included below for reference.     We discussed with [name] that because current genetic testing is not perfect, it is possible there may be a gene mutation in one of these genes that current testing cannot detect, but that chance is small.  We also discussed that there could be another gene that has not yet been discovered, or that  we have not yet tested, that is responsible for the cancer diagnoses in the family. It is also possible there is a hereditary cause for the cancer in the family that [name] did not inherit and therefore was not identified in her testing.  Therefore, it is important to remain in touch with cancer genetics in the future so that we can continue to offer [name] the most up to date genetic testing.   Genetic testing did identify a variant of uncertain significance (VUS) in the [facility] gene called p.R1369H (c.4106G>A).  At this time, it is unknown if this variant is associated with increased cancer risk or if this is a normal finding, but most variants such as this get reclassified to being inconsequential. It should not be used to make medical management decisions. With time, we suspect the lab will determine the significance of this variant, if any. If we do learn more about it, we will try to contact [name] to discuss it further. However, it is important to stay in touch with [place] periodically and keep the address and phone number up to date.  ADDITIONAL GENETIC TESTING: We discussed with [name] that her genetic testing was fairly extensive.  If there are genes identified to increase cancer risk that can be analyzed in the future, we would be happy to discuss and coordinate this testing at that time.    CANCER SCREENING RECOMMENDATIONS: [name] test result is considered negative (normal).  This means that we have not identified a hereditary cause for her personal and family history of cancer at this time. While reassuring, this does not definitively rule out a hereditary predisposition to cancer. It is still possible  that there could be genetic mutations that are undetectable by current technology. There could be genetic mutations in genes that have not been tested or identified to increase cancer risk.  Therefore, it is  recommended she continue to follow the cancer management and screening guidelines provided by her oncology and primary healthcare provider.   An individual's cancer risk and medical management are not determined by genetic test results alone. Overall cancer risk assessment incorporates additional factors, including personal medical history, family history, and any available genetic information that may result in a personalized plan for cancer prevention and surveillance.  RECOMMENDATIONS FOR FAMILY MEMBERS:  Individuals in this family might be at some increased risk of developing cancer, over the general population risk, simply due to the family history of cancer.  We recommended women in this family have a yearly mammogram beginning at age 62, or 22 years younger than the earliest onset of cancer, an annual clinical breast exam, and perform monthly breast self-exams. Women in this family should also have a gynecological exam as recommended by their primary provider. All family members should be referred for colonoscopy starting at age 29.  FOLLOW-UP: Lastly, we discussed with [name] that cancer genetics is a rapidly advancing field and it is possible that new genetic tests will be appropriate for her and/or her family members in the future. We encouraged her to remain in contact with cancer genetics on an annual basis so we can update her personal and family histories and let her know of advances in cancer genetics that may benefit this family.   Our contact number was provided. [name] questions were answered to her satisfaction, and she knows she is welcome to call us at anytime with additional questions or concerns.   Clint Guy, MS, [facility] Genetic Counselor [email or web address] Phone: [phone]

## 2021-05-18 ENCOUNTER — Encounter: Payer: Self-pay | Admitting: Oncology

## 2021-05-24 ENCOUNTER — Encounter (HOSPITAL_BASED_OUTPATIENT_CLINIC_OR_DEPARTMENT_OTHER): Payer: Self-pay | Admitting: General Surgery

## 2021-05-24 ENCOUNTER — Other Ambulatory Visit: Payer: Self-pay

## 2021-05-27 MED ORDER — ENSURE PRE-SURGERY PO LIQD: 296.0000 mL | Freq: Once | ORAL | Status: DC

## 2021-05-27 NOTE — Progress Notes (Signed)

## 2021-06-01 ENCOUNTER — Ambulatory Visit (HOSPITAL_COMMUNITY)
Admission: RE | Admit: 2021-06-01 | Discharge: 2021-06-01 | Disposition: A | Discharge status: Home / Self Care | Payer: BC Managed Care – PPO | Source: Ambulatory Visit | Attending: General Surgery | Admitting: General Surgery

## 2021-06-01 ENCOUNTER — Other Ambulatory Visit: Payer: Self-pay

## 2021-06-01 ENCOUNTER — Ambulatory Visit (HOSPITAL_BASED_OUTPATIENT_CLINIC_OR_DEPARTMENT_OTHER): Payer: BC Managed Care – PPO | Admitting: Anesthesiology

## 2021-06-01 ENCOUNTER — Encounter (HOSPITAL_BASED_OUTPATIENT_CLINIC_OR_DEPARTMENT_OTHER): Payer: Self-pay | Admitting: General Surgery

## 2021-06-01 ENCOUNTER — Observation Stay (HOSPITAL_BASED_OUTPATIENT_CLINIC_OR_DEPARTMENT_OTHER)
Admission: RE | Admit: 2021-06-01 | Discharge: 2021-06-02 | Disposition: A | Discharge status: Home / Self Care | Payer: BC Managed Care – PPO | Attending: General Surgery | Admitting: General Surgery

## 2021-06-01 ENCOUNTER — Encounter (HOSPITAL_BASED_OUTPATIENT_CLINIC_OR_DEPARTMENT_OTHER)
Admission: RE | Disposition: A | Discharge status: Home / Self Care | Payer: Self-pay | Source: Home / Self Care | Attending: General Surgery

## 2021-06-01 DIAGNOSIS — C50411 Malignant neoplasm of upper-outer quadrant of right female breast: Principal | ICD-10-CM | POA: Insufficient documentation

## 2021-06-01 DIAGNOSIS — Z170 Estrogen receptor positive status [ER+]: Secondary | ICD-10-CM

## 2021-06-01 DIAGNOSIS — E039 Hypothyroidism, unspecified: Secondary | ICD-10-CM | POA: Diagnosis not present

## 2021-06-01 DIAGNOSIS — Z9011 Acquired absence of right breast and nipple: Secondary | ICD-10-CM

## 2021-06-01 DIAGNOSIS — Z79899 Other long term (current) drug therapy: Secondary | ICD-10-CM | POA: Insufficient documentation

## 2021-06-01 HISTORY — PX: MASTECTOMY W/ SENTINEL NODE BIOPSY: SHX2001

## 2021-06-01 LAB — POCT PREGNANCY, URINE: Preg Test, Ur: NEGATIVE

## 2021-06-01 SURGERY — MASTECTOMY WITH SENTINEL LYMPH NODE BIOPSY
Anesthesia: General | Site: Breast | Laterality: Right

## 2021-06-01 MED ORDER — KETOROLAC TROMETHAMINE 15 MG/ML IJ SOLN
15.0000 mg | INTRAMUSCULAR | Status: AC
  Administered 2021-06-01: 15 mg via INTRAVENOUS

## 2021-06-01 MED ORDER — MORPHINE SULFATE (PF) 4 MG/ML IV SOLN: 2.0000 mg | INTRAVENOUS | Status: DC | PRN

## 2021-06-01 MED ORDER — ONDANSETRON HCL 4 MG/2ML IJ SOLN
INTRAMUSCULAR | Status: AC
  Filled 2021-06-01: qty 2

## 2021-06-01 MED ORDER — ONDANSETRON HCL 4 MG/2ML IJ SOLN
INTRAMUSCULAR | Status: DC | PRN
  Administered 2021-06-01: 4 mg via INTRAVENOUS

## 2021-06-01 MED ORDER — CIPROFLOXACIN IN D5W 400 MG/200ML IV SOLN
400.0000 mg | INTRAVENOUS | Status: AC
  Administered 2021-06-01: 400 mg via INTRAVENOUS

## 2021-06-01 MED ORDER — ACETAMINOPHEN 500 MG PO TABS
ORAL_TABLET | ORAL | Status: AC
  Filled 2021-06-01: qty 2

## 2021-06-01 MED ORDER — LIDOCAINE HCL (PF) 2 % IJ SOLN
INTRAMUSCULAR | Status: AC
  Filled 2021-06-01: qty 5

## 2021-06-01 MED ORDER — ONDANSETRON HCL 4 MG/2ML IJ SOLN: 4.0000 mg | Freq: Four times a day (QID) | INTRAMUSCULAR | Status: DC | PRN

## 2021-06-01 MED ORDER — CHLORHEXIDINE GLUCONATE CLOTH 2 % EX PADS: 6.0000 | MEDICATED_PAD | Freq: Once | CUTANEOUS | Status: DC

## 2021-06-01 MED ORDER — PROPOFOL 10 MG/ML IV BOLUS
INTRAVENOUS | Status: AC
  Filled 2021-06-01: qty 20

## 2021-06-01 MED ORDER — ONDANSETRON 4 MG PO TBDP: 4.0000 mg | ORAL_TABLET | Freq: Four times a day (QID) | ORAL | Status: DC | PRN

## 2021-06-01 MED ORDER — OXYCODONE HCL 5 MG PO TABS: 5.0000 mg | ORAL_TABLET | ORAL | Status: DC | PRN

## 2021-06-01 MED ORDER — METHOCARBAMOL 750 MG PO TABS: 750.0000 mg | ORAL_TABLET | Freq: Four times a day (QID) | ORAL | 0 refills | Status: DC | PRN

## 2021-06-01 MED ORDER — NON FORMULARY
Status: DC | PRN
  Administered 2021-06-01: 2 mL via SUBCUTANEOUS

## 2021-06-01 MED ORDER — SIMETHICONE 80 MG PO CHEW: 40.0000 mg | CHEWABLE_TABLET | Freq: Four times a day (QID) | ORAL | Status: DC | PRN

## 2021-06-01 MED ORDER — KETOROLAC TROMETHAMINE 30 MG/ML IJ SOLN: 30.0000 mg | Freq: Once | INTRAMUSCULAR | Status: AC | PRN

## 2021-06-01 MED ORDER — HYDROMORPHONE HCL 1 MG/ML IJ SOLN
0.2500 mg | INTRAMUSCULAR | Status: DC | PRN
Start: 2021-06-01 — End: 2021-06-02

## 2021-06-01 MED ORDER — DEXAMETHASONE SODIUM PHOSPHATE 10 MG/ML IJ SOLN
INTRAMUSCULAR | Status: AC
  Filled 2021-06-01: qty 1

## 2021-06-01 MED ORDER — METHOCARBAMOL 500 MG PO TABS: 500.0000 mg | ORAL_TABLET | Freq: Four times a day (QID) | ORAL | Status: DC | PRN

## 2021-06-01 MED ORDER — LACTATED RINGERS IV SOLN: INTRAVENOUS | Status: DC

## 2021-06-01 MED ORDER — MEPERIDINE HCL 25 MG/ML IJ SOLN: 6.2500 mg | INTRAMUSCULAR | Status: DC | PRN

## 2021-06-01 MED ORDER — FENTANYL CITRATE (PF) 100 MCG/2ML IJ SOLN
INTRAMUSCULAR | Status: DC | PRN
  Administered 2021-06-01: 50 ug via INTRAVENOUS
  Administered 2021-06-01: 25 ug via INTRAVENOUS

## 2021-06-01 MED ORDER — OXYCODONE HCL 5 MG PO TABS: 5.0000 mg | ORAL_TABLET | Freq: Four times a day (QID) | ORAL | 0 refills | Status: DC | PRN

## 2021-06-01 MED ORDER — FENTANYL CITRATE (PF) 100 MCG/2ML IJ SOLN
100.0000 ug | Freq: Once | INTRAMUSCULAR | Status: AC
  Administered 2021-06-01: 100 ug via INTRAVENOUS

## 2021-06-01 MED ORDER — PROMETHAZINE HCL 25 MG/ML IJ SOLN: 6.2500 mg | INTRAMUSCULAR | Status: DC | PRN

## 2021-06-01 MED ORDER — SODIUM CHLORIDE 0.9 % IV SOLN: INTRAVENOUS | Status: DC

## 2021-06-01 MED ORDER — PROPOFOL 10 MG/ML IV BOLUS
INTRAVENOUS | Status: DC | PRN
  Administered 2021-06-01: 130 mg via INTRAVENOUS

## 2021-06-01 MED ORDER — ACETAMINOPHEN 500 MG PO TABS
1000.0000 mg | ORAL_TABLET | ORAL | Status: AC
  Administered 2021-06-01: 1000 mg via ORAL

## 2021-06-01 MED ORDER — MIDAZOLAM HCL 2 MG/2ML IJ SOLN
INTRAMUSCULAR | Status: AC
  Filled 2021-06-01: qty 2

## 2021-06-01 MED ORDER — MIDAZOLAM HCL 2 MG/2ML IJ SOLN
2.0000 mg | Freq: Once | INTRAMUSCULAR | Status: AC
  Administered 2021-06-01: 2 mg via INTRAVENOUS

## 2021-06-01 MED ORDER — DEXAMETHASONE SODIUM PHOSPHATE 4 MG/ML IJ SOLN
INTRAMUSCULAR | Status: DC | PRN
  Administered 2021-06-01: 10 mg via INTRAVENOUS

## 2021-06-01 MED ORDER — KETOROLAC TROMETHAMINE 15 MG/ML IJ SOLN
INTRAMUSCULAR | Status: AC
  Filled 2021-06-01: qty 1

## 2021-06-01 MED ORDER — ACETAMINOPHEN 500 MG PO TABS
1000.0000 mg | ORAL_TABLET | Freq: Four times a day (QID) | ORAL | Status: DC
  Administered 2021-06-02: 1000 mg via ORAL
  Filled 2021-06-01: qty 2

## 2021-06-01 MED ORDER — CIPROFLOXACIN IN D5W 400 MG/200ML IV SOLN
INTRAVENOUS | Status: AC
  Filled 2021-06-01: qty 200

## 2021-06-01 MED ORDER — FENTANYL CITRATE (PF) 100 MCG/2ML IJ SOLN
INTRAMUSCULAR | Status: AC
  Filled 2021-06-01: qty 2

## 2021-06-01 MED ORDER — LIDOCAINE HCL (CARDIAC) PF 100 MG/5ML IV SOSY
PREFILLED_SYRINGE | INTRAVENOUS | Status: DC | PRN
  Administered 2021-06-01: 100 mg via INTRAVENOUS

## 2021-06-01 MED ORDER — TECHNETIUM TC 99M TILMANOCEPT KIT: 1.0000 | PACK | Freq: Once | INTRAVENOUS | Status: DC | PRN

## 2021-06-01 MED ORDER — BUPIVACAINE HCL (PF) 0.25 % IJ SOLN
INTRAMUSCULAR | Status: AC
  Filled 2021-06-01: qty 60

## 2021-06-01 SURGICAL SUPPLY — 78 items
ADH SKN CLS APL DERMABOND .7 (GAUZE/BANDAGES/DRESSINGS) ×1
APL PRP STRL LF DISP 70% ISPRP (MISCELLANEOUS) ×1
APL SKNCLS STERI-STRIP NONHPOA (GAUZE/BANDAGES/DRESSINGS)
APPLIER CLIP 11 MED OPEN (CLIP) ×2
APPLIER CLIP 9.375 MED OPEN (MISCELLANEOUS)
APR CLP MED 11 20 MLT OPN (CLIP) ×1
APR CLP MED 9.3 20 MLT OPN (MISCELLANEOUS)
BENZOIN TINCTURE PRP APPL 2/3 (GAUZE/BANDAGES/DRESSINGS) IMPLANT
BINDER BREAST LRG (GAUZE/BANDAGES/DRESSINGS) IMPLANT
BINDER BREAST MEDIUM (GAUZE/BANDAGES/DRESSINGS) ×2 IMPLANT
BINDER BREAST XLRG (GAUZE/BANDAGES/DRESSINGS) IMPLANT
BINDER BREAST XXLRG (GAUZE/BANDAGES/DRESSINGS) IMPLANT
BIOPATCH RED 1 DISK 7.0 (GAUZE/BANDAGES/DRESSINGS) ×2 IMPLANT
BLADE CLIPPER SURG (BLADE) IMPLANT
BLADE HEX COATED 2.75 (ELECTRODE) ×2 IMPLANT
BLADE SURG 10 STRL SS (BLADE) ×2 IMPLANT
BLADE SURG 15 STRL LF DISP TIS (BLADE) ×1 IMPLANT
BLADE SURG 15 STRL SS (BLADE) ×2
CANISTER SUCT 1200ML W/VALVE (MISCELLANEOUS) ×2 IMPLANT
CHLORAPREP W/TINT 26 (MISCELLANEOUS) ×2 IMPLANT
CLIP APPLIE 11 MED OPEN (CLIP) ×1 IMPLANT
CLIP APPLIE 9.375 MED OPEN (MISCELLANEOUS) IMPLANT
CLIP TI WIDE RED SMALL 6 (CLIP) IMPLANT
COVER BACK TABLE 60X90IN (DRAPES) ×2 IMPLANT
COVER MAYO STAND STRL (DRAPES) ×2 IMPLANT
COVER PROBE W GEL 5X96 (DRAPES) ×4 IMPLANT
DECANTER SPIKE VIAL GLASS SM (MISCELLANEOUS) IMPLANT
DERMABOND ADVANCED (GAUZE/BANDAGES/DRESSINGS) ×1
DERMABOND ADVANCED .7 DNX12 (GAUZE/BANDAGES/DRESSINGS) ×1 IMPLANT
DRAIN CHANNEL 19F RND (DRAIN) ×2 IMPLANT
DRAPE TOP ARMCOVERS (MISCELLANEOUS) ×2 IMPLANT
DRAPE U-SHAPE 76X120 STRL (DRAPES) ×2 IMPLANT
DRAPE UTILITY XL STRL (DRAPES) ×2 IMPLANT
DRSG PAD ABDOMINAL 8X10 ST (GAUZE/BANDAGES/DRESSINGS) ×2 IMPLANT
DRSG TEGADERM 4X4.75 (GAUZE/BANDAGES/DRESSINGS) ×2 IMPLANT
ELECT BLADE 4.0 EZ CLEAN MEGAD (MISCELLANEOUS)
ELECT REM PT RETURN 9FT ADLT (ELECTROSURGICAL) ×2
ELECTRODE BLDE 4.0 EZ CLN MEGD (MISCELLANEOUS) IMPLANT
ELECTRODE REM PT RTRN 9FT ADLT (ELECTROSURGICAL) ×1 IMPLANT
EVACUATOR SILICONE 100CC (DRAIN) ×2 IMPLANT
GAUZE SPONGE 4X4 12PLY STRL LF (GAUZE/BANDAGES/DRESSINGS) IMPLANT
GLOVE SURG ENC MOIS LTX SZ7 (GLOVE) ×2 IMPLANT
GLOVE SURG UNDER POLY LF SZ6.5 (GLOVE) ×2 IMPLANT
GLOVE SURG UNDER POLY LF SZ7 (GLOVE) ×2 IMPLANT
GLOVE SURG UNDER POLY LF SZ7.5 (GLOVE) ×2 IMPLANT
GOWN STRL REUS W/ TWL LRG LVL3 (GOWN DISPOSABLE) ×2 IMPLANT
GOWN STRL REUS W/TWL LRG LVL3 (GOWN DISPOSABLE) ×4
HEMOSTAT ARISTA ABSORB 3G PWDR (HEMOSTASIS) ×2 IMPLANT
LIGHT WAVEGUIDE WIDE FLAT (MISCELLANEOUS) ×2 IMPLANT
NDL SAFETY ECLIPSE 18X1.5 (NEEDLE) IMPLANT
NEEDLE HYPO 18GX1.5 SHARP (NEEDLE)
NEEDLE HYPO 25X1 1.5 SAFETY (NEEDLE) ×2 IMPLANT
NS IRRIG 1000ML POUR BTL (IV SOLUTION) ×2 IMPLANT
PACK BASIN DAY SURGERY FS (CUSTOM PROCEDURE TRAY) ×2 IMPLANT
PENCIL SMOKE EVACUATOR (MISCELLANEOUS) ×2 IMPLANT
PIN SAFETY STERILE (MISCELLANEOUS) ×2 IMPLANT
RETRACTOR ONETRAX LX 90X20 (MISCELLANEOUS) IMPLANT
SHEET MEDIUM DRAPE 40X70 STRL (DRAPES) IMPLANT
SLEEVE SCD COMPRESS KNEE MED (STOCKING) ×2 IMPLANT
SPONGE T-LAP 18X18 ~~LOC~~+RFID (SPONGE) ×4 IMPLANT
SPONGE T-LAP 4X18 ~~LOC~~+RFID (SPONGE) IMPLANT
STAPLER VISISTAT 35W (STAPLE) IMPLANT
STRIP CLOSURE SKIN 1/2X4 (GAUZE/BANDAGES/DRESSINGS) IMPLANT
SUT ETHILON 2 0 FS 18 (SUTURE) ×2 IMPLANT
SUT MNCRL AB 4-0 PS2 18 (SUTURE) IMPLANT
SUT SILK 2 0 SH (SUTURE) ×2 IMPLANT
SUT VIC AB 2-0 SH 27 (SUTURE) ×2
SUT VIC AB 2-0 SH 27XBRD (SUTURE) ×1 IMPLANT
SUT VIC AB 3-0 54X BRD REEL (SUTURE) IMPLANT
SUT VIC AB 3-0 BRD 54 (SUTURE)
SUT VIC AB 3-0 SH 27 (SUTURE)
SUT VIC AB 3-0 SH 27X BRD (SUTURE) IMPLANT
SUT VICRYL 3-0 CR8 SH (SUTURE) ×2 IMPLANT
SYR CONTROL 10ML LL (SYRINGE) IMPLANT
TOWEL GREEN STERILE FF (TOWEL DISPOSABLE) ×4 IMPLANT
TRACER MAGTRACE VIAL (MISCELLANEOUS) ×2 IMPLANT
TUBE CONNECTING 20X1/4 (TUBING) ×2 IMPLANT
YANKAUER SUCT BULB TIP NO VENT (SUCTIONS) ×2 IMPLANT

## 2021-06-01 NOTE — H&P (Signed)
40 y.o. female who is seen today as an office consultation at the request of Dr. Jana Hakim for breast cancer.   This is done via a Optometrist from [employer]. She is here with her son Lancaster. She has 3 children. 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 She is otherwise healthy except for taking Synthroid after a thyroidectomy. She has a family history of breast cancer in a maternal aunt. She underwent a screening mammogram that shows a right breast mass and a distortion. This on mammogram and then on ultrasound measured 6.3 x 4.4 x 2.1 cm. There are 2 satellite nodules associated with this 1 of which has been biopsied. She has no mass or discharge that she noted. Her axillary ultrasound was negative. The satellite nodule is flat epithelial atypia recommended for excision. 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 She is here to discuss her options.  Review of Systems: A complete review of systems was obtained from the patient. I have reviewed this information and discussed as appropriate with the patient. See HPI as well for other ROS.  Review of Systems  All other systems reviewed and are negative.   Medical History: History reviewed. No pertinent past medical history.  Patient Active Problem List  Diagnosis   Follicular neoplasm of thyroid   H/O total thyroidectomy   Malignant neoplasm of upper-outer quadrant of right breast in female, estrogen receptor positive (CMS-HCC)   Vertigo   Postsurgical hypothyroidism   Past Surgical History:  Procedure Laterality Date   THYROIDECTOMY TOTAL    Allergies  Allergen Reactions   Penicillins Rash  Has patient had a PCN reaction causing immediate rash, facial/tongue/throat swelling, SOB or lightheadedness with hypotension: Yes Has patient had a PCN reaction causing severe rash  involving mucus membranes or skin necrosis: No Has patient had a PCN reaction that required hospitalization No Has patient had a PCN reaction occurring within the last 10 years: Yes If all of the above answers are "NO", then may proceed with Cephalosporin use.   Amoxicillin Rash   Current Outpatient Medications on File Prior to Visit  Medication Sig Dispense Refill   citalopram (CELEXA) 40 MG tablet   fluticasone propionate (FLONASE) 50 mcg/actuation nasal spray Place 2 sprays into both nostrils once daily   FUROsemide (LASIX) 20 MG tablet Take 1 tablet by mouth once daily   levothyroxine (SYNTHROID) 88 MCG tablet Take 1 tablet by mouth every morning before breakfast (0630)   olopatadine (PAZEO) 0.7 % ophthalmic solution Apply to eye   levonorgestreL (MIRENA 52 MG) 20 mcg/24 hr (6 years) IUD Insert into the uterus   No current facility-administered medications on file prior to visit.   Family History  Problem Relation Age of Onset   Breast cancer Maternal Aunt    Social History   Tobacco Use  Smoking Status Never Smoker  Smokeless Tobacco Never Used    Social History   Socioeconomic History   Marital status: Married  Tobacco Use   Smoking status: Never Smoker   Smokeless tobacco: Never Used   Objective:   There were no vitals filed for this visit.  There is no height or weight on file to calculate BMI.  Physical Exam Constitutional:  Appearance: Normal appearance.  Eyes:  General: No scleral icterus. Cardiovascular:  Rate and Rhythm: Normal  rate.  Pulmonary:  Effort: Pulmonary effort is normal.  Chest:  Breasts:  Right: Mass present. No nipple discharge.  Left: Normal. No mass or nipple discharge.   Comments: 7x5 mobile nontender upper pole breast mass encompassing both ui and uo quadrants.  Lymphadenopathy:  Upper Body:  Right upper body: No supraclavicular or axillary adenopathy.  Left upper body: No supraclavicular or axillary adenopathy.   Neurological:  Mental Status: She is alert.  Psychiatric:  Mood and Affect: Mood normal.  Behavior: Behavior normal.     Assessment and Plan:  Diagnoses and all orders for this visit:  Malignant neoplasm of upper-outer quadrant of right breast in female, estrogen receptor positive (CMS-HCC)  Right mastectomy, right ax sn biopsy  We discussed the staging and pathophysiology of breast cancer. We discussed all of the different options for treatment for breast cancer including surgery, chemotherapy, radiation therapy, Herceptin, and antiestrogen therapy. We discussed a sentinel lymph node biopsy as she does not appear to having lymph node involvement right now. We discussed the performance of that with injection of radioactive tracer. We discussed that there is a chance of having a positive node with a sentinel lymph node biopsy and we will await the permanent pathology to make any other first further decisions in terms of her treatment. We discussed up to a 5% risk lifetime of chronic shoulder pain as well as lymphedema associated with a sentinel lymph node biopsy. We discussed the options for treatment of the breast cancer which included lumpectomy versus a mastectomy. I do not think she is candidate for lumpectomy. Additionally even if she receives chemotherapy I dont think this will change surgery due to tumor biology. I also dont think in premenopausal woman neoadjuvant endocrine therapy is the best option. . We discussed mastectomy and the postoperative care for that as well. I do not think she is candidate for nsm given size and proximity to the skin. Mastectomy can be followed by reconstruction. She will get recommended radiotherapy after surgery. Chemotherapy also possible given young age and this tumor size.  We discussed the risks of operation including bleeding, infection, possible reoperation. She understands her further therapy will be based on what her stages at the time of her  operation.

## 2021-06-01 NOTE — Op Note (Signed)
Preoperative diagnosis: Clinical stage II right breast cancer Postoperative diagnosis: Same as above Procedure: 1.  Right total mastectomy 2.  Injection of tracer for sentinel lymph node identification 3.  Right deep axillary sentinel lymph node biopsy Surgeon: Dr. Serita Grammes Anesthesia: General with a pectoral block Estimated blood loss: 50 cc Drains: 19 French Blake drain Specimens: 1.  Right breast tissue marked short stitch superior, long stitch lateral 2.  Right deep axillary sentinel lymph nodes with highest count of 1525 Sponge and count was correct x2 at end of operation Disposition recovery stable condition  Indications: This is a 40 year old female who palpated a right breast mass.  On mammography and ultrasound this was a 6.3 cm mass with 2 satellite light nodules noted.  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  The other biopsy was flat epithelial atypia.  We discussed all of her different options and she wanted to proceed with a mastectomy without reconstruction.  Procedure: After informed consent was obtained the patient first was given antibiotics and SCDs were in place.  She underwent a pectoral block with anesthesia.  She was injected with Lymphoseek in the standard periareolar fashion.  I then used an alcohol prep to prep the skin around the areola injected lidocaine in the skin as well as the subareolar space.  I then injected 2 cc of mag trace in the subareolar position without difficulty.  She was then taken to the operating room placed under general anesthesia.  She was prepped and draped in a standard sterile surgical fashion.  Surgical timeout was then performed.  I made a fairly large elliptical incision to encompass the skin that was overlying this large tumor.  I then created flaps of the clavicle, parasternal area, inframammary fold, latissimus laterally.   I remove the breast as well as the pectoralis fashion from the pectoralis muscle and then passed this off the table as a specimen after marking it as above.  I then was able to enter into the axilla.  I was able to identify what appeared to be 2-3 small sentinel lymph nodes that had mag trace in them.  The highest count was listed as above.  Both of these were colored brown indicative of having tracer in them as well.  Once I remove these there was negligible background activity.  I then obtained hemostasis.  I placed a 58 [place] Blake drain and secured this with a 2-0 nylon.  I then closed the dermis with 3-0 Vicryl sutures.  The skin was closed with 4 Monocryl and glue was placed.  She tolerated this well was extubated transferred to recovery stable.

## 2021-06-01 NOTE — Anesthesia Procedure Notes (Signed)
Procedure Name: LMA Insertion Date/Time: 06/01/2021 12:47 PM Performed by: Glory Buff, CRNA Pre-anesthesia Checklist: Patient identified, Emergency Drugs available, Suction available and Patient being monitored Patient Re-evaluated:Patient Re-evaluated prior to induction Oxygen Delivery Method: Circle system utilized Preoxygenation: Pre-oxygenation with 100% oxygen Induction Type: IV induction LMA: LMA inserted LMA Size: 4.0 Number of attempts: 1 Placement Confirmation: positive ETCO2 Tube secured with: Tape Dental Injury: Teeth and Oropharynx as per pre-operative assessment

## 2021-06-01 NOTE — Anesthesia Preprocedure Evaluation (Addendum)
Anesthesia Evaluation  Patient identified by MRN, date of birth, ID band Patient awake    Reviewed: Allergy & Precautions, NPO status , Patient's Chart, lab work & pertinent test results  Airway Mallampati: II  TM Distance: >3 FB Neck ROM: Full    Dental no notable dental hx. (+) Teeth Intact   Pulmonary neg pulmonary ROS,    Pulmonary exam normal breath sounds clear to auscultation       Cardiovascular negative cardio ROS Normal cardiovascular exam Rhythm:Regular Rate:Normal     Neuro/Psych negative neurological ROS     GI/Hepatic negative GI ROS, Neg liver ROS,   Endo/Other  Hypothyroidism   Renal/GU negative Renal ROS  negative genitourinary   Musculoskeletal negative musculoskeletal ROS (+)   Abdominal Normal abdominal exam  (+)   Peds negative pediatric ROS (+)  Hematology negative hematology ROS (+)   Anesthesia Other Findings   Reproductive/Obstetrics negative OB ROS                             Anesthesia Physical  Anesthesia Plan  ASA: II  Anesthesia Plan: General   Post-op Pain Management:  Regional for Post-op pain   Induction: Intravenous  PONV Risk Score and Plan: 2 and Ondansetron, Dexamethasone and Midazolam  Airway Management Planned: LMA  Additional Equipment: None  Intra-op Plan:   Post-operative Plan: Extubation in OR  Informed Consent: I have reviewed the patients History and Physical, chart, labs and discussed the procedure including the risks, benefits and alternatives for the proposed anesthesia with the patient or authorized representative who has indicated his/her understanding and acceptance.     Dental advisory given  Plan Discussed with: CRNA and Surgeon  Anesthesia Plan Comments:         Anesthesia Quick Evaluation

## 2021-06-01 NOTE — Interval H&P Note (Signed)
History and Physical Interval Note:  06/01/2021 12:16 PM  Sophia Moore  has presented today for surgery, with the diagnosis of RIGHT BREAST CANCER.  The various methods of treatment have been discussed with the patient and family. After consideration of risks, benefits and other options for treatment, the patient has consented to  Procedure(s): RIGHT MASTECTOMY WITH AXILLARY SENTINEL LYMPH NODE BIOPSY (Right) as a surgical intervention.  The patient's history has been reviewed, patient examined, no change in status, stable for surgery.  I have reviewed the patient's chart and labs.  Questions were answered to the patient's satisfaction.     Rolm Bookbinder

## 2021-06-01 NOTE — Transfer of Care (Signed)
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  Procedure(s) Performed: RIGHT MASTECTOMY WITH AXILLARY SENTINEL LYMPH NODE BIOPSY (Right: Breast)  Patient Location: PACU  Anesthesia Type:GA combined with regional for post-op pain  Level of Consciousness: drowsy and patient cooperative  Airway & Oxygen Therapy: Patient Spontanous Breathing and Patient connected to face mask oxygen  Post-op Assessment: Report given to RN and Post -op Vital signs reviewed and stable  Post vital signs: Reviewed and stable  Last Vitals:  Vitals Value Taken Time  BP 92/55 06/01/21 1421  Temp    Pulse 53 06/01/21 1422  Resp 15 06/01/21 1422  SpO2 99 % 06/01/21 1422  Vitals shown include unvalidated device data.  Last Pain:  Vitals:   06/01/21 1121  TempSrc: Oral  PainSc: 0-No pain         Complications: No notable events documented.

## 2021-06-01 NOTE — Discharge Instructions (Addendum)
[facility], [place] [phone]  MASTECTOMY: POST OP INSTRUCTIONS Take 400 mg of ibuprofen every 8 hours or 650 mg tylenol every 6 hours for next 72 hours then as needed. Use ice several times daily also. Always review your discharge instruction sheet given to you by the facility where your surgery was performed. IF YOU HAVE DISABILITY OR FAMILY LEAVE FORMS, YOU MUST BRING THEM TO THE OFFICE FOR PROCESSING.   DO NOT GIVE THEM TO YOUR DOCTOR. A prescription for pain medication may be given to you upon discharge.  Take your pain medication as prescribed, if needed.  If narcotic pain medicine is not needed, then you may take acetaminophen (Tylenol), naprosyn (Alleve) or ibuprofen (Advil) as needed. Take your usually prescribed medications unless otherwise directed. If you need a refill on your pain medication, please contact your pharmacy.  They will contact our office to request authorization.  Prescriptions will not be filled after 5pm or on week-ends. You should follow a light diet the first few days after arrival home, such as soup and crackers, etc.  Resume your normal diet the day after surgery. Most patients will experience some swelling and bruising on the chest and underarm.  Ice packs will help.  Swelling and bruising can take several days to resolve. Wear the binder day and night until you return to the office.  It is common to experience some constipation if taking pain medication after surgery.  Increasing fluid intake and taking a stool softener (such as Colace) will usually help or prevent this problem from occurring.  A mild laxative (Milk of Magnesia or Miralax) should be taken according to package instructions if there are no bowel movements after 48 hours. Unless discharge instructions indicate otherwise, leave your bandage dry and in place until your next appointment in 3-5 days.  You may take a limited sponge bath.  No tube baths or showers until the drains are removed.   You may have steri-strips (small skin tapes) in place directly over the incision.  These strips should be left on the skin for 7-10 days. If you have glue it will come off in next couple week.  Any sutures will be removed at an office visit DRAINS:  If you have drains in place, it is important to keep a list of the amount of drainage produced each day in your drains.  Before leaving the hospital, you should be instructed on drain care.  Call our office if you have any questions about your drains. I will remove your drains when they put out less than 30 cc or ml for 2 consecutive days. ACTIVITIES:  You may resume regular (light) daily activities beginning the next day--such as daily self-care, walking, climbing stairs--gradually increasing activities as tolerated.  You may have sexual intercourse when it is comfortable.  Refrain from any heavy lifting or straining until approved by your doctor. You may drive when you are no longer taking prescription pain medication, you can comfortably wear a seatbelt, and you can safely maneuver your car and apply brakes. RETURN TO WORK:  __________________________________________________________ Dennis Bast should see your doctor in the office for a follow-up appointment approximately 3-5 days after your surgery.  Your doctor's nurse will typically make your follow-up appointment when she calls you with your pathology report.  Expect your pathology report 3-4business days after surgery. OTHER INSTRUCTIONS: ______________________________________________________________________________________________ ____________________________________________________________________________________________ WHEN TO CALL YOUR DR [place]KEFIELD: Fever over 101.0 Nausea and/or vomiting Extreme swelling or bruising Continued bleeding from incision. Increased pain, redness,  or drainage from the incision. The clinic staff is available to answer your questions during regular business hours.  Please don't  hesitate to call and ask to speak to one of the nurses for clinical concerns.  If you have a medical emergency, go to the nearest emergency room or call 911.  A surgeon from [facility] is always on call at the hospital. [address] [place], [place], [place]  [address] ? [address], [place], [place]   [address] [phone] ? [phone] ? FAX [phone] Web site: [email or web address]    About my Jackson-Pratt Bulb Drain  What is a Jackson-Pratt bulb? A Jackson-Pratt is a soft, round device used to collect drainage. It is connected to a long, thin drainage catheter, which is held in place by one or two small stiches near your surgical incision site. When the bulb is squeezed, it forms a vacuum, forcing the drainage to empty into the bulb.  Emptying the Jackson-Pratt bulb- To empty the bulb: 1. Release the plug on the top of the bulb. 2. Pour the bulb's contents into a measuring container which your nurse will provide. 3. Record the time emptied and amount of drainage. Empty the drain(s) as often as your     doctor or nurse recommends.  Date                  Time                    Amount (Drain 1)                 Amount (Drain 2)  _____________________________________________________________________  _____________________________________________________________________  _____________________________________________________________________  _____________________________________________________________________  _____________________________________________________________________  _____________________________________________________________________  _____________________________________________________________________  _____________________________________________________________________  Squeezing the Jackson-Pratt Bulb- To squeeze the bulb: 1. Make sure the plug at the top of the bulb is open. 2. Squeeze the bulb tightly in your fist.  You will hear air squeezing from the bulb. 3. Replace the plug while the bulb is squeezed. 4. Use a safety pin to attach the bulb to your clothing. This will keep the catheter from     pulling at the bulb insertion site.  When to call your doctor- Call your doctor if: Drain site becomes red, swollen or hot. You have a fever greater than 101 degrees F. There is oozing at the drain site. Drain falls out (apply a guaze bandage over the drain hole and secure it with tape). Drainage increases daily not related to activity patterns. (You will usually have more drainage when you are active than when you are resting.) Drainage has a bad odor.   Post Anesthesia Home Care Instructions  Activity: Get plenty of rest for the remainder of the day. A responsible individual must stay with you for 24 hours following the procedure.  For the next 24 hours, DO NOT: -Drive a car -Paediatric nurse -Drink alcoholic beverages -Take any medication unless instructed by your physician -Make any legal decisions or sign important papers.  Meals: Start with liquid foods such as gelatin or soup. Progress to regular foods as tolerated. Avoid greasy, spicy, heavy foods. If nausea and/or vomiting occur, drink only clear liquids until the nausea and/or vomiting subsides. Call your physician if vomiting continues.  Special Instructions/Symptoms: Your throat may feel dry or sore from the anesthesia or the breathing tube placed in your throat during surgery. If this causes discomfort, gargle with warm salt water. The discomfort should disappear within 24 hours.  If you  had a scopolamine patch placed behind your ear for the management of post- operative nausea and/or vomiting:  1. The medication in the patch is effective for 72 hours, after which it should be removed.  Wrap patch in a tissue and discard in the trash. Wash hands thoroughly with soap and water. 2. You may remove the patch earlier than 72 hours if you  experience unpleasant side effects which may include dry mouth, dizziness or visual disturbances. 3. Avoid touching the patch. Wash your hands with soap and water after contact with the patch.

## 2021-06-01 NOTE — Progress Notes (Signed)
Assisted Dr. Jillyn Hidden with right, ultrasound guided, pectoralis block. Side rails up, monitors on throughout procedure. See vital signs in flow sheet. Tolerated Procedure well.

## 2021-06-01 NOTE — Anesthesia Postprocedure Evaluation (Signed)
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  Last Vitals:  Vitals:   06/01/21 1445 06/01/21 1500  BP: 96/60 97/62  Pulse: (!) 56 (!) 58  Resp: (!) 22 (!) 22  Temp:    SpO2: 99% 99%    Last Pain:  Vitals:   06/01/21 1445  TempSrc:   PainSc: 0-No pain                 Huston Foley

## 2021-06-02 ENCOUNTER — Encounter (HOSPITAL_BASED_OUTPATIENT_CLINIC_OR_DEPARTMENT_OTHER): Payer: Self-pay | Admitting: General Surgery

## 2021-06-02 NOTE — Discharge Summary (Signed)
Physician Discharge Summary  Patient ID: Sophia Moore MRN: [id number] DOB/AGE: 05/30/1981 40 y.o.  Admit date: 06/01/2021 Discharge date: 06/02/2021  Admission Diagnoses: Right breast cancer  Discharge Diagnoses:  Active Problems:   S/P mastectomy, right   Discharged Condition: good  Hospital Course: 40 yof underwent right mastectomy and sn biopsy. Doing well following am and will be discharged  Consults: None  Significant Diagnostic Studies: none  Treatments: surgery: right mastectomy, right ax sn biopsy  Discharge Exam: Blood pressure (!) 98/57, pulse 60, temperature 98.4 F (36.9 C), resp. rate 16, height '5\' 2"'$  (1.575 m), weight 53.9 kg, last menstrual period 05/06/2021, SpO2 97 %. Flaps viable without hematoma, drain as expected  Disposition: Discharge disposition: 01-Home or Self Care        Allergies as of 06/02/2021       Reactions   Amoxicillin Rash   Penicillins Rash   Has patient had a PCN reaction causing immediate rash, facial/tongue/throat swelling, SOB or lightheadedness with hypotension: Yes Has patient had a PCN reaction causing severe rash involving mucus membranes or skin necrosis: No Has patient had a PCN reaction that required hospitalization No Has patient had a PCN reaction occurring within the last 10 years: Yes If all of the above answers are "NO", then may proceed with Cephalosporin use.        Medication List     STOP taking these medications    tamoxifen 20 MG tablet Commonly known as: NOLVADEX       TAKE these medications    levothyroxine 88 MCG tablet Commonly known as: SYNTHROID TAKE 1 TABLET BY MOUTH EVERY DAY BEFORE BREAKFAST   methocarbamol 750 MG tablet Commonly known as: ROBAXIN Take 1 tablet (750 mg total) by mouth 4 (four) times daily as needed (use for muscle cramps/pain).   oxyCODONE 5 MG immediate release tablet Commonly known as: Oxy IR/ROXICODONE Take 1 tablet (5 mg total) by mouth every 6  (six) hours as needed.        Follow-up Information     Rolm Bookbinder, MD Follow up in 2 week(s).   Specialty: General Surgery Contact information: [place] [address] [place] [address] [phone]                 Signed: Rolm Bookbinder 06/02/2021, 8:52 AM

## 2021-06-07 LAB — SURGICAL PATHOLOGY

## 2021-06-09 ENCOUNTER — Encounter: Payer: Self-pay | Admitting: *Deleted

## 2021-06-15 ENCOUNTER — Other Ambulatory Visit: Payer: Self-pay | Admitting: Oncology

## 2021-06-15 ENCOUNTER — Encounter: Payer: Self-pay | Admitting: *Deleted

## 2021-06-19 NOTE — Progress Notes (Signed)
[place]  Telephone:([phone]) [phone] Fax:(336) [phone]     ID: Sophia Moore DOB: Oct 30, 1980  MR#: [id number]  SWN#:[id number]  Patient Care Team: [facility] as PCP - General (Family Medicine) Mauro Kaufmann, RN as Oncology Nurse Navigator Rockwell Germany, RN as Oncology Nurse Navigator Rolm Bookbinder, MD as Consulting Physician (General Surgery) Graceanne Guin, Virgie Dad, MD as Consulting Physician (Oncology) Eppie Gibson, MD as Attending Physician (Radiation Oncology) Chauncey Cruel, MD OTHER MD:  CHIEF COMPLAINT: Estrogen receptor positive breast cancer  CURRENT TREATMENT: Tamoxifen   INTERVAL HISTORY: Sophia Moore returns today for follow up of Sophia estrogen receptor positive breast cancer. She was evaluated in the multidisciplinary breast cancer clinic on 05/04/2021.  She is accompanied by Sophia Moore.  She underwent genetic counseling during clinic. Results were negative, with a variant of uncertain significance in [facility].  She was started on tamoxifen at consultation in anticipation of possible surgical delays.  She is tolerating this well, with no issues regarding hot flashes or vaginal discharge.  Recall she is premenopausal  She proceeded to right mastectomy on 06/01/2021 under Dr. Donne Hazel. 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  Both biopsied lymph nodes were positive for metastatic carcinoma (2/2), one of which showed extracapsular extension.  Sophia case was discussed at the multidisciplinary breast cancer conference 06/15/2021.  It was acknowledged that the standard of care in this situation is completion axillary lymph node dissection.  The concern was raised that this was unlikely to affect the patient survival but would put Sophia at risk of permanent disability  secondary to lymphedema.  On the other hand if we did further axillary dissection and 2 more lymph nodes were positive then the patient's tumor would be outside the range of MammaPrint and she would need chemotherapy.  The patient is here today to discuss Sophia options further   REVIEW OF SYSTEMS: Sophia Moore did well with Sophia surgery, with minimal pain, no significant bleeding.  She thinks she kept the drain about a days.  She sleeps on the right side without difficulty although it feels a little different.  She is doing some stretching exercises 3 times a day but she can only AB duct the right upper extremity to about 95 degrees.  She is already scheduled for physical therapy beginning 06/23/2021.  A detailed review of systems today was otherwise noncontributory   COVID 19 VACCINATION STATUS: not vaccinated, infection 05/2019 (results in [place])   HISTORY OF CURRENT ILLNESS: From the original intake note:  [name] had routine screening mammography (Sophia first ever) on 03/22/2021 showing a possible abnormality in the right breast. She underwent right diagnostic mammography with tomography and right breast ultrasonography at [facility] on 04/19/2021 showing: breast density category C; palpable 6.3 cm mass involving the central and upper half of the right breast; 2-4 satellite nodules within upper-outer quadrant; no right axillary adenopathy.  Accordingly on 04/26/2021 she proceeded to biopsy of the right breast area in question. 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 Proliferation marker Ki67 at 5%. HER2 equivocal by immunohistochemistry (2+), but negative by fluorescent in situ  hybridization with a signals ratio 1.2 and  number per cell 1.8. 2. Right Breast, upper-outer axillary tail  - flat epithelial atypia with calcifications  Cancer Staging Malignant neoplasm of upper-outer quadrant of right breast in female, estrogen receptor positive ([place]) Staging form: Breast, AJCC 8th Edition - Clinical stage from 05/04/2021: Stage IIA (cT3, cN0, cM0, G2, ER+, PR+, HER2-) - Signed by Chauncey Cruel, MD on 05/04/2021 Stage prefix: Initial diagnosis Histologic grading system: 3 grade system Laterality: Right Staged by: Pathologist and managing physician Stage used in treatment planning: Yes National guidelines used in treatment planning: Yes Type of national guideline used in treatment planning: NCCN  The patient's subsequent history is as detailed below.   PAST MEDICAL HISTORY: Past Medical History:  Diagnosis Date   Depression    takes Citalopram daily   Family history of breast cancer    Thyroid nodule 07/2016    PAST SURGICAL HISTORY: Past Surgical History:  Procedure Laterality Date   MASTECTOMY W/ SENTINEL NODE BIOPSY Right 06/01/2021   Procedure: RIGHT MASTECTOMY WITH AXILLARY SENTINEL LYMPH NODE BIOPSY;  Surgeon: Rolm Bookbinder, MD;  Location: [facility];  Service: General;  Laterality: Right;   THYROIDECTOMY  09/13/2016   THYROIDECTOMY N/A 09/13/2016   Procedure: TOTAL THYROIDECTOMY;  Surgeon: Leta Baptist, MD;  Location: [facility];  Service: ENT;  Laterality: N/A;   wisdom teeth extracted      WISDOM TOOTH EXTRACTION      FAMILY HISTORY: Family History  Problem Relation Age of Onset   Hyperlipidemia Mother    Stroke Mother    Breast cancer Maternal Aunt        dx 54s   Sophia parents are both living, Sophia father age 27 and Sophia mother age 93, as of 04/2021. Sophia Moore has two brothers and three sisters. She reports breast cancer in a maternal aunt (age 15+).   GYNECOLOGIC HISTORY:  No LMP recorded. Menarche: unsure Age at first live  birth: 40 years old [place] P 3 LMP 04/13/2021 Contraceptive never used HRT n/a  Hysterectomy? no BSO? 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  She is originally from the [place] section of [place].  Husband Moore work in Architect. She lives at home with daughter Sophia Moore, age 65, and [name], age 12. Sophia oldest [name], age 41, works at [employer]. She attends a [employer].    ADVANCED DIRECTIVES: In the absence of any documentation to the contrary, the patient's spouse is their HCPOA.    HEALTH MAINTENANCE: Social History   Tobacco Use   Smoking status: Never   Smokeless tobacco: Never  Vaping Use   Vaping Use: Never used  Substance Use Topics   Alcohol use: No   Drug use: No     Colonoscopy: n/a (age)  PAP: 03/2021  Bone density: n/a (age)   Allergies  Allergen Reactions   Amoxicillin Rash   Penicillins Rash    Has patient had a PCN reaction causing immediate rash, facial/tongue/throat swelling, SOB or lightheadedness with hypotension: Yes Has patient had a PCN reaction causing severe rash involving mucus membranes or skin necrosis: No Has patient had a PCN reaction that required hospitalization No Has patient had a PCN reaction occurring within the last 10 years: Yes If all of the above answers are "NO", then may proceed with Cephalosporin use.     Current Outpatient Medications  Medication Sig Dispense Refill  levothyroxine (SYNTHROID) 88 MCG tablet TAKE 1 TABLET BY MOUTH EVERY DAY BEFORE BREAKFAST 90 tablet 1   methocarbamol (ROBAXIN) 750 MG tablet Take 1 tablet (750 mg total) by mouth 4 (four) times daily as needed (use for muscle cramps/pain). 30 tablet 0   oxyCODONE (OXY IR/ROXICODONE) 5 MG immediate release tablet Take 1 tablet (5 mg total) by mouth every 6 (six) hours as needed. 10 tablet 0   No current facility-administered medications  for this visit.    OBJECTIVE: Spanish speaker who appears stated age  65:   06/20/21 1625  BP: 118/64  Pulse: 67  Resp: 20  Temp: (!) 97.2 F (36.2 C)  SpO2: 100%     Body mass index is 21.44 kg/m.   Wt Readings from Last 3 Encounters:  06/20/21 117 lb 3.2 oz (53.2 kg)  06/01/21 118 lb 13.3 oz (53.9 kg)  05/04/21 118 lb 1.6 oz (53.6 kg)      ECOG FS:1 - Symptomatic but completely ambulatory  Sclerae unicteric, EOMs intact Wearing a mask No cervical or supraclavicular adenopathy Lungs no rales or rhonchi Heart regular rate and rhythm Abd soft, nontender, positive bowel sounds MSK no focal spinal tenderness, no upper extremity lymphedema Neuro: nonfocal, well oriented, appropriate affect Breasts: The right breast is status post mastectomy.  The incision is flat and healing nicely, with no erythema swelling or dehiscence.  There is no evidence of local recurrence.  The left breast and both axillae are benign.   LAB RESULTS:  CMP     Component Value Date/Time   NA 141 05/04/2021 0832   K 3.9 05/04/2021 0832   CL 107 05/04/2021 0832   CO2 27 05/04/2021 0832   GLUCOSE 86 05/04/2021 0832   BUN 13 05/04/2021 0832   CREATININE 0.77 05/04/2021 0832   CALCIUM 9.1 05/04/2021 0832   PROT 6.8 05/04/2021 0832   ALBUMIN 3.6 05/04/2021 0832   AST 16 05/04/2021 0832   ALT 7 05/04/2021 0832   ALKPHOS 53 05/04/2021 0832   BILITOT 0.7 05/04/2021 0832   GFRNONAA >60 05/04/2021 0832    No results found for: TOTALPROTELP, ALBUMINELP, A1GS, A2GS, BETS, BETA2SER, GAMS, MSPIKE, SPEI  Lab Results  Component Value Date   WBC 4.5 05/04/2021   NEUTROABS 2.5 05/04/2021   HGB 14.9 05/04/2021   HCT 42.2 05/04/2021   MCV 85.1 05/04/2021   PLT 258 05/04/2021    No results found for: LABCA2  No components found for: UVOZDG644  No results for input(s): INR in the last 168 hours.  No results found for: LABCA2  No results found for: IHK742  No results found for: VZD638  No  results found for: VFI433  No results found for: CA2729  No components found for: HGQUANT  No results found for: CEA1 / No results found for: CEA1   No results found for: AFPTUMOR  No results found for: CHROMOGRNA  No results found for: KPAFRELGTCHN, LAMBDASER, KAPLAMBRATIO (kappa/lambda light chains)  No results found for: HGBA, HGBA2QUANT, HGBFQUANT, HGBSQUAN (Hemoglobinopathy evaluation)   No results found for: LDH  No results found for: IRON, TIBC, IRONPCTSAT (Iron and TIBC)  No results found for: FERRITIN  Urinalysis No results found for: COLORURINE, APPEARANCEUR, LABSPEC, PHURINE, GLUCOSEU, HGBUR, BILIRUBINUR, KETONESUR, PROTEINUR, UROBILINOGEN, NITRITE, LEUKOCYTESUR   STUDIES: NM Sentinel Node Inj-No Rpt (Breast)  Result Date: 06/01/2021 Sulfur Colloid was injected by the Nuclear Medicine Technologist for sentinel lymph node localization.     ELIGIBLE FOR AVAILABLE RESEARCH PROTOCOL: no  ASSESSMENT:  40 y.o. Spanish speaker status post right breast upper outer quadrant biopsy 04/26/2021 for a clinical mT3 N0, stage IIA invasive ductal carcinoma, grade 1 or 2, estrogen and progesterone receptor positive, HER2 not amplified, with an MIB-1 of 5%.  (1) genetics testing 05/16/2021 through the [employer] panel (report date 05/11/2021) or CancerNext-Expanded + RNAinsight panel (report date 05/16/2021).   The BRCAplus panel offered by [employer] and includes sequencing and deletion/duplication analysis for the following 8 genes: ATM, BRCA1, BRCA2, CDH1, CHEK2, PALB2, PTEN, and TP53. The CancerNext-Expanded + RNAinsight gene panel offered by [employer] and includes sequencing and rearrangement analysis for the following 77 genes: AIP, ALK, APC, ATM, AXIN2, BAP1, BARD1, BLM, BMPR1A, BRCA1, BRCA2, BRIP1, CDC73, CDH1, CDK4, CDKN1B, CDKN2A, CHEK2, CTNNA1, DICER1, FANCC, FH, FLCN, GALNT12, KIF1B, LZTR1, MAX, MEN1, MET, MLH1, MSH2, MSH3, MSH6, MUTYH, NBN, NF1, NF2, NTHL1,  PALB2, PHOX2B, PMS2, POT1, PRKAR1A, PTCH1, PTEN, RAD51C, RAD51D, RB1, RECQL, RET, SDHA, SDHAF2, SDHB, SDHC, SDHD, SMAD4, SMARCA4, SMARCB1, SMARCE1, STK11, SUFU, TMEM127, TP53, TSC1, TSC2, VHL and XRCC2 (sequencing and deletion/duplication); EGFR, EGLN1, HOXB13, KIT, MITF, PDGFRA, POLD1 and POLE (sequencing only); EPCAM and GREM1 (deletion/duplication only). RNA data is routinely analyzed for use in variant interpretation for all genes.  (A) A variant of uncertain significance (VUS) was detected in the [facility] gene called p.R1369H (c.4106G>A).  (2) MammaPrint obtained from the 04/26/2021 biopsy returned luminal a type, low risk, with a predicted benefit from additional chemotherapy in the 1-2% range [given the patient's young age however, the benefit of chemotherapy might be as high as 5%].  (3) status post right mastectomy and sentinel lymph node sampling 06/01/2021 for an mpT3 pN1-2, stage IIA invasive ductal carcinoma, grade 1, with negative margins  (A) a total of 2 right axillary lymph nodes were removed, both positive, 1 with extracapsular extension  (B) completion axillary dissection being considered  (3) adjuvant radiation being considered  (4) adjuvant radiation  (5) tamoxifen started 05/04/2021 in anticipation of surgical delays   PLAN: I reviewed Sophia Moore's situation in detail with Sophia and Sophia husband.  She understands she has a fairly large tumor with at least 2 axillary lymph nodes involved.  We reviewed the fact that the standard of care in these cases is completion axillary node dissection.  We are concerned because there is a risk of lymphedema which may result in disability and I quoted Sophia a risk in the 10 to 15% range for significant lymphedema following this surgery.  In conference the consensus was that axillary lymph node dissection by itself is unlikely to make any difference to Sophia overall survival.  We know that radiation and antiestrogens (as well as chemotherapy) can have  a significant effect on local recurrence risk.  However the concern is that if she had 2 more positive lymph nodes then she would be outside of the MammaPrint criteria (she is already at the very edge of it) and therefore would need chemotherapy.  In the view of our group this is the chief reason for considering completion axillary dissection in Sophia case.  The possibility was then brought up whether the patient might opt for chemotherapy without further surgery in which case we could avoid completion axillary lymph node dissection.  We discussed the chemotherapy she would receive in detail namely docetaxel and cyclophosphamide.  This has multiple side effects but basically 3 potentially permanent complications: The first is permanent alopecia and the risk of that is 3-6%.  A second is permanent peripheral neuropathy and we  would minimize that as much as possible by checking a careful review of systems before each cycle of treatment, despite which in some cases significant neuropathy develops.  The final possible permanent complication is early menopause.  I quoted Sophia a 40% of that occurring and she understands this is associated with increased risk of cardiac and bone problems.  Sophia Moore therefore faces four possibilities.  One is to refuse both the chemo and the surgery.  I do not recommend that because our ultimate goal here is Moore and we would lose that if she has a systemic recurrence.  A second possibility is to proceed to right completion axillary dissection.  This means if she has no other or at most 1 other involved lymph node she could avoid chemotherapy.  On the other hand if she does have 2 or more additional lymph nodes positive she would have the complications of both the surgery and the chemotherapy.    A third possibility is to proceed to chemotherapy as described and avoid the axillary dissection.  All this information was given to the patient in writing and I confirmed that both she and  Sophia husband have an excellent understanding of the issues involved.  Both of them are thankful for the fact that we are giving them a choice as opposed to simply proceeding mechanically with "standard of care" but they understand this does place an enormous burden on them.  The patient tells me she feels she will be able to make a decision within the next week.  I gave Sophia my phone number so that she can call me directly when she decides.  Total encounter time 55 minutes.Sarajane Jews C. Cameron Schwinn, MD 06/20/2021 6:22 PM Medical Oncology and Hematology [facility] [place], [place] [address] Tel. [phone]    Fax. [phone]   This document serves as a record of services personally performed by Lurline Del, MD. It was created on his behalf by Wilburn Mylar, a trained medical scribe. The creation of this record is based on the scribe's personal observations and the provider's statements to them.   I, Lurline Del MD, have reviewed the above documentation for accuracy and completeness, and I agree with the above.   *Total Encounter Time as defined by the Centers for Medicare and Medicaid Services includes, in addition to the face-to-face time of a patient visit (documented in the note above) non-face-to-face time: obtaining and reviewing outside history, ordering and reviewing medications, tests or procedures, care coordination (communications with other health care professionals or caregivers) and documentation in the medical record.

## 2021-06-20 ENCOUNTER — Other Ambulatory Visit: Payer: Self-pay

## 2021-06-20 ENCOUNTER — Inpatient Hospital Stay: Payer: BC Managed Care – PPO | Attending: Oncology | Admitting: Oncology

## 2021-06-20 VITALS — BP 118/64 | HR 67 | Temp 97.2°F | Resp 20 | Wt 117.2 lb

## 2021-06-20 DIAGNOSIS — Z823 Family history of stroke: Secondary | ICD-10-CM | POA: Diagnosis not present

## 2021-06-20 DIAGNOSIS — R5383 Other fatigue: Secondary | ICD-10-CM | POA: Insufficient documentation

## 2021-06-20 DIAGNOSIS — Z79810 Long term (current) use of selective estrogen receptor modulators (SERMs): Secondary | ICD-10-CM | POA: Diagnosis not present

## 2021-06-20 DIAGNOSIS — Z803 Family history of malignant neoplasm of breast: Secondary | ICD-10-CM | POA: Insufficient documentation

## 2021-06-20 DIAGNOSIS — Z5111 Encounter for antineoplastic chemotherapy: Secondary | ICD-10-CM | POA: Diagnosis present

## 2021-06-20 DIAGNOSIS — G629 Polyneuropathy, unspecified: Secondary | ICD-10-CM | POA: Insufficient documentation

## 2021-06-20 DIAGNOSIS — E860 Dehydration: Secondary | ICD-10-CM | POA: Diagnosis not present

## 2021-06-20 DIAGNOSIS — R197 Diarrhea, unspecified: Secondary | ICD-10-CM | POA: Diagnosis not present

## 2021-06-20 DIAGNOSIS — C50411 Malignant neoplasm of upper-outer quadrant of right female breast: Secondary | ICD-10-CM | POA: Diagnosis present

## 2021-06-20 DIAGNOSIS — Z880 Allergy status to penicillin: Secondary | ICD-10-CM | POA: Insufficient documentation

## 2021-06-20 DIAGNOSIS — Z170 Estrogen receptor positive status [ER+]: Secondary | ICD-10-CM | POA: Diagnosis not present

## 2021-06-20 DIAGNOSIS — Z8349 Family history of other endocrine, nutritional and metabolic diseases: Secondary | ICD-10-CM | POA: Insufficient documentation

## 2021-06-20 DIAGNOSIS — R232 Flushing: Secondary | ICD-10-CM | POA: Diagnosis not present

## 2021-06-20 DIAGNOSIS — Z9011 Acquired absence of right breast and nipple: Secondary | ICD-10-CM | POA: Insufficient documentation

## 2021-06-21 ENCOUNTER — Encounter: Payer: Self-pay | Admitting: *Deleted

## 2021-06-23 ENCOUNTER — Other Ambulatory Visit: Payer: Self-pay

## 2021-06-23 ENCOUNTER — Ambulatory Visit: Payer: BC Managed Care – PPO | Attending: General Surgery | Admitting: Physical Therapy

## 2021-06-23 ENCOUNTER — Other Ambulatory Visit: Payer: Self-pay | Admitting: Oncology

## 2021-06-23 ENCOUNTER — Encounter: Payer: Self-pay | Admitting: Physical Therapy

## 2021-06-23 DIAGNOSIS — Z170 Estrogen receptor positive status [ER+]: Secondary | ICD-10-CM

## 2021-06-23 DIAGNOSIS — Z9011 Acquired absence of right breast and nipple: Secondary | ICD-10-CM

## 2021-06-23 DIAGNOSIS — Z483 Aftercare following surgery for neoplasm: Secondary | ICD-10-CM

## 2021-06-23 DIAGNOSIS — R293 Abnormal posture: Secondary | ICD-10-CM

## 2021-06-23 DIAGNOSIS — C50411 Malignant neoplasm of upper-outer quadrant of right female breast: Secondary | ICD-10-CM

## 2021-06-23 DIAGNOSIS — M25611 Stiffness of right shoulder, not elsewhere classified: Secondary | ICD-10-CM | POA: Insufficient documentation

## 2021-06-23 MED ORDER — LIDOCAINE-PRILOCAINE 2.5-2.5 % EX CREA: TOPICAL_CREAM | CUTANEOUS | 3 refills | Status: DC

## 2021-06-23 MED ORDER — PROCHLORPERAZINE MALEATE 10 MG PO TABS: 10.0000 mg | ORAL_TABLET | Freq: Four times a day (QID) | ORAL | 1 refills | Status: DC | PRN

## 2021-06-23 MED ORDER — DEXAMETHASONE 4 MG PO TABS: 8.0000 mg | ORAL_TABLET | Freq: Two times a day (BID) | ORAL | 1 refills | Status: DC

## 2021-06-23 NOTE — Progress Notes (Signed)
START ON PATHWAY REGIMEN - Breast     A cycle is every 21 days:     Docetaxel      Cyclophosphamide   **Always confirm dose/schedule in your pharmacy ordering system**  Patient Characteristics: Postoperative without Neoadjuvant Therapy (Pathologic Staging), Invasive Disease, Adjuvant Therapy, HER2 Negative/Unknown/Equivocal, ER Positive, Node Positive, Node Positive (4+) Therapeutic Status: Postoperative without Neoadjuvant Therapy (Pathologic Staging) AJCC Grade: G1 AJCC N Category: pN2 AJCC M Category: cM0 ER Status: Positive (+) AJCC 8 Stage Grouping: IB HER2 Status: Negative (-) Oncotype Dx Recurrence Score: Not Appropriate AJCC T Category: pT3 PR Status: Positive (+) Adjuvant Therapy Status: No Adjuvant Therapy Received Yet or Changing Initial Adjuvant Regimen due to Tolerance Intent of Therapy: Curative Intent, Discussed with Patient

## 2021-06-23 NOTE — Patient Instructions (Addendum)
            [facility]         [address], [place] [address]         [phone]         Annia Friendly, PT, CLT   After Breast Cancer Class It is recommended you attend the ABC class to be educated on lymphedema risk reduction. This class is free of charge and lasts for 1 hour. It is a 1-time class.  You are scheduled for October 17th at 11:00. We will send you a Webex link to your email. You need to download the Webex app on your phone.  Scar massage Once your steri-strips come off, you can begin gentle scar massage to your incision.  Compression garment You do not need a compression bra because you don't have swelling. You can order a free knitted knocker (breast prosthesis) from [email or web address].  Home exercise Program Continue doing your exercises at home. We will add on to those when you come to physical therapy.  Follow up PT: It is recommended you return every 3 months for the first 3 years following surgery to be assessed on the SOZO machine for an L-Dex score. This helps prevent clinically significant lymphedema in 95% of patients. These follow up screens are 10 minute appointments that you are not billed for. WE ARE SCHEDULED TO MOVE TO [place] July 11, 2021. APPOINTMENTS FOR SOZO SCREENS AFTER 07/11/2021 WILL BE LOCATED AT [facility] AT [address] [place] [address]. Please call us to confirm we have moved if your appointment is scheduled after October 3rd, 2022. The phone number is [phone]. You are scheduled for November 28th at 9:00.  Despus de la clase de cncer de mama Se recomienda que asista a la clase ABC para recibir [employer] reduccin del riesgo de [place]. Esta clase es gratuita y tiene una duracin de 1 hora. Es una clase de 1 vez. Ests programado para el 66 de octubre a las 11:00. Le enviaremos un enlace de Webex a su correo electrnico. Debe descargar la aplicacin Webex en  su telfono.  masaje cicatriz Una vez que se hayan quitado las tiras estriles, puede comenzar a Community education officer suavemente la cicatriz de la incisin.  Prenda de compresin No necesita un sostn de compresin porque no tiene hinchazn. Puede solicitar una aldaba de punto (prtesis mamaria) gratuita en [email or web address].  Programa de ejercicios en casa Contine haciendo sus ejercicios en casa. Agregaremos ms cuando venga a la fisioterapia.  PT de seguimiento: Se recomienda que regrese cada 3 meses durante los primeros 3 aos posteriores a la ciruga para que lo evalen en la mquina SOZO para obtener una puntuacin L-Dex. Esto ayuda a IT consultant significativo en el 95% de los pacientes. Estas pantallas de seguimiento son citas de 10 minutos que no se le facturan. ESTAMOS PROGRAMADOS PARA MUDARNOS A UNA NUEVA CLNICA EL [place] DE 2022. LAS CITAS PARA SOZO SCREENS DESPUS DEL 07/11/2021 SE UBICARN EN NUESTRA NUEVA CLNICA EN [address] [place] [address]. Llmenos para confirmar que nos hemos mudado si su cita est programada despus del 3 de octubre de 2022. El nmero de telfono es [phone]. Ests programado para el 28 de noviembre a las 9:00.

## 2021-06-23 NOTE — Therapy (Signed)
[place], [place], [address] Phone: [phone]   Fax:  [phone]  Physical Therapy Treatment  Patient Details  Name: Sophia Moore MRN: [id number] Date of Birth: 18-Sep-1981 Referring Provider (PT): Dr. Wyvonnia Lora   Encounter Date: 06/23/2021   PT End of Session - 06/23/21 1150     Visit Number 2    Number of Visits 10    Date for PT Re-Evaluation 07/21/21    PT Start Time 3664    PT Stop Time 4034    PT Time Calculation (min) 53 min    Activity Tolerance Patient tolerated treatment well    Behavior During Therapy [facility] for tasks assessed/performed             Past Medical History:  Diagnosis Date   Depression    takes Citalopram daily   Family history of breast cancer    Thyroid nodule 07/2016    Past Surgical History:  Procedure Laterality Date   MASTECTOMY W/ SENTINEL NODE BIOPSY Right 06/01/2021   Procedure: RIGHT MASTECTOMY WITH AXILLARY SENTINEL LYMPH NODE BIOPSY;  Surgeon: Rolm Bookbinder, MD;  Location: [place];  Service: General;  Laterality: Right;   THYROIDECTOMY  09/13/2016   THYROIDECTOMY N/A 09/13/2016   Procedure: TOTAL THYROIDECTOMY;  Surgeon: Leta Baptist, MD;  Location: [facility];  Service: ENT;  Laterality: N/A;   wisdom teeth extracted      WISDOM TOOTH EXTRACTION      There were no vitals filed for this visit.   Subjective Assessment - 06/23/21 1119     Subjective Patient reports she underwent a right mastectomy and sentinel node biopsy (2 of 2 positive nodes) on 06/01/2021. Drains were removed 06/09/2021. She is trying to decide about whether to have an ALND or chemotherapy followed by radiation.    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 She underwent a right mastectomy and sentinel node biopsy (2 of 2 positive nodes) on 06/01/2021.  It is ER/PR positive and  HER2 negative with a Ki67 of 5%. She had a thyroidectomy in 2017.    Patient Stated Goals See how my arm is doing    Currently in Pain? Yes    Pain Score 1     Pain Location Flank    Pain Orientation Right    Pain Descriptors / Indicators Aching;Tightness    Pain Type Surgical pain    Pain Onset 1 to 4 weeks ago    Pain Frequency Intermittent    Aggravating Factors  Fabric rubbing the skin    Pain Relieving Factors Nothing                [facility] PT Assessment - 06/23/21 0001       Assessment   Medical Diagnosis s/p right mastectomy and SLNB    Referring Provider (PT) Dr. Wyvonnia Lora    Onset Date/Surgical Date 06/01/21    Hand Dominance Right    Prior Therapy Baseline      Precautions   Precautions Other (comment)    Precaution Comments recent surgery; right arm lymphedema risk; speaks Spanish      Restrictions   Weight Bearing Restrictions No      Balance Screen   Has the patient fallen in the past 6 months No    Has the patient had a decrease in activity level because of a fear of falling?  No    Is the  patient reluctant to leave their home because of a fear of falling?  No      Home Environment   Living Environment Private residence    Living Arrangements Spouse/significant other;Children   47, 60, and 76 y.o. kids   Available Help at Discharge Family      Prior Function   Level of Independence Independent    Vocation Full time employment    Vocation Requirements Not currently working    Leisure She is walking 5 miles 5x/week      Cognition   Overall Cognitive Status Within Functional Limits for tasks assessed      Observation/Other Assessments   Observations Right chest incision appears to be healing well. No edema r redness present. Slightly tender to palpation along right flank. No axillary cording present.      Posture/Postural Control   Posture/Postural Control Postural limitations    Postural Limitations Rounded Shoulders;Forward head       ROM / Strength   AROM / PROM / Strength AROM      AROM   AROM Assessment Site Shoulder    Right/Left Shoulder Right    Right Shoulder Extension 50 Degrees    Right Shoulder Flexion 97 Degrees    Right Shoulder ABduction 107 Degrees    Right Shoulder Internal Rotation 60 Degrees    Right Shoulder External Rotation 86 Degrees               LYMPHEDEMA/ONCOLOGY QUESTIONNAIRE - 06/23/21 0001       Type   Cancer Type Right breast cancer      Surgeries   Mastectomy Date 06/01/21    Sentinel Lymph Node Biopsy Date 06/01/21    Number Lymph Nodes Removed 2      Treatment   Active Chemotherapy Treatment No    Past Chemotherapy Treatment No    Active Radiation Treatment No    Past Radiation Treatment No    Current Hormone Treatment No    Past Hormone Therapy No      What other symptoms do you have   Are you Having Heaviness or Tightness Yes    Are you having Pain Yes    Are you having pitting edema No    Is it Hard or Difficult finding clothes that fit No    Do you have infections No    Is there Decreased scar mobility Yes    Stemmer Sign No      Lymphedema Assessments   Lymphedema Assessments Upper extremities      Right Upper Extremity Lymphedema   10 cm Proximal to Olecranon Process 22.4 cm    Olecranon Process 21 cm    10 cm Proximal to Ulnar Styloid Process 19.4 cm    Just Proximal to Ulnar Styloid Process 14.5 cm    Across Hand at [employer] 17.8 cm    At [place] of 2nd Digit 5.8 cm      Left Upper Extremity Lymphedema   10 cm Proximal to Olecranon Process 22.4 cm    Olecranon Process 20.3 cm    10 cm Proximal to Ulnar Styloid Process 18.8 cm    Just Proximal to Ulnar Styloid Process 13.7 cm    Across Hand at [employer] 17.6 cm    At [place] of 2nd Digit 5.6 cm                Quick Dash - 06/23/21 0001     Open a tight or new jar  No difficulty    Do heavy household chores (wash walls, wash floors) No difficulty    Carry a shopping bag or  briefcase No difficulty    Wash your back Moderate difficulty    Use a knife to cut food Mild difficulty    Recreational activities in which you take some force or impact through your arm, shoulder, or hand (golf, hammering, tennis) Mild difficulty    During the past week, to what extent has your arm, shoulder or hand problem interfered with your normal social activities with family, friends, neighbors, or groups? Slightly    During the past week, to what extent has your arm, shoulder or hand problem limited your work or other regular daily activities Not at all    Arm, shoulder, or hand pain. Moderate    Tingling (pins and needles) in your arm, shoulder, or hand None    Difficulty Sleeping Mild difficulty    DASH Score 18.18 %                             PT Education - 06/23/21 1150     Education Details Lymphedema risk related to various surgery and radiation options; HEP    Person(s) Educated Patient    Methods Explanation;Demonstration;Handout    Comprehension Returned demonstration;Verbalized understanding                 PT Long Term Goals - 06/23/21 1325       PT LONG TERM GOAL #1   Title Patient will demonstrate she has regained full shoulder ROM and function post operatively compared to bsaelines.    Time 4    Period Weeks    Status On-going    Target Date 07/21/21      PT LONG TERM GOAL #2   Title Patient will increase right shoulder active flexion to >/= 140 degrees for increased ease reaching overhead.    Baseline 97 post op; 148 pre-op    Time 4    Period Weeks    Status New    Target Date 07/21/21      PT LONG TERM GOAL #3   Title Patient will increase right shoulder active abduction to >/= 150 degrees for increased ease obtaining radiation positioning.    Baseline 107 post op; 160 pre-op    Time 4    Period Weeks    Status New    Target Date 07/21/21      PT LONG TERM GOAL #4   Title Patient will verbalize good understanding of  lymphedema risk reduction practices.    Time 4    Period Weeks    Status New    Target Date 07/21/21      PT LONG TERM GOAL #5   Title Patient will report >/= 50% improvement in right flank pain to tolerate daily activities with greater ease.    Time 4    Period Weeks    Status New    Target Date 07/21/21                   Plan - 06/23/21 1151     Clinical Impression Statement Patient is healing very well s/p right mastectomy and sentinel node biopsy (2/2 posititve nodes) on 06/01/2021. Her shoulder ROM and function is very limited with mild pain in her right flank. She is struggling with the decision to have chemotherapy versus an ALND. She understands if she has an  ALND and additional positive nodes are found, chemo would be recommended anyway but she may be able to avoid chemo if the ALND shows no additional positive nodes (per Dr. Virgie Dad note). I reached out to her surgeon (referring MD) as she reported she "feels lost" and does not know what decision to make. The office reported he would contact the pt. She will benefit from PT to regain shoulder ROM and function, address scar tissue when ready, and improve posture.    PT Frequency 2x / week    PT Duration 4 weeks    PT Treatment/Interventions ADLs/Self Care Home Management;Therapeutic exercise;Patient/family education;Manual techniques;Manual lymph drainage;Passive range of motion;Scar mobilization    PT Next Visit Plan Begin PROM and AAROM exercises for right shoulder    PT Home Exercise Plan Post op shoulder ROM HEP    Consulted and Agree with Plan of Care Patient             Patient will benefit from skilled therapeutic intervention in order to improve the following deficits and impairments:  Postural dysfunction, Decreased range of motion, Decreased knowledge of precautions, Impaired UE functional use, Pain, Increased fascial restricitons, Decreased scar mobility  Visit Diagnosis: Malignant neoplasm of  upper-outer quadrant of right breast in female, estrogen receptor positive ([place]) - Plan: PT plan of care cert/re-cert  Abnormal posture - Plan: PT plan of care cert/re-cert  Aftercare following surgery for neoplasm - Plan: PT plan of care cert/re-cert  Stiffness of right shoulder, not elsewhere classified - Plan: PT plan of care cert/re-cert     Problem List Patient Active Problem List   Diagnosis Date Noted   S/P mastectomy, right 06/01/2021   Genetic testing 05/11/2021   Family history of breast cancer 05/04/2021   Malignant neoplasm of upper-outer quadrant of right breast in female, estrogen receptor positive ([place]) 05/02/2021   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

## 2021-06-24 ENCOUNTER — Telehealth: Payer: Self-pay | Admitting: *Deleted

## 2021-06-24 ENCOUNTER — Other Ambulatory Visit: Payer: Self-pay | Admitting: Student

## 2021-06-24 NOTE — Telephone Encounter (Signed)
This RN scheduled pt for port placement on 06/27/2021 at [facility] interventional radiology.  Pt is to be at admitting at 8am for procedure to be done at 10 am.  She is to be NPO post midnight, will need a driver and someone to stay the night with her.  This RN called pt and verbalized above as well as pt read back information as above.

## 2021-06-27 ENCOUNTER — Encounter: Payer: Self-pay | Admitting: *Deleted

## 2021-06-27 ENCOUNTER — Other Ambulatory Visit: Payer: Self-pay | Admitting: Oncology

## 2021-06-27 ENCOUNTER — Encounter (HOSPITAL_COMMUNITY): Payer: Self-pay

## 2021-06-27 ENCOUNTER — Ambulatory Visit (HOSPITAL_COMMUNITY)
Admission: RE | Admit: 2021-06-27 | Discharge: 2021-06-27 | Disposition: A | Discharge status: Home / Self Care | Payer: BC Managed Care – PPO | Source: Ambulatory Visit | Attending: Oncology | Admitting: Oncology

## 2021-06-27 ENCOUNTER — Other Ambulatory Visit: Payer: Self-pay

## 2021-06-27 DIAGNOSIS — F32A Depression, unspecified: Secondary | ICD-10-CM | POA: Insufficient documentation

## 2021-06-27 DIAGNOSIS — Z79890 Hormone replacement therapy (postmenopausal): Secondary | ICD-10-CM | POA: Diagnosis not present

## 2021-06-27 DIAGNOSIS — Z79899 Other long term (current) drug therapy: Secondary | ICD-10-CM | POA: Insufficient documentation

## 2021-06-27 DIAGNOSIS — Z9011 Acquired absence of right breast and nipple: Secondary | ICD-10-CM

## 2021-06-27 DIAGNOSIS — Z880 Allergy status to penicillin: Secondary | ICD-10-CM | POA: Diagnosis not present

## 2021-06-27 DIAGNOSIS — Z170 Estrogen receptor positive status [ER+]: Secondary | ICD-10-CM

## 2021-06-27 DIAGNOSIS — C50912 Malignant neoplasm of unspecified site of left female breast: Secondary | ICD-10-CM | POA: Diagnosis present

## 2021-06-27 DIAGNOSIS — C50411 Malignant neoplasm of upper-outer quadrant of right female breast: Secondary | ICD-10-CM

## 2021-06-27 HISTORY — PX: IR IMAGING GUIDED PORT INSERTION: IMG5740

## 2021-06-27 LAB — PREGNANCY, URINE: Preg Test, Ur: NEGATIVE

## 2021-06-27 MED ORDER — MIDAZOLAM HCL 2 MG/2ML IJ SOLN
INTRAMUSCULAR | Status: DC | PRN
  Administered 2021-06-27: 1 mg via INTRAVENOUS
  Administered 2021-06-27: .5 mg via INTRAVENOUS

## 2021-06-27 MED ORDER — SODIUM CHLORIDE 0.9 % IV SOLN: INTRAVENOUS | Status: DC

## 2021-06-27 MED ORDER — FENTANYL CITRATE (PF) 100 MCG/2ML IJ SOLN
INTRAMUSCULAR | Status: DC | PRN
  Administered 2021-06-27 (×2): 25 ug via INTRAVENOUS

## 2021-06-27 MED ORDER — MIDAZOLAM HCL 2 MG/2ML IJ SOLN
INTRAMUSCULAR | Status: AC
  Filled 2021-06-27: qty 2

## 2021-06-27 MED ORDER — FENTANYL CITRATE (PF) 100 MCG/2ML IJ SOLN
INTRAMUSCULAR | Status: AC
  Filled 2021-06-27: qty 2

## 2021-06-27 MED ORDER — LIDOCAINE-EPINEPHRINE 1 %-1:100000 IJ SOLN
INTRAMUSCULAR | Status: AC
  Administered 2021-06-27: 10 mL
  Filled 2021-06-27: qty 1

## 2021-06-27 MED ORDER — HEPARIN SOD (PORK) LOCK FLUSH 100 UNIT/ML IV SOLN
INTRAVENOUS | Status: AC
  Administered 2021-06-27: 500 [IU]
  Filled 2021-06-27: qty 5

## 2021-06-27 MED ORDER — LIDOCAINE HCL 1 % IJ SOLN
INTRAMUSCULAR | Status: DC | PRN
  Administered 2021-06-27: 20 mL via INTRADERMAL

## 2021-06-27 NOTE — Progress Notes (Addendum)
Discharge instructions reviewed with pt  and her husband voices understanding.

## 2021-06-27 NOTE — Procedures (Signed)
Interventional Radiology Procedure Note  Procedure: Single Lumen Power Port Placement    Access:  Left internal jugular vein  Findings: Catheter tip positioned at cavoatrial junction. Port is ready for immediate use.   Complications: None  EBL: < 10 mL  Recommendations:  - Ok to shower in 24 hours - Do not submerge for 7 days - Routine line care    Graylee Arutyunyan, MD   

## 2021-06-27 NOTE — H&P (Signed)
Chief Complaint: Patient was seen in consultation today for breast cancer  Referring Physician(s): Chauncey Cruel  Supervising Physician: Ruthann Cancer  Patient Status: [facility] - Out-pt  History of Present Illness: Sophia Moore is a 40 y.o. female with past medical history of depression, thyroid nodule s/p thyroidectomy with recent diagnosis of right breast cancer who presents to [facility] Radiology today in need of durable venous access for chemotherapy.  IR consulted for Port-A-Cath placement.   Patient presents to [facility] Radiology today in her usual state of health.  She has been NPO. She is aware of the goals of the procedure today and is agreeable to proceed. Her husband is available today for transportation and recovery care at home post-procedure.   Past Medical History:  Diagnosis Date   Depression    takes Citalopram daily   Family history of breast cancer    Thyroid nodule 07/2016    Past Surgical History:  Procedure Laterality Date   MASTECTOMY W/ SENTINEL NODE BIOPSY Right 06/01/2021   Procedure: RIGHT MASTECTOMY WITH AXILLARY SENTINEL LYMPH NODE BIOPSY;  Surgeon: Rol[place] Bookbinder, MD;  Location: [facility];  Service: General;  Laterality: Right;   THYROIDECTOMY  09/13/2016   THYROIDECTOMY N/A 09/13/2016   Procedure: TOTAL THYROIDECTOMY;  Surgeon: Leta Baptist, MD;  Location: [facility] OR;  Service: ENT;  Laterality: N/A;   wisdom teeth extracted      WISDOM TOOTH EXTRACTION      Allergies: Amoxicillin and Penicillins  Medications: Prior to Admission medications   Medication Sig Start Date End Date Taking? Authorizing Provider  levothyroxine (SYNTHROID) 88 MCG tablet TAKE 1 TABLET BY MOUTH EVERY DAY BEFORE BREAKFAST 03/16/21  Yes Nida, Marella Chimes, MD  dexamethasone (DECADRON) 4 MG tablet Take 2 tablets (8 mg total) by mouth 2 (two) times daily. Start the day before Taxote[place]. Then again the day after chemo for 3 days. 06/23/21   Magrinat, Virgie Dad,  MD  lidocaine-prilocaine (EMLA) cream Apply to affected area once 06/23/21   Magrinat, Virgie Dad, MD  methocarbamol (ROBAXIN) 750 MG tablet Take 1 tablet (750 mg total) by mouth 4 (four) times daily as needed (use for muscle cramps/pain). 06/01/21   Rolm Bookbinder, MD  oxyCODONE (OXY IR/ROXICODONE) 5 MG immediate release tablet Take 1 tablet (5 mg total) by mouth every 6 (six) hours as needed. 06/01/21   Rolm Bookbinder, MD  prochlorperazine (COMPAZINE) 10 MG tablet Take 1 tablet (10 mg total) by mouth every 6 (six) hours as needed (Nausea or vomiting). 06/23/21   Magrinat, Virgie Dad, MD     Family History  Problem Relation Age of Onset   Hyperlipidemia Mother    Stroke Mother    Breast cancer Maternal Aunt        dx 19s    Social History   Socioeconomic History   Marital status: Married    Spouse name: Not on file   Number of children: Not on file   Years of education: Not on file   Highest education level: Not on file  Occupational History   Not on file  Tobacco Use   Smoking status: Never   Smokeless tobacco: Never  Vaping Use   Vaping Use: Never used  Substance and Sexual Activity   Alcohol use: No   Drug use: No   Sexual activity: Not Currently    Birth control/protection: None  Other Topics Concern   Not on file  Social History Narrative   Not on file   Social Determinants of  Health   Financial Resource Strain: Low Risk    Difficulty of Paying Living Expenses: Not very hard  Food Insecurity: Not on file  Transportation Needs: No Transportation Needs   Lack of Transportation (Medical): No   Lack of Transportation (Non-Medical): No  Physical Activity: Not on file  Stress: Not on file  Social Connections: Not on file     Review of Systems: A 12 point ROS discussed and pertinent positives are indicated in the HPI above.  All other systems are negative.  Review of Systems  Constitutional:  Negative for fatigue and fever.  Respiratory:  Negative for cough  and shortness of breath.   Cardiovascular:  Negative for chest pain.  Gastrointestinal:  Negative for abdominal pain, diarrhea, nausea and vomiting.  Musculoskeletal:  Negative for back pain.  Skin:  Negative for pallor.  Psychiatric/Behavioral:  Negative for behavioral problems and confusion.    Vital Signs: BP 122/78   Pulse 65   Temp 98.3 F (36.8 C) (Oral)   Resp 15   Ht '5\' 1"'$  (1.549 m)   Wt 117 lb (53.1 kg)   LMP 06/02/2021 (Exact Date)   SpO2 99%   BMI 22.11 kg/m   Physical Exam Vitals and nursing note reviewed.  Constitutional:      General: She is not in acute distress.    Appearance: Normal appearance. She is not ill-appearing.  Cardiovascular:     Rate and Rhythm: Normal rate and regular rhythm.  Pulmonary:     Effort: Pulmonary effort is normal. No respiratory distress.     Breath sounds: Normal breath sounds.  Musculoskeletal:     Cervical back: Normal range of motion and neck supple.  Skin:    General: Skin is warm and dry.  Neurological:     General: No focal deficit present.     Mental Status: She is alert and oriented to person, place, and time. Mental status is at baseline.  Psychiatric:        Mood and Affect: Mood normal.        Behavior: Behavior normal.        Thought Content: Thought content normal.        Judgment: Judgment normal.     MD Evaluation Airway: WNL Heart: WNL Abdomen: WNL Chest/ Lungs: WNL ASA  Classification: 3 Mallampati/Airway Score: Two   Imaging: NM Sentinel Node Inj-No Rpt (Breast)  Result Date: 06/01/2021 Sulfur Colloid was injected by the Nuclear Medicine Technologist for sentinel lymph node localization.    Labs:  CBC: Recent Labs    05/04/21 0832  WBC 4.5  HGB 14.9  HCT 42.2  PLT 258    COAGS: No results for input(s): INR, APTT in the last 8760 hours.  BMP: Recent Labs    05/04/21 0832  NA 141  K 3.9  CL 107  CO2 27  GLUCOSE 86  BUN 13  CALCIUM 9.1  CREATININE 0.77  GFRNONAA >60     LIVER FUNCTION TESTS: Recent Labs    05/04/21 0832  BILITOT 0.7  AST 16  ALT 7  ALKPHOS 53  PROT 6.8  ALBUMIN 3.6    TUMOR MARKERS: No results for input(s): AFPTM, CEA, CA199, CHROMGRNA in the last 8760 hours.  Assessment and Plan: Patient with past medical history of depression, thyroid nodule presents with complaint of recent diagnosis of breast cancer with plans to initiate chemotherapy.  IR consulted for Port-A-Cath placement at the request of Dr. Jana Hakim. Case reviewed by Dr. Serafina Royals who approves  patient for procedure.  Patient presents today in their usual state of health.  She has been NPO and is not currently on blood thinners. She is aware Port will be placed on the left due to the location of her cancer.   Risks and benefits of image guided port-a-catheter placement was discussed with the patient including, but not limited to bleeding, infection, pneumothorax, or fibrin sheath development and need for additional procedures.  All of the patient's questions were answered, patient is agreeable to proceed. Consent signed and in chart.  Thank you for this interesting consult.  I greatly enjoyed meeting Brei Vargas-Turrubiartes and look forward to participating in their care.  A copy of this report was sent to the requesting provider on this date.  Electronically Signed: Docia Barrier, PA 06/27/2021, 9:45 AM   I spent a total of  30 Minutes   in face to face in clinical consultation, greater than 50% of which was counseling/coordinating care for breast cancer.

## 2021-06-29 ENCOUNTER — Inpatient Hospital Stay: Payer: BC Managed Care – PPO

## 2021-06-29 ENCOUNTER — Other Ambulatory Visit: Payer: Self-pay

## 2021-06-29 MED FILL — Dexamethasone Sodium Phosphate Inj 100 MG/10ML: INTRAMUSCULAR | Qty: 1 | Status: AC

## 2021-06-30 ENCOUNTER — Encounter: Payer: Self-pay | Admitting: Adult Health

## 2021-06-30 ENCOUNTER — Encounter: Payer: Self-pay | Admitting: Oncology

## 2021-06-30 ENCOUNTER — Telehealth: Payer: BC Managed Care – PPO | Admitting: Oncology

## 2021-06-30 ENCOUNTER — Inpatient Hospital Stay: Payer: BC Managed Care – PPO

## 2021-06-30 ENCOUNTER — Ambulatory Visit: Payer: BC Managed Care – PPO | Admitting: Oncology

## 2021-06-30 ENCOUNTER — Other Ambulatory Visit: Payer: Self-pay | Admitting: Oncology

## 2021-06-30 ENCOUNTER — Encounter: Payer: Self-pay | Admitting: *Deleted

## 2021-06-30 ENCOUNTER — Other Ambulatory Visit: Payer: BC Managed Care – PPO

## 2021-06-30 ENCOUNTER — Inpatient Hospital Stay (HOSPITAL_BASED_OUTPATIENT_CLINIC_OR_DEPARTMENT_OTHER): Payer: BC Managed Care – PPO | Admitting: Adult Health

## 2021-06-30 VITALS — BP 117/72 | HR 70 | Temp 97.7°F | Resp 18 | Ht 61.0 in | Wt 114.8 lb

## 2021-06-30 VITALS — BP 112/78 | HR 59 | Temp 98.6°F | Resp 18

## 2021-06-30 DIAGNOSIS — Z95828 Presence of other vascular implants and grafts: Secondary | ICD-10-CM

## 2021-06-30 DIAGNOSIS — Z170 Estrogen receptor positive status [ER+]: Secondary | ICD-10-CM

## 2021-06-30 DIAGNOSIS — C50411 Malignant neoplasm of upper-outer quadrant of right female breast: Secondary | ICD-10-CM

## 2021-06-30 LAB — COMPREHENSIVE METABOLIC PANEL
ALT: 7 U/L (ref 0–44)
AST: 14 U/L — ABNORMAL LOW (ref 15–41)
Albumin: 3.8 g/dL (ref 3.5–5.0)
Alkaline Phosphatase: 47 U/L (ref 38–126)
Anion gap: 10 (ref 5–15)
BUN: 15 mg/dL (ref 6–20)
CO2: 22 mmol/L (ref 22–32)
Calcium: 9.3 mg/dL (ref 8.9–10.3)
Chloride: 108 mmol/L (ref 98–111)
Creatinine, Ser: 0.75 mg/dL (ref 0.44–1.00)
GFR, Estimated: >60 mL/min (ref >60)
Glucose, Bld: 169 mg/dL — ABNORMAL HIGH (ref 70–99)
Potassium: 3.7 mmol/L (ref 3.5–5.1)
Sodium: 140 mmol/L (ref 135–145)
Total Bilirubin: 0.5 mg/dL (ref 0.3–1.2)
Total Protein: 7 g/dL (ref 6.5–8.1)

## 2021-06-30 LAB — CBC WITH DIFFERENTIAL/PLATELET
Abs Immature Granulocytes: 0.01 10*3/uL (ref 0.00–0.07)
Basophils Absolute: 0 10*3/uL (ref 0.0–0.1)
Basophils Relative: 0 %
Eosinophils Absolute: 0 10*3/uL (ref 0.0–0.5)
Eosinophils Relative: 0 %
HCT: 40.5 % (ref 36.0–46.0)
Hemoglobin: 14 g/dL (ref 12.0–15.0)
Immature Granulocytes: 0 %
Lymphocytes Relative: 22 %
Lymphs Abs: 1.4 10*3/uL (ref 0.7–4.0)
MCH: 29.5 pg (ref 26.0–34.0)
MCHC: 34.6 g/dL (ref 30.0–36.0)
MCV: 85.4 fL (ref 80.0–100.0)
Monocytes Absolute: 0.4 10*3/uL (ref 0.1–1.0)
Monocytes Relative: 6 %
Neutro Abs: 4.6 10*3/uL (ref 1.7–7.7)
Neutrophils Relative %: 72 %
Platelets: 234 10*3/uL (ref 150–400)
RBC: 4.74 MIL/uL (ref 3.87–5.11)
RDW: 12 % (ref 11.5–15.5)
WBC: 6.4 10*3/uL (ref 4.0–10.5)
nRBC: 0 % (ref 0.0–0.2)

## 2021-06-30 MED ORDER — SODIUM CHLORIDE 0.9 % IV SOLN
600.0000 mg/m2 | Freq: Once | INTRAVENOUS | Status: AC
  Administered 2021-06-30: 920 mg via INTRAVENOUS
  Filled 2021-06-30: qty 46

## 2021-06-30 MED ORDER — SODIUM CHLORIDE 0.9 % IV SOLN
75.0000 mg/m2 | Freq: Once | INTRAVENOUS | Status: AC
  Administered 2021-06-30: 110 mg via INTRAVENOUS
  Filled 2021-06-30: qty 11

## 2021-06-30 MED ORDER — HEPARIN SOD (PORK) LOCK FLUSH 100 UNIT/ML IV SOLN
500.0000 [IU] | Freq: Once | INTRAVENOUS | Status: AC | PRN
  Administered 2021-06-30: 500 [IU]

## 2021-06-30 MED ORDER — SODIUM CHLORIDE 0.9% FLUSH
10.0000 mL | INTRAVENOUS | Status: DC | PRN
  Administered 2021-06-30: 10 mL via INTRAVENOUS

## 2021-06-30 MED ORDER — PALONOSETRON HCL INJECTION 0.25 MG/5ML
0.2500 mg | Freq: Once | INTRAVENOUS | Status: AC
  Administered 2021-06-30: 0.25 mg via INTRAVENOUS
  Filled 2021-06-30: qty 5

## 2021-06-30 MED ORDER — SODIUM CHLORIDE 0.9 % IV SOLN: Freq: Once | INTRAVENOUS | Status: AC

## 2021-06-30 MED ORDER — SODIUM CHLORIDE 0.9% FLUSH
10.0000 mL | INTRAVENOUS | Status: DC | PRN
  Administered 2021-06-30: 10 mL

## 2021-06-30 MED ORDER — SODIUM CHLORIDE 0.9 % IV SOLN
10.0000 mg | Freq: Once | INTRAVENOUS | Status: AC
  Administered 2021-06-30: 10 mg via INTRAVENOUS
  Filled 2021-06-30: qty 10

## 2021-06-30 NOTE — Progress Notes (Signed)
[place]  [phone] [phone]     ID: Sophia Moore DOB: 1980/12/07  MR#: [id number]  KKX#:[id number]  Patient Care Team: [facility] as PCP - General (Family Medicine) Mauro Kaufmann, RN as Oncology Nurse Navigator Rockwell Germany, RN as Oncology Nurse Navigator Rolm Bookbinder, MD as Consulting Physician (General Surgery) Magrinat, Virgie Dad, MD as Consulting Physician (Oncology) Eppie Gibson, MD as Attending Physician (Radiation Oncology) Scot Dock, NP OTHER MD:  CHIEF COMPLAINT: Estrogen receptor positive breast cancer  CURRENT TREATMENT: adjvuant chemotherapy    INTERVAL HISTORY: Amyra returns today for follow up of her estrogen receptor positive breast cancer.   She is due to start chemotherapy today with Docetaxel and Cyclophosphamide every 21 days x 4 cycles.  She has all of her medications for nausea, and took the dexamethasone yesterday.  She received her prochlorperazine, but mistakenly picked up benadryl instead of Claritin.  She also received Requip from her pharmacy with a different patient's name and address on it.  She says that she is not prescribed requip and she doesn't know what this is for.    She met with our chemo teacher yesterday who she says explained everything well.  She underwent port placement this past Monday which was painful, however today it is feeling improved.  Her mastectomy site is healing quite well.   REVIEW OF SYSTEMS: Review of Systems  Constitutional:  Negative for appetite change, chills, fatigue, fever and unexpected weight change.  HENT:   Negative for hearing loss, lump/mass and trouble swallowing.   Eyes:  Negative for eye problems and icterus.  Respiratory:  Negative for chest tightness, cough and shortness of breath.   Cardiovascular:  Negative for chest pain, leg swelling and palpitations.  Gastrointestinal:  Negative for abdominal  distention, abdominal pain, constipation, diarrhea, nausea and vomiting.  Endocrine: Negative for hot flashes.  Genitourinary:  Negative for difficulty urinating.   Musculoskeletal:  Negative for arthralgias.  Skin:  Negative for itching and rash.  Neurological:  Negative for dizziness, extremity weakness, headaches and numbness.  Hematological:  Negative for adenopathy. Does not bruise/bleed easily.  Psychiatric/Behavioral:  Negative for depression. The patient is not nervous/anxious.      COVID 19 VACCINATION STATUS: not vaccinated, infection 05/2019 (results in [place])   HISTORY OF CURRENT ILLNESS: From the original intake note:  Sophia Moore had routine screening mammography (her first ever) on 03/22/2021 showing a possible abnormality in the right breast. She underwent right diagnostic mammography with tomography and right breast ultrasonography at [facility] on 04/19/2021 showing: breast density category C; palpable 6.3 cm mass involving the central and upper half of the right breast; 2-4 satellite nodules within upper-outer quadrant; no right axillary adenopathy.  Accordingly on 04/26/2021 she proceeded to biopsy of the right breast area in question. 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 Proliferation marker Ki67 at 5%. HER2 equivocal by immunohistochemistry (2+), but negative by fluorescent in situ hybridization with a signals ratio 1.2 and number per cell 1.8. 2. Right Breast, upper-outer axillary tail  - flat epithelial atypia with calcifications  Cancer Staging Malignant neoplasm of upper-outer quadrant of right breast in female, estrogen receptor positive  ([place]) Staging form: Breast, AJCC 8th Edition - Clinical  stage from 05/04/2021: Stage IIA (cT3, cN0, cM0, G2, ER+, PR+, HER2-) - Signed by Chauncey Cruel, MD on 05/04/2021 Stage prefix: Initial diagnosis Histologic grading system: 3 grade system Laterality: Right Staged by: Pathologist and managing physician Stage used in treatment planning: Yes National guidelines used in treatment planning: Yes Type of national guideline used in treatment planning: NCCN  The patient's subsequent history is as detailed below.   PAST MEDICAL HISTORY: Past Medical History:  Diagnosis Date   Depression    takes Citalopram daily   Family history of breast cancer    Thyroid nodule 07/2016    PAST SURGICAL HISTORY: Past Surgical History:  Procedure Laterality Date   IR IMAGING GUIDED PORT INSERTION  06/27/2021   MASTECTOMY W/ SENTINEL NODE BIOPSY Right 06/01/2021   Procedure: RIGHT MASTECTOMY WITH AXILLARY SENTINEL LYMPH NODE BIOPSY;  Surgeon: Rolm Bookbinder, MD;  Location: [facility];  Service: General;  Laterality: Right;   THYROIDECTOMY  09/13/2016   THYROIDECTOMY N/A 09/13/2016   Procedure: TOTAL THYROIDECTOMY;  Surgeon: Leta Baptist, MD;  Location: [facility];  Service: ENT;  Laterality: N/A;   wisdom teeth extracted      WISDOM TOOTH EXTRACTION      FAMILY HISTORY: Family History  Problem Relation Age of Onset   Hyperlipidemia Mother    Stroke Mother    Breast cancer Maternal Aunt        dx 81s   Her parents are both living, her father age 51 and her mother age 62, as of 04/2021. Sophia Moore has two brothers and three sisters. She reports breast cancer in a maternal aunt (age 3+).   GYNECOLOGIC HISTORY:  Patient's last menstrual period was 06/02/2021 (exact date). Menarche: unsure Age at first live birth: 40 years old [place] P 3 LMP 04/13/2021 Contraceptive never used HRT n/a  Hysterectomy? no BSO? 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 She is on break for the summer.  She is originally from the [place] section of [place].  Husband Efren work in Architect. She lives at home with daughter [name], age 74, and son [name], age 79. Her oldest son [name], age 65, works at [employer]. She attends a [employer].    ADVANCED DIRECTIVES: In the absence of any documentation to the contrary, the patient's spouse is their HCPOA.    HEALTH MAINTENANCE: Social History   Tobacco Use   Smoking status: Never   Smokeless tobacco: Never  Vaping Use   Vaping Use: Never used  Substance Use Topics   Alcohol use: No   Drug use: No     Colonoscopy: n/a (age)  PAP: 03/2021  Bone density: n/a (age)   Allergies  Allergen Reactions   Amoxicillin Rash   Penicillins Rash    Has patient had a PCN reaction causing immediate rash, facial/tongue/throat swelling, SOB or lightheadedness with hypotension: Yes Has patient had a PCN reaction causing severe rash involving mucus membranes or skin necrosis: No Has patient had a PCN reaction that required hospitalization No Has patient had a PCN reaction occurring within the last 10 years: Yes If all of the above answers are "NO", then may proceed with Cephalosporin use.     Current Outpatient Medications  Medication Sig Dispense Refill   dexamethasone (DECADRON) 4 MG tablet Take 2 tablets (8 mg total) by mouth 2 (two) times daily. Start the day before Taxotere. Then again the day after  chemo for 3 days. 30 tablet 1   levothyroxine (SYNTHROID) 88 MCG tablet TAKE 1 TABLET BY MOUTH EVERY DAY BEFORE BREAKFAST 90 tablet 1   lidocaine-prilocaine (EMLA) cream Apply to affected area once 30 g 3   methocarbamol (ROBAXIN) 750 MG tablet Take 1 tablet (750 mg total) by mouth 4 (four) times daily as needed (use for muscle cramps/pain). 30 tablet 0   oxyCODONE (OXY IR/ROXICODONE) 5 MG immediate release tablet Take 1 tablet (5 mg total) by mouth every 6 (six) hours  as needed. 10 tablet 0   prochlorperazine (COMPAZINE) 10 MG tablet Take 1 tablet (10 mg total) by mouth every 6 (six) hours as needed (Nausea or vomiting). 30 tablet 1   No current facility-administered medications for this visit.    OBJECTIVE: Spanish speaker who appears stated age  40:   06/30/21 1253  BP: 117/72  Pulse: 70  Resp: 18  Temp: 97.7 F (36.5 C)  SpO2: 100%     Body mass index is 21.69 kg/m.   Wt Readings from Last 3 Encounters:  06/30/21 114 lb 12.8 oz (52.1 kg)  06/27/21 117 lb (53.1 kg)  06/20/21 117 lb 3.2 oz (53.2 kg)      ECOG FS:1 - Symptomatic but completely ambulatory GENERAL: Patient is a well appearing female in no acute distress HEENT:  Sclerae anicteric.  Oropharynx clear and moist. No ulcerations or evidence of oropharyngeal candidiasis. Neck is supple.  NODES:  No cervical, supraclavicular, or axillary lymphadenopathy palpated.  BREAST EXAM:  right breast s/p mastectomy, covered with steri strips, healing quite well, no sign of infection or any area of concern. LUNGS:  Clear to auscultation bilaterally.  No wheezes or rhonchi. HEART:  Regular rate and rhythm. No murmur appreciated. ABDOMEN:  Soft, nontender.  Positive, normoactive bowel sounds. No organomegaly palpated. MSK:  No focal spinal tenderness to palpation. Full range of motion bilaterally in the upper extremities. EXTREMITIES:  No peripheral edema.   SKIN:  Clear with no obvious rashes or skin changes. No nail dyscrasia. NEURO:  Nonfocal. Well oriented.  Appropriate affect.    LAB RESULTS:  CMP     Component Value Date/Time   NA 141 05/04/2021 0832   K 3.9 05/04/2021 0832   CL 107 05/04/2021 0832   CO2 27 05/04/2021 0832   GLUCOSE 86 05/04/2021 0832   BUN 13 05/04/2021 0832   CREATININE 0.77 05/04/2021 0832   CALCIUM 9.1 05/04/2021 0832   PROT 6.8 05/04/2021 0832   ALBUMIN 3.6 05/04/2021 0832   AST 16 05/04/2021 0832   ALT 7 05/04/2021 0832   ALKPHOS 53 05/04/2021 0832    BILITOT 0.7 05/04/2021 0832   GFRNONAA >60 05/04/2021 0832    No results found for: TOTALPROTELP, ALBUMINELP, A1GS, A2GS, BETS, BETA2SER, GAMS, MSPIKE, SPEI  Lab Results  Component Value Date   WBC 6.4 06/30/2021   NEUTROABS 4.6 06/30/2021   HGB 14.0 06/30/2021   HCT 40.5 06/30/2021   MCV 85.4 06/30/2021   PLT 234 06/30/2021    No results found for: LABCA2  No components found for: [id number]  No results for input(s): INR in the last 168 hours.  No results found for: LABCA2  No results found for: WUJ811  No results found for: BJY782  No results found for: NFA213  No results found for: CA2729  No components found for: HGQUANT  No results found for: CEA1 / No results found for: CEA1   No results found for: AFPTUMOR  No results found  for: CHROMOGRNA  No results found for: KPAFRELGTCHN, LAMBDASER, KAPLAMBRATIO (kappa/lambda light chains)  No results found for: HGBA, HGBA2QUANT, HGBFQUANT, HGBSQUAN (Hemoglobinopathy evaluation)   No results found for: LDH  No results found for: IRON, TIBC, IRONPCTSAT (Iron and TIBC)  No results found for: FERRITIN  Urinalysis No results found for: COLORURINE, APPEARANCEUR, LABSPEC, PHURINE, GLUCOSEU, HGBUR, BILIRUBINUR, KETONESUR, PROTEINUR, UROBILINOGEN, NITRITE, LEUKOCYTESUR   STUDIES: NM Sentinel Node Inj-No Rpt (Breast)  Result Date: 06/01/2021 Sulfur Colloid was injected by the Nuclear Medicine Technologist for sentinel lymph node localization.   IR IMAGING GUIDED PORT INSERTION  Result Date: 06/27/2021 INDICATION: 40 year old female with right breast cancer requiring central venous access for chemotherapy. EXAM: IMPLANTED PORT A CATH PLACEMENT WITH ULTRASOUND AND FLUOROSCOPIC GUIDANCE COMPARISON:  None. MEDICATIONS: None. ANESTHESIA/SEDATION: Moderate (conscious) sedation was employed during this procedure. A total of Versed 1.5 mg and Fentanyl 50 mcg was administered intravenously. Moderate Sedation Time: 24  minutes. The patient's level of consciousness and vital signs were monitored continuously by radiology nursing throughout the procedure under my direct supervision. CONTRAST:  None FLUOROSCOPY TIME:  0 minutes, 6 seconds (0.9 mGy) COMPLICATIONS: None immediate. PROCEDURE: The procedure, risks, benefits, and alternatives were explained to the patient. Questions regarding the procedure were encouraged and answered. The patient understands and consents to the procedure. The left neck and chest were prepped with chlorhexidine in a sterile fashion, and a sterile drape was applied covering the operative field. Maximum barrier sterile technique with sterile gowns and gloves were used for the procedure. A timeout was performed prior to the initiation of the procedure. Ultrasound was used to examine the jugular vein which was compressible and free of internal echoes. A skin marker was used to demarcate the planned venotomy and port pocket incision sites. Local anesthesia was provided to these sites and the subcutaneous tunnel track with 1% lidocaine with 1:100,000 epinephrine. A small incision was created at the jugular access site and blunt dissection was performed of the subcutaneous tissues. Under ultrasound guidance, the jugular vein was accessed with a 21 ga micropuncture needle and an 0.018" wire was inserted to the superior vena cava. Real-time ultrasound guidance was utilized for vascular access including the acquisition of a permanent ultrasound image documenting patency of the accessed vessel. A 5 Fr micopuncture set was then used, through which a 0.035" Rosen wire was passed under fluoroscopic guidance into the inferior vena cava. An 8 Fr dilator was then placed over the wire. A subcutaneous port pocket was then created along the upper chest wall utilizing a combination of sharp and blunt dissection. The pocket was irrigated with sterile saline, packed with gauze, and observed for hemorrhage. A single lumen "ISP"  sized power injectable port was chosen for placement. The 8 Fr catheter was tunneled from the port pocket site to the venotomy incision. The port was placed in the pocket. The external catheter was trimmed to appropriate length. The dilator was exchanged for an 8 Fr peel-away sheath under fluoroscopic guidance. The catheter was then placed through the sheath and the sheath was removed. Final catheter positioning was confirmed and documented with a fluoroscopic spot radiograph. The port was accessed with a Huber needle, aspirated, and flushed with heparinized saline. The deep dermal layer of the port pocket incision was closed with interrupted 3-0 Vicryl suture. The skin was opposed with a running subcuticular 4-0 Monocryl suture. Dermabond was then placed over the port pocket and neck incisions. The patient tolerated the procedure well without immediate post procedural complication.  FINDINGS: After catheter placement, the tip lies within the superior cavoatrial junction. The catheter aspirates and flushes normally and is ready for immediate use. IMPRESSION: Successful placement of a power injectable Port-A-Cath via the left internal jugular vein. The catheter is ready for immediate use. Ruthann Cancer, MD [facility] Radiology Electronically Signed   By: Ruthann Cancer M.D.   On: 06/27/2021 10:58     ELIGIBLE FOR AVAILABLE RESEARCH PROTOCOL: no  ASSESSMENT: 40 y.o. Spanish speaker status post right breast upper outer quadrant biopsy 04/26/2021 for a clinical mT3 N0, stage IIA invasive ductal carcinoma, grade 1 or 2, estrogen and progesterone receptor positive, HER2 not amplified, with an MIB-1 of 5%.  (1) genetics testing 05/16/2021 through the [employer] panel (report date 05/11/2021) or CancerNext-Expanded + RNAinsight panel (report date 05/16/2021).   The BRCAplus panel offered by [employer] and includes sequencing and deletion/duplication analysis for the  following 8 genes: ATM, BRCA1, BRCA2, CDH1, CHEK2, PALB2, PTEN, and TP53. The CancerNext-Expanded + RNAinsight gene panel offered by [employer] and includes sequencing and rearrangement analysis for the following 77 genes: AIP, ALK, APC, ATM, AXIN2, BAP1, BARD1, BLM, BMPR1A, BRCA1, BRCA2, BRIP1, CDC73, CDH1, CDK4, CDKN1B, CDKN2A, CHEK2, CTNNA1, DICER1, FANCC, FH, FLCN, GALNT12, KIF1B, LZTR1, MAX, MEN1, MET, MLH1, MSH2, MSH3, MSH6, MUTYH, NBN, NF1, NF2, NTHL1, PALB2, PHOX2B, PMS2, POT1, PRKAR1A, PTCH1, PTEN, RAD51C, RAD51D, RB1, RECQL, RET, SDHA, SDHAF2, SDHB, SDHC, SDHD, SMAD4, SMARCA4, SMARCB1, SMARCE1, STK11, SUFU, TMEM127, TP53, TSC1, TSC2, VHL and XRCC2 (sequencing and deletion/duplication); EGFR, EGLN1, HOXB13, KIT, MITF, PDGFRA, POLD1 and POLE (sequencing only); EPCAM and GREM1 (deletion/duplication only). RNA data is routinely analyzed for use in variant interpretation for all genes.  (A) A variant of uncertain significance (VUS) was detected in the [facility] gene called p.R1369H (c.4106G>A).  (2) MammaPrint obtained from the 04/26/2021 biopsy returned luminal a type, low risk, with a predicted benefit from additional chemotherapy in the 1-2% range [given the patient's young age however, the benefit of chemotherapy might be as high as 5%].  (3) status post right mastectomy and sentinel lymph node sampling 06/01/2021 for an mpT3 pN1-2, stage IIA invasive ductal carcinoma, grade 1, with negative margins  (A) a total of 2 right axillary lymph nodes were removed, both positive, 1 with extracapsular extension  (B) completion axillary dissection being considered  (3) adjuvant chemotherapy with docetaxel, cyclophosphamide given on day 1 of a 21 day cycle x 4 from 06/30/2021 through 09/01/2021  (4) adjuvant radiation  (5) tamoxifen started 05/04/2021 in anticipation of surgical delays   PLAN:  Lynnet is here today for evaluation prior to her Docetaxel and Cyclophsophamide.  I reviewed this regimen  with her in detail, and she understands the risks and benefits, and agrees to proceed.  In particular we reviewed hair loss (rarely irreversible), neuropathy, skin changes to hands and feet, nasuea, vomiting, mucositis.  I gave her Aveda lotion for her hands and feet to use during chemotherpay.    [place] and I reviewed her labs and they are normal.  She is healing well from her mastecotmy and port placement.  I reviewed all of her medications with her in detail.  It appears she picked up Requip which she says was not prescribed to her.  I instructed her to return it to the pharmacy and snapped a photo of it in the media section.  She has not taken this, and I told her not to take it.  We reviewed her anti nausea medications  in detail and she understands how to take her meds.  She needs to pick up claritin and I wrote this down for her.  She will return on Saturday for Udenyca and in one week for labs and evaluation.  She knows to call for any questions that may arise between now and her next appointment.  We are happy to see her sooner if needed.  Total encounter time: 30 minutes in chart review, lab review, order entry, care coordination, and documentation of the encounter.   Wilber Bihari, NP 06/30/21 1:03 PM Medical Oncology and Hematology [facility] [place],  [address] Tel. [phone]    Fax. [phone]    *Total Encounter Time as defined by the Centers for Medicare and Medicaid Services includes, in addition to the face-to-face time of a patient visit (documented in the note above) non-face-to-face time: obtaining and reviewing outside history, ordering and reviewing medications, tests or procedures, care coordination (communications with other health care professionals or caregivers) and documentation in the medical record.

## 2021-06-30 NOTE — Progress Notes (Signed)
Called pt to introduce myself as her Arboriculturist, discuss copay assistance and the [employer].  Pt gave me consent to apply in her behalf so I completed the online app for Udenyca.  I will notify the pt of the outcome once I receive it.  Pt would also like to apply for the [employer] and since she's not working right now she will be approved for the $1000 [employer].  I will give her an expense sheet and my card for any questions or concerns she may have in the future.  She inquired about more assistance programs so I will give her the social workers contact information to assist with that.

## 2021-06-30 NOTE — Patient Instructions (Signed)
[facility]   Discharge Instructions: Thank you for choosing [facility] to provide your oncology and hematology care.   If you have a lab appointment with the [facility], please go directly to the [facility] and check in at the registration area.   Wear comfortable clothing and clothing appropriate for easy access to any Portacath or PICC line.   We strive to give you quality time with your provider. You may need to reschedule your appointment if you arrive late (15 or more minutes).  Arriving late affects you and other patients whose appointments are after yours.  Also, if you miss three or more appointments without notifying the office, you may be dismissed from the clinic at the provider's discretion.      For prescription refill requests, have your pharmacy contact our office and allow 72 hours for refills to be completed.    Today you received the following chemotherapy and/or immunotherapy agents: Docetaxel (Taxotere) and Cyclophosphamide (Cytoxan).      To help prevent nausea and vomiting after your treatment, we encourage you to take your nausea medication as directed.  BELOW ARE SYMPTOMS THAT SHOULD BE REPORTED IMMEDIATELY: *FEVER GREATER THAN 100.4 F (38 C) OR HIGHER *CHILLS OR SWEATING *NAUSEA AND VOMITING THAT IS NOT CONTROLLED WITH YOUR NAUSEA MEDICATION *UNUSUAL SHORTNESS OF BREATH *UNUSUAL BRUISING OR BLEEDING *URINARY PROBLEMS (pain or burning when urinating, or frequent urination) *BOWEL PROBLEMS (unusual diarrhea, constipation, pain near the anus) TENDERNESS IN MOUTH AND THROAT WITH OR WITHOUT PRESENCE OF ULCERS (sore throat, sores in mouth, or a toothache) UNUSUAL RASH, SWELLING OR PAIN  UNUSUAL VAGINAL DISCHARGE OR ITCHING   Items with * indicate a potential emergency and should be followed up as soon as possible or go to the Emergency Department if any problems should occur.  Please show the CHEMOTHERAPY ALERT  CARD or IMMUNOTHERAPY ALERT CARD at check-in to the Emergency Department and triage nurse.  Should you have questions after your visit or need to cancel or reschedule your appointment, please contact [facility]  Dept: [phone]  and follow the prompts.  Office hours are 8:00 a.m. to 4:30 p.m. Monday - Friday. Please note that voicemails left after 4:00 p.m. may not be returned until the following business day.  We are closed weekends and major holidays. You have access to a nurse at all times for urgent questions. Please call the main number to the clinic Dept: [phone] and follow the prompts.   For any non-urgent questions, you may also contact your provider using MyChart. We now offer e-Visits for anyone 12 and older to request care online for non-urgent symptoms. For details visit [email or web address].   Also download the MyChart app! Go to the app store, search "MyChart", open the app, select [facility], and log in with your MyChart username and password.  Due to Covid, a mask is required upon entering the hospital/clinic. If you do not have a mask, one will be given to you upon arrival. For doctor visits, patients may have 1 support person aged 68 or older with them. For treatment visits, patients cannot have anyone with them due to current Covid guidelines and our immunocompromised population.    Cyclophosphamide Injection What is this medication? CYCLOPHOSPHAMIDE (sye kloe FOSS fa mide) is a chemotherapy drug. It slows the growth of cancer cells. This medicine is used to treat many types of cancer like lymphoma, myeloma, leukemia, breast cancer, and ovarian cancer, to  name a few. This medicine may be used for other purposes; ask your health care provider or pharmacist if you have questions. COMMON BRAND NAME(S): Cytoxan, Neosar What should I tell my care team before I take this medication? They need to know if you have any of these conditions: heart  disease history of irregular heartbeat infection kidney disease liver disease low blood counts, like white cells, platelets, or red blood cells on hemodialysis recent or ongoing radiation therapy scarring or thickening of the lungs trouble passing urine an unusual or allergic reaction to cyclophosphamide, other medicines, foods, dyes, or preservatives pregnant or trying to get pregnant breast-feeding How should I use this medication? This drug is usually given as an injection into a vein or muscle or by infusion into a vein. It is administered in a hospital or clinic by a specially trained health care professional. Talk to your pediatrician regarding the use of this medicine in children. Special care may be needed. Overdosage: If you think you have taken too much of this medicine contact a poison control center or emergency room at once. NOTE: This medicine is only for you. Do not share this medicine with others. What if I miss a dose? It is important not to miss your dose. Call your doctor or health care professional if you are unable to keep an appointment. What may interact with this medication? amphotericin B azathioprine certain antivirals for HIV or hepatitis certain medicines for blood pressure, heart disease, irregular heart beat certain medicines that treat or prevent blood clots like warfarin certain other medicines for cancer cyclosporine etanercept indomethacin medicines that relax muscles for surgery medicines to increase blood counts metronidazole This list may not describe all possible interactions. Give your health care provider a list of all the medicines, herbs, non-prescription drugs, or dietary supplements you use. Also tell them if you smoke, drink alcohol, or use illegal drugs. Some items may interact with your medicine. What should I watch for while using this medication? Your condition will be monitored carefully while you are receiving this medicine. You  may need blood work done while you are taking this medicine. Drink water or other fluids as directed. Urinate often, even at night. Some products may contain alcohol. Ask your health care professional if this medicine contains alcohol. Be sure to tell all health care professionals you are taking this medicine. Certain medicines, like metronidazole and disulfiram, can cause an unpleasant reaction when taken with alcohol. The reaction includes flushing, headache, nausea, vomiting, sweating, and increased thirst. The reaction can last from 30 minutes to several hours. Do not become pregnant while taking this medicine or for 1 year after stopping it. Women should inform their health care professional if they wish to become pregnant or think they might be pregnant. Men should not father a child while taking this medicine and for 4 months after stopping it. There is potential for serious side effects to an unborn child. Talk to your health care professional for more information. Do not breast-feed an infant while taking this medicine or for 1 week after stopping it. This medicine has caused ovarian failure in some women. This medicine may make it more difficult to get pregnant. Talk to your health care professional if you are concerned about your fertility. This medicine has caused decreased sperm counts in some men. This may make it more difficult to father a child. Talk to your health care professional if you are concerned about your fertility. Call your health care professional for  advice if you get a fever, chills, or sore throat, or other symptoms of a cold or flu. Do not treat yourself. This medicine decreases your body's ability to fight infections. Try to avoid being around people who are sick. Avoid taking medicines that contain aspirin, acetaminophen, ibuprofen, naproxen, or ketoprofen unless instructed by your health care professional. These medicines may hide a fever. Talk to your health care  professional about your risk of cancer. You may be more at risk for certain types of cancer if you take this medicine. If you are going to need surgery or other procedure, tell your health care professional that you are using this medicine. Be careful brushing or flossing your teeth or using a toothpick because you may get an infection or bleed more easily. If you have any dental work done, tell your dentist you are receiving this medicine. What side effects may I notice from receiving this medication? Side effects that you should report to your doctor or health care professional as soon as possible: allergic reactions like skin rash, itching or hives, swelling of the face, lips, or tongue breathing problems nausea, vomiting signs and symptoms of bleeding such as bloody or black, tarry stools; red or dark brown urine; spitting up blood or brown material that looks like coffee grounds; red spots on the skin; unusual bruising or bleeding from the eyes, gums, or nose signs and symptoms of heart failure like fast, irregular heartbeat, sudden weight gain; swelling of the ankles, feet, hands signs and symptoms of infection like fever; chills; cough; sore throat; pain or trouble passing urine signs and symptoms of kidney injury like trouble passing urine or change in the amount of urine signs and symptoms of liver injury like dark yellow or brown urine; general ill feeling or flu-like symptoms; light-colored stools; loss of appetite; nausea; right upper belly pain; unusually weak or tired; yellowing of the eyes or skin Side effects that usually do not require medical attention (report to your doctor or health care professional if they continue or are bothersome): confusion decreased hearing diarrhea facial flushing hair loss headache loss of appetite missed menstrual periods signs and symptoms of low red blood cells or anemia such as unusually weak or tired; feeling faint or lightheaded; falls skin  discoloration This list may not describe all possible side effects. Call your doctor for medical advice about side effects. You may report side effects to FDA at 1-800-FDA-1088. Where should I keep my medication? This drug is given in a hospital or clinic and will not be stored at home. NOTE: This sheet is a summary. It may not cover all possible information. If you have questions about this medicine, talk to your doctor, pharmacist, or health care provider.  2022 Elsevier/Gold Standard (2019-06-30 09:53:29)   Docetaxel injection What is this medication? DOCETAXEL (doe se TAX el) is a chemotherapy drug. It targets fast dividing cells, like cancer cells, and causes these cells to die. This medicine is used to treat many types of cancers like breast cancer, certain stomach cancers, head and neck cancer, lung cancer, and prostate cancer. This medicine may be used for other purposes; ask your health care provider or pharmacist if you have questions. COMMON BRAND NAME(S): Docefrez, Taxotere What should I tell my care team before I take this medication? They need to know if you have any of these conditions: infection (especially a virus infection such as chickenpox, cold sores, or herpes) liver disease low blood counts, like low white cell, platelet, or  red cell counts an unusual or allergic reaction to docetaxel, polysorbate 80, other chemotherapy agents, other medicines, foods, dyes, or preservatives pregnant or trying to get pregnant breast-feeding How should I use this medication? This drug is given as an infusion into a vein. It is administered in a hospital or clinic by a specially trained health care professional. Talk to your pediatrician regarding the use of this medicine in children. Special care may be needed. Overdosage: If you think you have taken too much of this medicine contact a poison control center or emergency room at once. NOTE: This medicine is only for you. Do not share  this medicine with others. What if I miss a dose? It is important not to miss your dose. Call your doctor or health care professional if you are unable to keep an appointment. What may interact with this medication? Do not take this medicine with any of the following medications: live virus vaccines This medicine may also interact with the following medications: aprepitant certain antibiotics like erythromycin or clarithromycin certain antivirals for HIV or hepatitis certain medicines for fungal infections like fluconazole, itraconazole, ketoconazole, posaconazole, or voriconazole cimetidine ciprofloxacin conivaptan cyclosporine dronedarone fluvoxamine grapefruit juice imatinib verapamil This list may not describe all possible interactions. Give your health care provider a list of all the medicines, herbs, non-prescription drugs, or dietary supplements you use. Also tell them if you smoke, drink alcohol, or use illegal drugs. Some items may interact with your medicine. What should I watch for while using this medication? Your condition will be monitored carefully while you are receiving this medicine. You will need important blood work done while you are taking this medicine. Call your doctor or health care professional for advice if you get a fever, chills or sore throat, or other symptoms of a cold or flu. Do not treat yourself. This drug decreases your body's ability to fight infections. Try to avoid being around people who are sick. Some products may contain alcohol. Ask your health care professional if this medicine contains alcohol. Be sure to tell all health care professionals you are taking this medicine. Certain medicines, like metronidazole and disulfiram, can cause an unpleasant reaction when taken with alcohol. The reaction includes flushing, headache, nausea, vomiting, sweating, and increased thirst. The reaction can last from 30 minutes to several hours. You may get drowsy or  dizzy. Do not drive, use machinery, or do anything that needs mental alertness until you know how this medicine affects you. Do not stand or sit up quickly, especially if you are an older patient. This reduces the risk of dizzy or fainting spells. Alcohol may interfere with the effect of this medicine. Talk to your health care professional about your risk of cancer. You may be more at risk for certain types of cancer if you take this medicine. Do not become pregnant while taking this medicine or for 6 months after stopping it. Women should inform their doctor if they wish to become pregnant or think they might be pregnant. There is a potential for serious side effects to an unborn child. Talk to your health care professional or pharmacist for more information. Do not breast-feed an infant while taking this medicine or for 1 week after stopping it. Males who get this medicine must use a condom during sex with females who can get pregnant. If you get a woman pregnant, the baby could have birth defects. The baby could die before they are born. You will need to continue wearing a condom  for 3 months after stopping the medicine. Tell your health care provider right away if your partner becomes pregnant while you are taking this medicine. This may interfere with the ability to father a child. You should talk to your doctor or health care professional if you are concerned about your fertility. What side effects may I notice from receiving this medication? Side effects that you should report to your doctor or health care professional as soon as possible: allergic reactions like skin rash, itching or hives, swelling of the face, lips, or tongue blurred vision breathing problems changes in vision low blood counts - This drug may decrease the number of white blood cells, red blood cells and platelets. You may be at increased risk for infections and bleeding. nausea and vomiting pain, redness or irritation at site  where injected pain, tingling, numbness in the hands or feet redness, blistering, peeling, or loosening of the skin, including inside the mouth signs of decreased platelets or bleeding - bruising, pinpoint red spots on the skin, black, tarry stools, nosebleeds signs of decreased red blood cells - unusually weak or tired, fainting spells, lightheadedness signs of infection - fever or chills, cough, sore throat, pain or difficulty passing urine swelling of the ankle, feet, hands Side effects that usually do not require medical attention (report to your doctor or health care professional if they continue or are bothersome): constipation diarrhea fingernail or toenail changes hair loss loss of appetite mouth sores muscle pain This list may not describe all possible side effects. Call your doctor for medical advice about side effects. You may report side effects to FDA at 1-800-FDA-1088. Where should I keep my medication? This drug is given in a hospital or clinic and will not be stored at home. NOTE: This sheet is a summary. It may not cover all possible information. If you have questions about this medicine, talk to your doctor, pharmacist, or health care provider.  2022 Elsevier/Gold Standard (2019-08-25 19:50:31)

## 2021-06-30 NOTE — Addendum Note (Signed)
Addended by: Chauncey Cruel on: 06/30/2021 05:08 PM   Modules accepted: Orders

## 2021-06-30 NOTE — Progress Notes (Signed)
Medication dispensing error at pharmacy

## 2021-06-30 NOTE — Progress Notes (Signed)
Pt is enrolled in the [employer] program for Udenyca for $15,000 for 12 months from 06/30/21.  Pt is eligible to have $0 OOP costs for each Udenyca.

## 2021-07-01 ENCOUNTER — Ambulatory Visit: Payer: BC Managed Care – PPO | Admitting: Physical Therapy

## 2021-07-01 ENCOUNTER — Encounter: Payer: Self-pay | Admitting: General Practice

## 2021-07-01 ENCOUNTER — Other Ambulatory Visit: Payer: Self-pay

## 2021-07-01 ENCOUNTER — Telehealth: Payer: Self-pay | Admitting: *Deleted

## 2021-07-01 DIAGNOSIS — R293 Abnormal posture: Secondary | ICD-10-CM

## 2021-07-01 DIAGNOSIS — C50411 Malignant neoplasm of upper-outer quadrant of right female breast: Secondary | ICD-10-CM

## 2021-07-01 DIAGNOSIS — Z483 Aftercare following surgery for neoplasm: Secondary | ICD-10-CM

## 2021-07-01 DIAGNOSIS — Z170 Estrogen receptor positive status [ER+]: Secondary | ICD-10-CM

## 2021-07-01 DIAGNOSIS — M25611 Stiffness of right shoulder, not elsewhere classified: Secondary | ICD-10-CM

## 2021-07-01 NOTE — Progress Notes (Signed)
[facility] CSW Progress Notes  Call from patient, she was referred to SW by [employer] in search of additional financial support resources.  Will mail her applications for [employer] [place], [employer] and [employer].  After review, she is asked to notify SW team which ones she would like to apply for - we will assist her in this process.  Edwyna Shell, LCSW Clinical Social Worker Phone:  [phone]

## 2021-07-01 NOTE — Telephone Encounter (Signed)
Per call to patient post her first treatment yesterday- she states she has had no issues and is feeling well.  Reviewed with her appt for tomorrow for injection - including for her to proceed directly to the treatment area ( same area she got the chemotherapy ).  No further needs at this time,

## 2021-07-01 NOTE — Therapy (Signed)
[place], [place], [address] Phone: [phone]   Fax:  [phone]  Physical Therapy Treatment  Patient Details  Name: Sophia Moore MRN: [id number] Date of Birth: 12-Mar-1981 Referring Provider (PT): Dr. Wyvonnia Lora   Encounter Date: 07/01/2021   PT End of Session - 07/01/21 0853     Visit Number 3    Number of Visits 10    Date for PT Re-Evaluation 07/21/21    PT Start Time 0803    PT Stop Time 0845    PT Time Calculation (min) 42 min    Activity Tolerance Patient tolerated treatment well    Behavior During Therapy [facility] for tasks assessed/performed             Past Medical History:  Diagnosis Date   Depression    takes Citalopram daily   Family history of breast cancer    Thyroid nodule 07/2016    Past Surgical History:  Procedure Laterality Date   IR IMAGING GUIDED PORT INSERTION  06/27/2021   MASTECTOMY W/ SENTINEL NODE BIOPSY Right 06/01/2021   Procedure: RIGHT MASTECTOMY WITH AXILLARY SENTINEL LYMPH NODE BIOPSY;  Surgeon: Rolm Bookbinder, MD;  Location: [facility];  Service: General;  Laterality: Right;   THYROIDECTOMY  09/13/2016   THYROIDECTOMY N/A 09/13/2016   Procedure: TOTAL THYROIDECTOMY;  Surgeon: Leta Baptist, MD;  Location: [facility];  Service: ENT;  Laterality: N/A;   wisdom teeth extracted      WISDOM TOOTH EXTRACTION      There were no vitals filed for this visit.   Subjective Assessment - 07/01/21 0806     Subjective Pt had her first chemo yesterday and has a little headache today.  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 She underwent a right mastectomy and sentinel node biopsy (2 of 2 positive nodes) on 06/01/2021.  It is ER/PR positive and HER2 negative with a Ki67 of 5%. She had a thyroidectomy in 2017.     Patient Stated Goals See how my arm is doing    Currently in Pain? Yes    Pain Score 1     Pain Location Head                               [facility] Adult PT Treatment/Exercise - 07/01/21 0001       Exercises   Exercises Other Exercises    Other Exercises  meeks decompression with cues to press shoulders back to mat to open up front of chest as tolereated. Pt still bothered by pain from port      Neck Exercises: Seated   Other Seated Exercise neck flex, extension, rotation and side bending x 1 rep in each position. limited by headache pain    Other Seated Exercise also did thoracic rotation and sidebending      Lumbar Exercises: Supine   Pelvic Tilt 5 reps    Bridge 5 reps    Other Supine Lumbar Exercises streching to low back and latts      Knee/Hip Exercises: Sidelying   Hip ABduction Right;5 reps    Hip ABduction Limitations cues for short range and control to activated right core muscles      Shoulder Exercises: Supine   Protraction AROM;Right;Left;10 reps    External Rotation AROM;Right;10 reps  Flexion AAROM;Both;10 reps   with dowel   ABduction AAROM;Right;5 reps    ABduction Limitations as tolerated.. limited by pec tightness    Diagonals AROM;Right;10 reps      Shoulder Exercises: Sidelying   External Rotation AROM;Right;10 reps    ABduction AROM;Right;10 reps    Other Sidelying Exercises small circles with hand to ceiling      Manual Therapy   Manual Therapy Passive ROM    Manual therapy comments to right shoulder in all planes with elbow bent and straight                     PT Education - 07/01/21 0852     Education Details meeks decompression exercises    Person(s) Educated Patient    Methods Explanation;Demonstration;Handout    Comprehension Verbalized understanding;Returned demonstration                 PT Long Term Goals - 06/23/21 1325       PT LONG TERM GOAL #1   Title Patient will demonstrate she has  regained full shoulder ROM and function post operatively compared to bsaelines.    Time 4    Period Weeks    Status On-going    Target Date 07/21/21      PT LONG TERM GOAL #2   Title Patient will increase right shoulder active flexion to >/= 140 degrees for increased ease reaching overhead.    Baseline 97 post op; 148 pre-op    Time 4    Period Weeks    Status New    Target Date 07/21/21      PT LONG TERM GOAL #3   Title Patient will increase right shoulder active abduction to >/= 150 degrees for increased ease obtaining radiation positioning.    Baseline 107 post op; 160 pre-op    Time 4    Period Weeks    Status New    Target Date 07/21/21      PT LONG TERM GOAL #4   Title Patient will verbalize good understanding of lymphedema risk reduction practices.    Time 4    Period Weeks    Status New    Target Date 07/21/21      PT LONG TERM GOAL #5   Title Patient will report >/= 50% improvement in right flank pain to tolerate daily activities with greater ease.    Time 4    Period Weeks    Status New    Target Date 07/21/21                   Plan - 07/01/21 0858     Clinical Impression Statement Pt is doing well with regaining active shoulder motion but was limited today by headache from chemo and pain from port insertion. Gave her a piece of white foam with cut out to wear over port to offer relief form pressure from bra on port that was irritating it. Pt did well with active exercise and should be able to progress next session to pulleys and wall stretches    Stability/Clinical Decision Making Stable/Uncomplicated    Rehab Potential Excellent    PT Frequency 2x / week    PT Duration 4 weeks    PT Treatment/Interventions ADLs/Self Care Home Management;Therapeutic exercise;Patient/family education;Manual techniques;Manual lymph drainage;Passive range of motion;Scar mobilization    PT Next Visit Plan PROM and AAROM exercises for right shoulder. Progress to pulleys  and wall stretches. Focus on posture and  scapular retraction with theraband Stevie Kern when ready.    PT Home Exercise Plan Post op shoulder ROM HEP    Consulted and Agree with Plan of Care Patient             Patient will benefit from skilled therapeutic intervention in order to improve the following deficits and impairments:  Postural dysfunction, Decreased range of motion, Decreased knowledge of precautions, Impaired UE functional use, Pain, Increased fascial restricitons, Decreased scar mobility  Visit Diagnosis: Malignant neoplasm of upper-outer quadrant of right breast in female, estrogen receptor positive ([place])  Abnormal posture  Aftercare following surgery for neoplasm  Stiffness of right shoulder, not elsewhere classified     Problem List Patient Active Problem List   Diagnosis Date Noted   S/P mastectomy, right 06/01/2021   Genetic testing 05/11/2021   Family history of breast cancer 05/04/2021   Malignant neoplasm of upper-outer quadrant of right breast in female, estrogen receptor positive ([place]) 05/02/2021   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 Owens Shark PT  Norwood Levo, PT 07/01/2021, 9:02 AM  [place], [place], [address] Phone: [phone]   Fax:  [phone]  Name: Sophia Moore MRN: [id number] Date of Birth: 05-16-1981

## 2021-07-01 NOTE — Patient Instructions (Signed)

## 2021-07-01 NOTE — Telephone Encounter (Signed)
-----   Message from Baruch Merl, RN sent at 07/01/2021  8:04 AM EDT ----- Regarding: FW: First Time Taxotere and Cytoxan - Dr. Jana Hakim Patient Hi Val, [place] 5 patient in ed today would you mind doing this call back.  If there are others for Dr. Jana Hakim I will forward them to you. Thanks Barbaraann Share ----- Message ----- From: Jolaine Click, RN Sent: 06/30/2021   5:16 PM EDT To: [facility] Triage Nurse [facility] Subject: First Time Taxotere and Cytoxan - Dr. Magrin#  First Time Taxotere and Cytoxan - Dr. Jana Hakim Patient  Pt comfortable with [place] and Spanish

## 2021-07-02 ENCOUNTER — Inpatient Hospital Stay: Payer: BC Managed Care – PPO

## 2021-07-02 VITALS — BP 112/66 | HR 67 | Temp 98.1°F | Resp 17

## 2021-07-02 DIAGNOSIS — C50411 Malignant neoplasm of upper-outer quadrant of right female breast: Secondary | ICD-10-CM

## 2021-07-02 DIAGNOSIS — Z170 Estrogen receptor positive status [ER+]: Secondary | ICD-10-CM

## 2021-07-02 MED ORDER — PEGFILGRASTIM-CBQV 6 MG/0.6ML ~~LOC~~ SOSY
6.0000 mg | PREFILLED_SYRINGE | Freq: Once | SUBCUTANEOUS | Status: AC
  Administered 2021-07-02: 6 mg via SUBCUTANEOUS

## 2021-07-02 NOTE — Patient Instructions (Signed)
Pegfilgrastim injection Qu es este medicamento? El PEGFILGRASTIM es un factor estimulante de colonias de granulocitos de accin prolongada que estimula el crecimiento de los neutrfilos, un tipo de glbulo blanco importante en la lucha del cuerpo contra la infeccin. Se utiliza para reducir la incidencia de la fiebre y la infeccin en pacientes con ciertos tipos de cncer que reciben quimioterapia que afecta la mdula sea, y para aumentar la supervivencia despus de estar expuesto a altas dosis de radiacin. Este medicamento puede ser utilizado para otros usos; si tiene alguna pregunta consulte con su proveedor de atencin mdica o con su farmacutico. MARCAS COMUNES: Fulphila, Neulasta, Nyvepria, UDENYCA, Ziextenzo Qu le debo informar a mi profesional de la salud antes de tomar este medicamento? Necesitan saber si usted presenta alguno de los siguientes problemas o situaciones: enfermedad renal alergia al ltex terapia de radiacin en curso enfermedad de clulas falciformes reacciones en la piel a los adhesivos acrlicos (inyector en el cuerpo solamente) una reaccin alrgica o inusual al pegfilgrastim, al filgrastim, a otros medicamentos, alimentos, colorantes o conservantes si est embarazada o buscando quedar embarazada si est amamantando a un beb Cmo debo utilizar este medicamento? Este medicamento se administra mediante inyeccin por va subcutnea. Si recibe este medicamento en su casa, le ensearn cmo preparar y administrar la jeringa prellenada o cmo usar el inyector en el cuerpo. Consulte las instrucciones de uso para el paciente para obtener instrucciones detalladas. Use el medicamento exactamente como se le indique. Informe de inmediato a su proveedor de atencin de la salud si sospecha que su inyector en el cuerpo puede haber funcionado incorrectamente o si sospecha que el uso de su inyector en el cuerpo hizo que usted recibiera una dosis parcial o no recibiera una dosis. Es  importante que deseche las agujas y las jeringas usadas en un recipiente resistente a los pinchazos. No las deseche en la basura. Si no tiene un recipiente resistente a los pinchazos, llame a su farmacutico o proveedor de atencin de la salud para obtenerlo. Hable con su pediatra para informarse acerca del uso de este medicamento en nios. Aunque este medicamento se puede recetar para ciertas afecciones, existen precauciones que deben tomarse. Sobredosis: Pngase en contacto inmediatamente con un centro toxicolgico o una sala de urgencia si usted cree que haya tomado demasiado medicamento. ATENCIN: Este medicamento es solo para usted. No comparta este medicamento con nadie. Qu sucede si me olvido de una dosis? Es importante no olvidar ninguna dosis. Hable con su mdico o profesional de la salud si olvida su dosis. Si olvida una dosis debido a una falla o fuga del inyector en el cuerpo, se deber administrar una nueva dosis tan pronto como sea posible usando una nica jeringa prellenada para uso manual. Qu puede interactuar con este medicamento? No se han estudiado las interacciones. Puede ser que esta lista no menciona todas las posibles interacciones. Informe a su profesional de la salud de todos los productos a base de hierbas, medicamentos de venta libre o suplementos nutritivos que est tomando. Si usted fuma, consume bebidas alcohlicas o si utiliza drogas ilegales, indqueselo tambin a su profesional de la salud. Algunas sustancias pueden interactuar con su medicamento. A qu debo estar atento al usar este medicamento? Se supervisar su estado de salud atentamente mientras reciba este medicamento. Usted podra necesitar realizarse anlisis de sangre mientras est usando este medicamento. Hable con su proveedor de atencin mdica sobre su riesgo de cncer. Usted puede tener mayor riesgo para ciertos tipos de cncer si usa este   medicamento. Si va a someterse a una IRM (MRI), tomografa  computarizada u otro procedimiento, informe a su mdico que est usando este medicamento (inyector en el cuerpo solamente). Qu efectos secundarios puedo tener al [employer] este medicamento? Efectos secundarios que debe informar a su mdico o a Barrister's clerk de la salud tan pronto como sea posible: Chief of Staff (erupcin cutnea, comezn/picazn o urticaria, hinchazn de la cara, los labios o Barrister's clerk) dolor de Garment/textile technologist, enrojecimiento o Actor de la inyeccin puntos rojos en la piel orina roja o marrn oscuro falta de aire o problemas respiratorios dolor de Paramedic o del costado, o Social research officer, government en el hombro hinchazn cansancio dificultad para Garment/textile technologist o cambios en la cantidad de orina sangrado o moretones inusuales Efectos secundarios que generalmente no requieren Geophysical data processor (infrmelos a su mdico o a Barrister's clerk de la salud si persisten o si son molestos): dolor de Surveyor, quantity Puede ser que esta lista no menciona todos los posibles efectos secundarios. Comunquese a su mdico por asesoramiento mdico [employer] Usted puede informar los efectos secundarios a la FDA por telfono al 1-800-FDA-1088. Dnde debo guardar mi medicina? Mantenga fuera del alcance de los nios. Si est [employer] en su casa, le indicarn cmo guardarlo. Deseche todo el medicamento que no haya utilizado despus de la fecha de vencimiento indicada en la etiqueta. ATENCIN: Este folleto es un resumen. Puede ser que no cubra toda la posible informacin. Si usted tiene preguntas acerca de esta medicina, consulte con su mdico, su farmacutico o su profesional de Technical sales engineer.  2022 Elsevier/Gold Standard (2021-01-03 00:00:00)

## 2021-07-04 ENCOUNTER — Encounter: Payer: Self-pay | Admitting: *Deleted

## 2021-07-05 ENCOUNTER — Other Ambulatory Visit (HOSPITAL_COMMUNITY): Payer: BC Managed Care – PPO

## 2021-07-05 ENCOUNTER — Ambulatory Visit (HOSPITAL_COMMUNITY): Payer: BC Managed Care – PPO

## 2021-07-06 ENCOUNTER — Encounter: Payer: Self-pay | Admitting: *Deleted

## 2021-07-06 ENCOUNTER — Other Ambulatory Visit: Payer: Self-pay

## 2021-07-06 ENCOUNTER — Emergency Department (HOSPITAL_COMMUNITY): Payer: BC Managed Care – PPO

## 2021-07-06 ENCOUNTER — Encounter (HOSPITAL_COMMUNITY): Payer: Self-pay

## 2021-07-06 ENCOUNTER — Emergency Department (HOSPITAL_COMMUNITY)
Admission: EM | Admit: 2021-07-06 | Discharge: 2021-07-06 | Disposition: A | Discharge status: Home / Self Care | Payer: BC Managed Care – PPO | Attending: Emergency Medicine | Admitting: Emergency Medicine

## 2021-07-06 ENCOUNTER — Other Ambulatory Visit: Payer: Self-pay | Admitting: Oncology

## 2021-07-06 DIAGNOSIS — Z853 Personal history of malignant neoplasm of breast: Secondary | ICD-10-CM | POA: Insufficient documentation

## 2021-07-06 DIAGNOSIS — R1030 Lower abdominal pain, unspecified: Secondary | ICD-10-CM

## 2021-07-06 DIAGNOSIS — R197 Diarrhea, unspecified: Secondary | ICD-10-CM | POA: Diagnosis not present

## 2021-07-06 DIAGNOSIS — D709 Neutropenia, unspecified: Secondary | ICD-10-CM | POA: Diagnosis not present

## 2021-07-06 DIAGNOSIS — E039 Hypothyroidism, unspecified: Secondary | ICD-10-CM | POA: Insufficient documentation

## 2021-07-06 DIAGNOSIS — C50411 Malignant neoplasm of upper-outer quadrant of right female breast: Secondary | ICD-10-CM

## 2021-07-06 DIAGNOSIS — Z79899 Other long term (current) drug therapy: Secondary | ICD-10-CM | POA: Diagnosis not present

## 2021-07-06 DIAGNOSIS — Z170 Estrogen receptor positive status [ER+]: Secondary | ICD-10-CM

## 2021-07-06 DIAGNOSIS — Z85850 Personal history of malignant neoplasm of thyroid: Secondary | ICD-10-CM | POA: Insufficient documentation

## 2021-07-06 LAB — CBC
HCT: 46.8 % — ABNORMAL HIGH (ref 36.0–46.0)
Hemoglobin: 15.8 g/dL — ABNORMAL HIGH (ref 12.0–15.0)
MCH: 29.6 pg (ref 26.0–34.0)
MCHC: 33.8 g/dL (ref 30.0–36.0)
MCV: 87.8 fL (ref 80.0–100.0)
Platelets: 161 10*3/uL (ref 150–400)
RBC: 5.33 MIL/uL — ABNORMAL HIGH (ref 3.87–5.11)
RDW: 12.1 % (ref 11.5–15.5)
WBC: 1.5 10*3/uL — ABNORMAL LOW (ref 4.0–10.5)
nRBC: 0 % (ref 0.0–0.2)

## 2021-07-06 LAB — URINALYSIS, ROUTINE W REFLEX MICROSCOPIC
Bilirubin Urine: NEGATIVE
Glucose, UA: NEGATIVE mg/dL
Ketones, ur: NEGATIVE mg/dL
Leukocytes,Ua: NEGATIVE
Nitrite: NEGATIVE
Protein, ur: NEGATIVE mg/dL
Specific Gravity, Urine: 1.01 (ref 1.005–1.030)
pH: 7 (ref 5.0–8.0)

## 2021-07-06 LAB — COMPREHENSIVE METABOLIC PANEL WITH GFR
ALT: 10 U/L (ref 0–44)
AST: 16 U/L (ref 15–41)
Albumin: 3.8 g/dL (ref 3.5–5.0)
Alkaline Phosphatase: 42 U/L (ref 38–126)
Anion gap: 8 (ref 5–15)
BUN: 10 mg/dL (ref 6–20)
CO2: 23 mmol/L (ref 22–32)
Calcium: 9.3 mg/dL (ref 8.9–10.3)
Chloride: 108 mmol/L (ref 98–111)
Creatinine, Ser: 0.61 mg/dL (ref 0.44–1.00)
GFR, Estimated: >60 mL/min
Glucose, Bld: 81 mg/dL (ref 70–99)
Potassium: 3.8 mmol/L (ref 3.5–5.1)
Sodium: 139 mmol/L (ref 135–145)
Total Bilirubin: 0.7 mg/dL (ref 0.3–1.2)
Total Protein: 7 g/dL (ref 6.5–8.1)

## 2021-07-06 LAB — LIPASE, BLOOD: Lipase: 29 U/L (ref 11–51)

## 2021-07-06 LAB — PREGNANCY, URINE: Preg Test, Ur: NEGATIVE

## 2021-07-06 MED ORDER — MORPHINE SULFATE (PF) 4 MG/ML IV SOLN
4.0000 mg | Freq: Once | INTRAVENOUS | Status: AC
  Administered 2021-07-06: 4 mg via INTRAVENOUS
  Filled 2021-07-06: qty 1

## 2021-07-06 MED ORDER — IOHEXOL 9 MG/ML PO SOLN
ORAL | Status: AC
  Administered 2021-07-06: 1000 mL via ORAL
  Filled 2021-07-06: qty 1000

## 2021-07-06 MED ORDER — IOHEXOL 9 MG/ML PO SOLN: 500.0000 mL | ORAL | Status: AC

## 2021-07-06 MED ORDER — SODIUM CHLORIDE 0.9 % IV BOLUS
1000.0000 mL | Freq: Once | INTRAVENOUS | Status: AC
  Administered 2021-07-06: 1000 mL via INTRAVENOUS

## 2021-07-06 MED ORDER — CIPROFLOXACIN HCL 500 MG PO TABS: 500.0000 mg | ORAL_TABLET | Freq: Two times a day (BID) | ORAL | 0 refills | Status: DC

## 2021-07-06 MED ORDER — IOHEXOL 350 MG/ML SOLN
75.0000 mL | Freq: Once | INTRAVENOUS | Status: AC | PRN
  Administered 2021-07-06: 75 mL via INTRAVENOUS

## 2021-07-06 NOTE — ED Notes (Signed)
Pt gone to xray

## 2021-07-06 NOTE — ED Triage Notes (Signed)
Pt arrived via POV, c/o diarrhea and diffuse abd pain x2 days. Denies any vomiting or urinary sx.

## 2021-07-06 NOTE — ED Provider Notes (Signed)
[facility] DEPT Provider Note   CSN: [id number] Arrival date & time: 07/06/21  0645     History Chief Complaint  Patient presents with   Abdominal Pain   Diarrhea    Sophia Moore is a 40 y.o. female with a history of malignant neoplasm of right breast (currently receiving chemotherapy Docetaxel and Cyclophosphamide, last infusion 07/02/21; status post right mastectomy).  Patient just started chemotherapy on 06/30/21.  Presents to the emergency department with a chief complaint of lower abdominal pain and diarrhea.  Patient reports that her abdominal pain started on Monday.  Pain is located across lower abdomen.  Pain started as being with bowel movements only however has become progressively worse and is now constant.  Patient describes pain as "cramping but worse."  Patient rates pain 7/10 on the pain scale.  Patient denies any relief with Tylenol.  Patient reports that she has had diarrhea over the last 2 days.  Patient states that anything she eats goes "straight through her."  Patient denies any recent antibiotic use or international travel.  Patient denies any fevers, chills, nausea, vomiting, blood in stool, melena, dysuria, hematuria, urinary frequency, vaginal bleeding, vaginal pain, vaginal discharge, pelvic pain, genital sores or lesions.  LMP 8/23.  Patient has no history of abdominal surgeries.   Abdominal Pain Associated symptoms: diarrhea   Associated symptoms: no chest pain, no chills, no constipation, no dysuria, no fever, no hematuria, no nausea, no shortness of breath, no vaginal bleeding, no vaginal discharge and no vomiting   Diarrhea Associated symptoms: abdominal pain   Associated symptoms: no chills, no fever, no headaches and no vomiting       Past Medical History:  Diagnosis Date   Depression    takes Citalopram daily   Family history of breast cancer    Thyroid nodule 07/2016    Patient Active Problem List    Diagnosis Date Noted   S/P mastectomy, right 06/01/2021   Genetic testing 05/11/2021   Family history of breast cancer 05/04/2021   Malignant neoplasm of upper-outer quadrant of right breast in female, estrogen receptor positive ([place]) 05/02/2021   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   H/O total thyroidectomy 27/74/1287   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     Family History  Problem Relation Age of Onset   Hyperlipidemia Mother    Stroke Mother    Breast cancer Maternal Aunt        dx 25s    Social History   Tobacco Use   Smoking status: Never   Smokeless tobacco: Never  Vaping Use   Vaping Use: Never used  Substance Use Topics   Alcohol use: No   Drug use: No    Home Medications Prior to Admission medications   Medication Sig Start Date End Date Taking? Authorizing Provider  dexamethasone (DECADRON) 4  MG tablet Take 2 tablets (8 mg total) by mouth 2 (two) times daily. Start the day before Taxotere. Then again the day after chemo for 3 days. 06/23/21   Magrinat, Virgie Dad, MD  levothyroxine (SYNTHROID) 88 MCG tablet TAKE 1 TABLET BY MOUTH EVERY DAY BEFORE BREAKFAST 03/16/21   Cassandria Anger, MD  lidocaine-prilocaine (EMLA) cream Apply to affected area once 06/23/21   Magrinat, Virgie Dad, MD  methocarbamol (ROBAXIN) 750 MG tablet Take 1 tablet (750 mg total)  by mouth 4 (four) times daily as needed (use for muscle cramps/pain). 06/01/21   Rolm Bookbinder, MD  oxyCODONE (OXY IR/ROXICODONE) 5 MG immediate release tablet Take 1 tablet (5 mg total) by mouth every 6 (six) hours as needed. 06/01/21   Rolm Bookbinder, MD  prochlorperazine (COMPAZINE) 10 MG tablet Take 1 tablet (10 mg total) by mouth every 6 (six) hours as needed (Nausea or vomiting). 06/23/21   Magrinat, Virgie Dad, MD    Allergies    Amoxicillin and Penicillins  Review of Systems   Review of Systems  Constitutional:  Negative for chills and fever.  Eyes:  Negative for visual disturbance.  Respiratory:  Negative for shortness of breath.   Cardiovascular:  Negative for chest pain.  Gastrointestinal:  Positive for abdominal pain and diarrhea. Negative for abdominal distention, anal bleeding, blood in stool, constipation, nausea and vomiting.  Genitourinary:  Negative for decreased urine volume, difficulty urinating, dysuria, flank pain, frequency, genital sores, hematuria, pelvic pain, urgency, vaginal bleeding, vaginal discharge and vaginal pain.  Musculoskeletal:  Negative for back pain and neck pain.  Skin:  Negative for color change and rash.  Neurological:  Negative for dizziness, syncope, light-headedness and headaches.  Psychiatric/Behavioral:  Negative for confusion.    Physical Exam Updated Vital Signs BP 117/79 (BP Location: Left Arm)   Pulse 81   Temp 98.5 F (36.9 C) (Oral)   Resp 18   Ht 5\' 1"  (1.549 m)   Wt 51.7 kg   SpO2 96%   BMI 21.54 kg/m   Physical Exam Vitals and nursing note reviewed.  Constitutional:      General: She is not in acute distress.    Appearance: She is not ill-appearing, toxic-appearing or diaphoretic.  HENT:     Head: Normocephalic.  Eyes:     General: No scleral icterus.       Right eye: No discharge.        Left eye: No discharge.  Cardiovascular:     Rate and Rhythm: Normal rate.  Pulmonary:     Effort: Pulmonary effort is  normal. No tachypnea, bradypnea or respiratory distress.     Breath sounds: Normal breath sounds. No stridor.  Abdominal:     General: Bowel sounds are normal. There is no distension. There are no signs of injury.     Palpations: Abdomen is soft. There is no mass or pulsatile mass.     Tenderness: There is abdominal tenderness in the right lower quadrant and left lower quadrant. There is no right CVA tenderness or left CVA tenderness.     Hernia: There is no hernia in the umbilical area or ventral area.  Musculoskeletal:     Right lower leg: Normal.     Left lower leg: Normal.  Skin:    General: Skin is warm and dry.  Neurological:     General: No focal deficit present.     Mental Status: She is alert.  Psychiatric:        Behavior:  Behavior is cooperative.    ED Results / Procedures / Treatments   Labs (all labs ordered are listed, but only abnormal results are displayed) Labs Reviewed  CBC - Abnormal; Notable for the following components:      Result Value   WBC 1.5 (*)    RBC 5.33 (*)    Hemoglobin 15.8 (*)    HCT 46.8 (*)    All other components within normal limits  URINALYSIS, ROUTINE W REFLEX MICROSCOPIC - Abnormal; Notable for the following components:   Hgb urine dipstick SMALL (*)    Bacteria, UA RARE (*)    All other components within normal limits  LIPASE, BLOOD  COMPREHENSIVE METABOLIC PANEL  PREGNANCY, URINE  I-STAT BETA HCG BLOOD, ED ([facility], [facility], [facility] ONLY)    EKG None  Radiology CT ABDOMEN PELVIS W CONTRAST  Result Date: 07/06/2021 CLINICAL DATA:  Diffuse abdominal pain for 2 days EXAM: CT ABDOMEN AND PELVIS WITH CONTRAST TECHNIQUE: Multidetector CT imaging of the abdomen and pelvis was performed using the standard protocol following bolus administration of intravenous contrast. CONTRAST:  30mL OMNIPAQUE IOHEXOL 350 MG/ML SOLN COMPARISON:  None. FINDINGS: Lower chest: No acute abnormality. Hepatobiliary: No focal liver abnormality is seen. Small punctate  calcifications in the liver likely reflecting sequela prior granulomatous disease. No gallstones, gallbladder wall thickening, or biliary dilatation. Pancreas: Unremarkable. No pancreatic ductal dilatation or surrounding inflammatory changes. Spleen: Normal in size without focal abnormality. Adrenals/Urinary Tract: Adrenal glands are unremarkable. Kidneys are normal, without renal calculi, focal lesion, or hydronephrosis. Bladder is unremarkable. Stomach/Bowel: No bowel dilatation to suggest bowel obstruction. No pneumatosis, pneumoperitoneum or portal venous gas. Normal appendix. Bowel wall thickening and pericolonic inflammatory changes involving the rectosigmoid colon and portions of the distal transverse colon and proximal descending colon most consistent with proctocolitis. Small amount of pelvic free fluid. Vascular/Lymphatic: No significant vascular findings are present. No enlarged abdominal or pelvic lymph nodes. Reproductive: Uterus and bilateral adnexa are unremarkable. Other: Small amount of pelvic free fluid.  No abdominal wall hernia. Musculoskeletal: No acute osseous abnormality. No aggressive osseous lesion. IMPRESSION: 1. Findings consistent with acute proctocolitis as detailed above. No focal fluid collection to suggest an abscess. Electronically Signed   By: Kathreen Devoid M.D.   On: 07/06/2021 13:32    Procedures Procedures   Medications Ordered in ED Medications - No data to display  ED Course  I have reviewed the triage vital signs and the nursing notes.  Pertinent labs & imaging results that were available during my care of the patient were reviewed by me and considered in my medical decision making (see chart for details).    MDM Rules/Calculators/A&P                           Alert 40 year old female no acute stress, nontoxic-appearing.  Presents to the emergency department with a chief complaint of abdominal pain and diarrhea.  Patient is currently receiving chemotherapy for  right breast neoplasm.  Patient started chemotherapy on 9/22.  On exam abdomen soft, nondistended, tenderness to bilateral lower quadrants.  Patient afebrile.  Will abdominal pain work-up labs.  Will order CT scan to evaluate for possible diverticulitis, appendicitis, or metastatic disease.  CMP and lipase are unremarkable Urine pregnancy test negative Urinalysis shows no signs of dehydration or infection.   CBC shows neutropenia with white count at 1.5.  This is likely secondary to her recent chemotherapy.  CT abdomen pelvis shows findings consistent with  acute proctocolitis.  No focal fluid collection to suggest an abscess.  Due to findings will consult on-call gastroenterologist.  Spoke with Dr. Paulita Fujita who advised he would not start patient on antibiotics for her proctocolitis as it may be secondary to recent chemotherapy.  Patient reports resolution of her symptoms after receiving 1 dose of morphine.  On serial reexamination abdomen soft, nondistended, nontender.  Patient reports that she has follow-up with oncology tomorrow.  Will discharge patient at this time.  Discussed results, findings, treatment and follow up. Patient advised of return precautions. Patient verbalized understanding and agreed with plan.   Final Clinical Impression(s) / ED Diagnoses Final diagnoses:  Lower abdominal pain  Diarrhea, unspecified type    Rx / DC Orders ED Discharge Orders     None        Dyann Ruddle 07/06/21 1837    Daleen Bo, MD 07/07/21 [id number]

## 2021-07-06 NOTE — ED Notes (Signed)
Called lab to add on pregnancy urine test, requisition sent down.

## 2021-07-06 NOTE — ED Notes (Signed)
Pt states she has port, would like accessed once in room for bloodwork.

## 2021-07-06 NOTE — Progress Notes (Signed)
Sophia Moore went to the emergency room today after 2 days of diarrhea.  She was becoming more dehydrated.  She felt what ever she drank was coming out the wrong way.  They obtained a CT of the abdomen and pelvis which showed some proctocolitis.  She was sent home after some hydration.  Her total white cell count was 1.5.  An ANC was not obtained.  I think she needs antibiotics for the next several days and called her to discuss.  I called the prescription and.  She will start Cipro tonight and take it again tomorrow morning.  She is coming by tomorrow for labs, fluids, and a visit.

## 2021-07-06 NOTE — Discharge Instructions (Addendum)
You came to the emergency department today to be evaluated for your diarrhea and lower abdominal pain.  Your laboratory results were reassuring.  The CT scan showed that you had inflammation around your colon/rectum.  I spoke with the gastroenterologist who did not recommend any further treatment for your symptoms at this time.  Please follow-up with your oncologist.  I have given you information to follow-up with [facility] gastroenterology if your symptoms continue.  Get help right away if: Your pain does not go away as soon as your health care provider told you to expect. You cannot stop vomiting. Your pain is only in areas of the abdomen, such as the right side or the left lower portion of the abdomen. Pain on the right side could be caused by appendicitis. You have bloody or black stools, or stools that look like tar. You have severe pain, cramping, or bloating in your abdomen. You have signs of dehydration, such as: Dark urine, very little urine, or no urine. Cracked lips. Dry mouth. Sunken eyes. Sleepiness. Weakness. You have trouble breathing or chest pain.

## 2021-07-06 NOTE — ED Notes (Signed)
Pt is aware that a urine sample is needed.  

## 2021-07-07 ENCOUNTER — Inpatient Hospital Stay: Payer: BC Managed Care – PPO

## 2021-07-07 ENCOUNTER — Inpatient Hospital Stay (HOSPITAL_BASED_OUTPATIENT_CLINIC_OR_DEPARTMENT_OTHER): Payer: BC Managed Care – PPO | Admitting: Adult Health

## 2021-07-07 ENCOUNTER — Other Ambulatory Visit: Payer: Self-pay

## 2021-07-07 VITALS — BP 112/78 | HR 75 | Resp 18 | Ht 61.0 in | Wt 112.7 lb

## 2021-07-07 VITALS — BP 114/74

## 2021-07-07 DIAGNOSIS — Z170 Estrogen receptor positive status [ER+]: Secondary | ICD-10-CM

## 2021-07-07 DIAGNOSIS — C50411 Malignant neoplasm of upper-outer quadrant of right female breast: Secondary | ICD-10-CM

## 2021-07-07 LAB — CBC WITH DIFFERENTIAL/PLATELET
Abs Immature Granulocytes: 2.03 10*3/uL — ABNORMAL HIGH (ref 0.00–0.07)
Basophils Absolute: 0.1 10*3/uL (ref 0.0–0.1)
Basophils Relative: 1 %
Eosinophils Absolute: 0.1 10*3/uL (ref 0.0–0.5)
Eosinophils Relative: 1 %
HCT: 45.3 % (ref 36.0–46.0)
Hemoglobin: 16 g/dL — ABNORMAL HIGH (ref 12.0–15.0)
Immature Granulocytes: 25 %
Lymphocytes Relative: 22 %
Lymphs Abs: 1.8 10*3/uL (ref 0.7–4.0)
MCH: 29.6 pg (ref 26.0–34.0)
MCHC: 35.3 g/dL (ref 30.0–36.0)
MCV: 83.9 fL (ref 80.0–100.0)
Monocytes Absolute: 1.9 10*3/uL — ABNORMAL HIGH (ref 0.1–1.0)
Monocytes Relative: 23 %
Neutro Abs: 2.4 10*3/uL (ref 1.7–7.7)
Neutrophils Relative %: 28 %
Platelets: 193 10*3/uL (ref 150–400)
RBC: 5.4 MIL/uL — ABNORMAL HIGH (ref 3.87–5.11)
RDW: 11.8 % (ref 11.5–15.5)
WBC: 8.2 10*3/uL (ref 4.0–10.5)
nRBC: 0.4 % — ABNORMAL HIGH (ref 0.0–0.2)

## 2021-07-07 LAB — COMPREHENSIVE METABOLIC PANEL
ALT: 7 U/L (ref 0–44)
AST: 20 U/L (ref 15–41)
Albumin: 3.7 g/dL (ref 3.5–5.0)
Alkaline Phosphatase: 57 U/L (ref 38–126)
Anion gap: 13 (ref 5–15)
BUN: 8 mg/dL (ref 6–20)
CO2: 25 mmol/L (ref 22–32)
Calcium: 9.3 mg/dL (ref 8.9–10.3)
Chloride: 104 mmol/L (ref 98–111)
Creatinine, Ser: 0.77 mg/dL (ref 0.44–1.00)
GFR, Estimated: >60 mL/min (ref >60)
Glucose, Bld: 91 mg/dL (ref 70–99)
Potassium: 3.5 mmol/L (ref 3.5–5.1)
Sodium: 142 mmol/L (ref 135–145)
Total Bilirubin: 0.4 mg/dL (ref 0.3–1.2)
Total Protein: 7.3 g/dL (ref 6.5–8.1)

## 2021-07-07 LAB — PREGNANCY, URINE: Preg Test, Ur: NEGATIVE

## 2021-07-07 MED ORDER — COLESTIPOL HCL 5 G PO GRAN: 5.0000 g | GRANULES | Freq: Two times a day (BID) | ORAL | 0 refills | Status: DC

## 2021-07-07 MED ORDER — HEPARIN SOD (PORK) LOCK FLUSH 100 UNIT/ML IV SOLN
500.0000 [IU] | Freq: Once | INTRAVENOUS | Status: AC
  Administered 2021-07-07: 500 [IU] via INTRAVENOUS

## 2021-07-07 MED ORDER — SODIUM CHLORIDE 0.9 % IV SOLN: Freq: Once | INTRAVENOUS | Status: AC

## 2021-07-07 MED ORDER — SODIUM CHLORIDE 0.9% FLUSH
10.0000 mL | Freq: Once | INTRAVENOUS | Status: AC
  Administered 2021-07-07: 10 mL via INTRAVENOUS

## 2021-07-07 NOTE — Progress Notes (Signed)
[place]  [phone] Fax:([phone]) [phone]     ID: Sophia Moore DOB: 09-02-1981  MR#: [id number]  TKP#:[id number]  Patient Care Team: [facility] as PCP - General (Family Medicine) Mauro Kaufmann, RN as Oncology Nurse Navigator Rockwell Germany, RN as Oncology Nurse Navigator Rolm Bookbinder, MD as Consulting Physician (General Surgery) Magrinat, Virgie Dad, MD as Consulting Physician (Oncology) Eppie Gibson, MD as Attending Physician (Radiation Oncology) Scot Dock, NP OTHER MD:  CHIEF COMPLAINT: Estrogen receptor positive breast cancer  CURRENT TREATMENT: adjvuant chemotherapy    INTERVAL HISTORY: Sophia Moore returns today for follow up of her estrogen receptor positive breast cancer.   She is here for evaluation of cycle 1 day 8 of Docetaxel/Cyclophosphamide.  She began experiencing diarrhea on Tuesday and says that it was constant for three days.  She went to ER yesterday and received IV fluids.  She has been taking cipro since yesterday.  She says that the diarrhea has decreased substantially but she still feels dehydrated and fatigued.  She would like some additional IV fluids today if possible.   REVIEW OF SYSTEMS: Review of Systems  Constitutional:  Positive for fatigue. Negative for appetite change, chills, fever and unexpected weight change.  HENT:   Negative for hearing loss, lump/mass, mouth sores and trouble swallowing.   Eyes:  Negative for eye problems and icterus.  Respiratory:  Negative for chest tightness, cough and shortness of breath.   Cardiovascular:  Negative for chest pain, leg swelling and palpitations.  Gastrointestinal:  Positive for diarrhea. Negative for abdominal distention, abdominal pain, constipation, nausea and vomiting.  Endocrine: Negative for hot flashes.  Genitourinary:  Negative for difficulty urinating.   Musculoskeletal:  Negative for arthralgias.  Skin:   Negative for itching and rash.  Neurological:  Positive for headaches. Negative for dizziness, extremity weakness and numbness.  Hematological:  Negative for adenopathy. Does not bruise/bleed easily.  Psychiatric/Behavioral:  Negative for depression. The patient is not nervous/anxious.     COVID 19 VACCINATION STATUS: not vaccinated, infection 05/2019 (results in [place])   HISTORY OF CURRENT ILLNESS: From the original intake note:  Sophia Moore had routine screening mammography (her first ever) on 03/22/2021 showing a possible abnormality in the right breast. She underwent right diagnostic mammography with tomography and right breast ultrasonography at [facility] on 04/19/2021 showing: breast density category C; palpable 6.3 cm mass involving the central and upper half of the right breast; 2-4 satellite nodules within upper-outer quadrant; no right axillary adenopathy.  Accordingly on 04/26/2021 she proceeded to biopsy of the right breast area in question. 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 Proliferation marker Ki67 at 5%. HER2 equivocal by immunohistochemistry (2+), but negative by fluorescent in situ hybridization with a signals ratio 1.2 and number per cell 1.8. 2. Right Breast, upper-outer axillary tail  - flat epithelial atypia with calcifications  Cancer Staging Malignant neoplasm of upper-outer quadrant of right breast in female, estrogen receptor positive ([place]) Staging form: Breast, AJCC 8th Edition - Clinical stage from 05/04/2021: Stage IIA (cT3, cN0, cM0, G2, ER+, PR+, HER2-) - Signed by Chauncey Cruel, MD on 05/04/2021 Stage prefix: Initial diagnosis Histologic  grading system: 3 grade system Laterality: Right Staged by: Pathologist and managing physician  Stage used in treatment planning: Yes National guidelines used in treatment planning: Yes Type of national guideline used in treatment planning: NCCN  The patient's subsequent history is as detailed below.   PAST MEDICAL HISTORY: Past Medical History:  Diagnosis Date   Depression    takes Citalopram daily   Family history of breast cancer    Thyroid nodule 07/2016    PAST SURGICAL HISTORY: Past Surgical History:  Procedure Laterality Date   IR IMAGING GUIDED PORT INSERTION  06/27/2021   MASTECTOMY W/ SENTINEL NODE BIOPSY Right 06/01/2021   Procedure: RIGHT MASTECTOMY WITH AXILLARY SENTINEL LYMPH NODE BIOPSY;  Surgeon: Rolm Bookbinder, MD;  Location: [place];  Service: General;  Laterality: Right;   THYROIDECTOMY  09/13/2016   THYROIDECTOMY N/A 09/13/2016   Procedure: TOTAL THYROIDECTOMY;  Surgeon: Leta Baptist, MD;  Location: [facility];  Service: ENT;  Laterality: N/A;   wisdom teeth extracted      WISDOM TOOTH EXTRACTION      FAMILY HISTORY: Family History  Problem Relation Age of Onset   Hyperlipidemia Mother    Stroke Mother    Breast cancer Maternal Aunt        dx 63s   Her parents are both living, her father age 67 and her mother age 77, as of 04/2021. Sophia Moore has two brothers and three sisters. She reports breast cancer in a maternal aunt (age 14+).   GYNECOLOGIC HISTORY:  Patient's last menstrual period was 06/02/2021 (exact date). Menarche: unsure Age at first live birth: 40 years old [place] P 3 LMP 04/13/2021 Contraceptive never used HRT n/a  Hysterectomy? no BSO? 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  She is originally from the [place] section of [place].  Husband Efren work in Architect. She lives at home with daughter [name], age 43, and son  [name], age 65. Her oldest son [name], age 81, works at [employer]. She attends a [employer].    ADVANCED DIRECTIVES: In the absence of any documentation to the contrary, the patient's spouse is their HCPOA.    HEALTH MAINTENANCE: Social History   Tobacco Use   Smoking status: Never   Smokeless tobacco: Never  Vaping Use   Vaping Use: Never used  Substance Use Topics   Alcohol use: No   Drug use: No     Colonoscopy: n/a (age)  PAP: 03/2021  Bone density: n/a (age)   Allergies  Allergen Reactions   Amoxicillin Rash   Penicillins Rash    Has patient had a PCN reaction causing immediate rash, facial/tongue/throat swelling, SOB or lightheadedness with hypotension: Yes Has patient had a PCN reaction causing severe rash involving mucus membranes or skin necrosis: No Has patient had a PCN reaction that required hospitalization No Has patient had a PCN reaction occurring within the last 10 years: Yes If all of the above answers are "NO", then may proceed with Cephalosporin use.     Current Outpatient Medications  Medication Sig Dispense Refill   dexamethasone (DECADRON) 4 MG tablet Take 2 tablets (8 mg total) by mouth 2 (two) times daily. Start the day before Taxotere. Then again the day after chemo for 3 days. 30 tablet 1   levothyroxine (SYNTHROID) 88 MCG tablet TAKE 1 TABLET BY MOUTH EVERY DAY BEFORE BREAKFAST 90 tablet 1   lidocaine-prilocaine (EMLA) cream Apply to affected area once 30 g 3  methocarbamol (ROBAXIN) 750 MG tablet Take 1 tablet (750 mg total) by mouth 4 (four) times daily as needed (use for muscle cramps/pain). 30 tablet 0   oxyCODONE (OXY IR/ROXICODONE) 5 MG immediate release tablet Take 1 tablet (5 mg total) by mouth every 6 (six) hours as needed. 10 tablet 0   prochlorperazine (COMPAZINE) 10 MG tablet Take 1 tablet (10 mg total) by mouth every 6 (six) hours as needed (Nausea or vomiting). 30 tablet 1   No current facility-administered medications  for this visit.    OBJECTIVE: Spanish speaker who appears stated age  40:   06/30/21 1253  BP: 117/72  Pulse: 70  Resp: 18  Temp: 97.7 F (36.5 C)  SpO2: 100%     Body mass index is 21.69 kg/m.   Wt Readings from Last 3 Encounters:  06/30/21 114 lb 12.8 oz (52.1 kg)  06/27/21 117 lb (53.1 kg)  06/20/21 117 lb 3.2 oz (53.2 kg)  ECOG FS:1 - Symptomatic but completely ambulatory GENERAL: Patient is a well appearing female in no acute distress HEENT:  Sclerae anicteric.  Oropharynx clear and moist. No ulcerations or evidence of oropharyngeal candidiasis. Neck is supple.  NODES:  No cervical, supraclavicular, or axillary lymphadenopathy palpated.  BREAST EXAM:  right breast s/p mastectomy, covered with steri strips, healing quite well, no sign of infection or any area of concern. LUNGS:  Clear to auscultation bilaterally.  No wheezes or rhonchi. HEART:  Regular rate and rhythm. No murmur appreciated. ABDOMEN:  Soft, nontender.  Positive, normoactive bowel sounds. No organomegaly palpated. MSK:  No focal spinal tenderness to palpation. Full range of motion bilaterally in the upper extremities. EXTREMITIES:  No peripheral edema.   SKIN:  Clear with no obvious rashes or skin changes. No nail dyscrasia. NEURO:  Nonfocal. Well oriented.  Appropriate affect.    LAB RESULTS:  CMP     Component Value Date/Time   NA 141 05/04/2021 0832   K 3.9 05/04/2021 0832   CL 107 05/04/2021 0832   CO2 27 05/04/2021 0832   GLUCOSE 86 05/04/2021 0832   BUN 13 05/04/2021 0832   CREATININE 0.77 05/04/2021 0832   CALCIUM 9.1 05/04/2021 0832   PROT 6.8 05/04/2021 0832   ALBUMIN 3.6 05/04/2021 0832   AST 16 05/04/2021 0832   ALT 7 05/04/2021 0832   ALKPHOS 53 05/04/2021 0832   BILITOT 0.7 05/04/2021 0832   GFRNONAA >60 05/04/2021 0832    No results found for: TOTALPROTELP, ALBUMINELP, A1GS, A2GS, BETS, BETA2SER, GAMS, MSPIKE, SPEI  Lab Results  Component Value Date   WBC 6.4 06/30/2021    NEUTROABS 4.6 06/30/2021   HGB 14.0 06/30/2021   HCT 40.5 06/30/2021   MCV 85.4 06/30/2021   PLT 234 06/30/2021    No results found for: LABCA2  No components found for: TMAUQJ335  No results for input(s): INR in the last 168 hours.  No results found for: LABCA2  No results found for: KTG256  No results found for: LSL373  No results found for: SKA768  No results found for: CA2729  No components found for: HGQUANT  No results found for: CEA1 / No results found for: CEA1   No results found for: AFPTUMOR  No results found for: CHROMOGRNA  No results found for: KPAFRELGTCHN, LAMBDASER, KAPLAMBRATIO (kappa/lambda light chains)  No results found for: HGBA, HGBA2QUANT, HGBFQUANT, HGBSQUAN (Hemoglobinopathy evaluation)   No results found for: LDH  No results found for: IRON, TIBC, IRONPCTSAT (Iron and TIBC)  No results  found for: FERRITIN  Urinalysis No results found for: COLORURINE, APPEARANCEUR, LABSPEC, PHURINE, GLUCOSEU, HGBUR, BILIRUBINUR, KETONESUR, PROTEINUR, UROBILINOGEN, NITRITE, LEUKOCYTESUR   STUDIES: NM Sentinel Node Inj-No Rpt (Breast)  Result Date: 06/01/2021 Sulfur Colloid was injected by the Nuclear Medicine Technologist for sentinel lymph node localization.   IR IMAGING GUIDED PORT INSERTION  Result Date: 06/27/2021 INDICATION: 40 year old female with right breast cancer requiring central venous access for chemotherapy. EXAM: IMPLANTED PORT A CATH PLACEMENT WITH ULTRASOUND AND FLUOROSCOPIC GUIDANCE COMPARISON:  None. MEDICATIONS: None. ANESTHESIA/SEDATION: Moderate (conscious) sedation was employed during this procedure. A total of Versed 1.5 mg and Fentanyl 50 mcg was administered intravenously. Moderate Sedation Time: 24 minutes. The patient's level of consciousness and vital signs were monitored continuously by radiology nursing throughout the procedure under my direct supervision. CONTRAST:  None FLUOROSCOPY TIME:  0 minutes, 6 seconds (0.9 mGy)  COMPLICATIONS: None immediate. PROCEDURE: The procedure, risks, benefits, and alternatives were explained to the patient. Questions regarding the procedure were encouraged and answered. The patient understands and consents to the procedure. The left neck and chest were prepped with chlorhexidine in a sterile fashion, and a sterile drape was applied covering the operative field. Maximum barrier sterile technique with sterile gowns and gloves were used for the procedure. A timeout was performed prior to the initiation of the procedure. Ultrasound was used to examine the jugular vein which was compressible and free of internal echoes. A skin marker was used to demarcate the planned venotomy and port pocket incision sites. Local anesthesia was provided to these sites and the subcutaneous tunnel track with 1% lidocaine with 1:100,000 epinephrine. A small incision was created at the jugular access site and blunt dissection was performed of the subcutaneous tissues. Under ultrasound guidance, the jugular vein was accessed with a 21 ga micropuncture needle and an 0.018" wire was inserted to the superior vena cava. Real-time ultrasound guidance was utilized for vascular access including the acquisition of a permanent ultrasound image documenting patency of the accessed vessel. A 5 Fr micopuncture set was then used, through which a 0.035" Rosen wire was passed under fluoroscopic guidance into the inferior vena cava. An 8 Fr dilator was then placed over the wire. A subcutaneous port pocket was then created along the upper chest wall utilizing a combination of sharp and blunt dissection. The pocket was irrigated with sterile saline, packed with gauze, and observed for hemorrhage. A single lumen "ISP" sized power injectable port was chosen for placement. The 8 Fr catheter was tunneled from the port pocket site to the venotomy incision. The port was placed in the pocket. The external catheter was trimmed to appropriate length.  The dilator was exchanged for an 8 Fr peel-away sheath under fluoroscopic guidance. The catheter was then placed through the sheath and the sheath was removed. Final catheter positioning was confirmed and documented with a fluoroscopic spot radiograph. The port was accessed with a Huber needle, aspirated, and flushed with heparinized saline. The deep dermal layer of the port pocket incision was closed with interrupted 3-0 Vicryl suture. The skin was opposed with a running subcuticular 4-0 Monocryl suture. Dermabond was then placed over the port pocket and neck incisions. The patient tolerated the procedure well without immediate post procedural complication. FINDINGS: After catheter placement, the tip lies within the superior cavoatrial junction. The catheter aspirates and flushes normally and is ready for immediate use. IMPRESSION: Successful placement of a power injectable Port-A-Cath via the left internal jugular vein. The catheter is ready for immediate use.  Ruthann Cancer, MD [facility] Radiology Electronically Signed   By: Ruthann Cancer M.D.   On: 06/27/2021 10:58     ELIGIBLE FOR AVAILABLE RESEARCH PROTOCOL: no  ASSESSMENT: 40 y.o. Spanish speaker status post right breast upper outer quadrant biopsy 04/26/2021 for a clinical mT3 N0, stage IIA invasive ductal carcinoma, grade 1 or 2, estrogen and progesterone receptor positive, HER2 not amplified, with an MIB-1 of 5%.  (1) genetics testing 05/16/2021 through the [employer] panel (report date 05/11/2021) or CancerNext-Expanded + RNAinsight panel (report date 05/16/2021).   The BRCAplus panel offered by [employer] and includes sequencing and deletion/duplication analysis for the following 8 genes: ATM, BRCA1, BRCA2, CDH1, CHEK2, PALB2, PTEN, and TP53. The CancerNext-Expanded + RNAinsight gene panel offered by [employer] and includes sequencing and rearrangement analysis for the following 77 genes:  AIP, ALK, APC, ATM, AXIN2, BAP1, BARD1, BLM, BMPR1A, BRCA1, BRCA2, BRIP1, CDC73, CDH1, CDK4, CDKN1B, CDKN2A, CHEK2, CTNNA1, DICER1, FANCC, FH, FLCN, GALNT12, KIF1B, LZTR1, MAX, MEN1, MET, MLH1, MSH2, MSH3, MSH6, MUTYH, NBN, NF1, NF2, NTHL1, PALB2, PHOX2B, PMS2, POT1, PRKAR1A, PTCH1, PTEN, RAD51C, RAD51D, RB1, RECQL, RET, SDHA, SDHAF2, SDHB, SDHC, SDHD, SMAD4, SMARCA4, SMARCB1, SMARCE1, STK11, SUFU, TMEM127, TP53, TSC1, TSC2, VHL and XRCC2 (sequencing and deletion/duplication); EGFR, EGLN1, HOXB13, KIT, MITF, PDGFRA, POLD1 and POLE (sequencing only); EPCAM and GREM1 (deletion/duplication only). RNA data is routinely analyzed for use in variant interpretation for all genes.  (A) A variant of uncertain significance (VUS) was detected in the [facility] gene called p.R1369H (c.4106G>A).  (2) MammaPrint obtained from the 04/26/2021 biopsy returned luminal a type, low risk, with a predicted benefit from additional chemotherapy in the 1-2% range [given the patient's young age however, the benefit of chemotherapy might be as high as 5%].  (3) status post right mastectomy and sentinel lymph node sampling 06/01/2021 for an mpT3 pN1-2, stage IIA invasive ductal carcinoma, grade 1, with negative margins  (A) a total of 2 right axillary lymph nodes were removed, both positive, 1 with extracapsular extension  (B) completion axillary dissection being considered  (3) adjuvant chemotherapy with docetaxel, cyclophosphamide given on day 1 of a 21 day cycle x 4 from 06/30/2021 through 09/01/2021  (4) adjuvant radiation  (5) tamoxifen started 05/04/2021 in anticipation of surgical delays   PLAN:  Sophia Moore has had increased difficulty with her chemotherapy due to diarrhea.  The diarrhea was improving.  Her WBC yesterday was 1.5, today it is 8.2.  Her count recovery is reassuring that she too will start feeling better soon.    We will give [facility] IV fluids today.  She will continue the Cipro prescribed by Dr. Jana Hakim.  I  reviewed with her how to take imodium and prescribed Cholestipol for her to take to help thicken her stool.   We will see her back in 2 weeks for labs, f/u, and her next visit with [place].  She knows to call for any questions that may arise between now and her next appointment.  We are happy to see her sooner if needed.   Total encounter time: 20 minutes in chart review, lab review, order entry, care coordination, and documentation of the encounter.   Wilber Bihari, NP 06/30/21 1:03 PM Medical Oncology and Hematology [facility] [place], [place] [address] Tel. [phone]    Fax. [phone]    *Total Encounter Time as defined by the Centers for Medicare and Medicaid Services includes, in addition to the face-to-face time of  a patient visit (documented in the note above) non-face-to-face time: obtaining and reviewing outside history, ordering and reviewing medications, tests or procedures, care coordination (communications with other health care professionals or caregivers) and documentation in the medical record.

## 2021-07-07 NOTE — Patient Instructions (Signed)
Rehydration, Adult Rehydration is the replacement of body fluids, salts, and minerals (electrolytes) that are lost during dehydration. Dehydration is when there is not enough water or other fluids in the body. This happens when you lose more fluids than you take in. Common causes of dehydration include: Not drinking enough fluids. This can occur when you are ill or doing activities that require a lot of energy, especially in hot weather. Conditions that cause loss of water or other fluids, such as diarrhea, vomiting, sweating, or urinating a lot. Other illnesses, such as fever or infection. Certain medicines, such as those that remove excess fluid from the body (diuretics). Symptoms of mild or moderate dehydration may include thirst, dry lips and mouth, and dizziness. Symptoms of severe dehydration may include increased heart rate, confusion, fainting, and not urinating. For severe dehydration, you may need to get fluids through an IV at the hospital. For mild or moderate dehydration, you can usually rehydrate at home by drinking certain fluids as told by your health care provider. What are the risks? Generally, rehydration is safe. However, taking in too much fluid (overhydration) can be a problem. This is rare. Overhydration can cause an electrolyte imbalance, kidney failure, or a decrease in salt (sodium) levels in the body. Supplies needed You will need an oral rehydration solution (ORS) if your health care provider tells you to use one. This is a drink to treat dehydration. It can be found in pharmacies and retail stores. How to rehydrate Fluids Follow instructions from your health care provider for rehydration. The kind of fluid and the amount you should drink depend on your condition. In general, you should choose drinks that you prefer. If told by your health care provider, drink an ORS. Make an ORS by following instructions on the package. Start by drinking small amounts, about  cup (120  mL) every 5-10 minutes. Slowly increase how much you drink until you have taken the amount recommended by your health care provider. Drink enough clear fluids to keep your urine pale yellow. If you were told to drink an ORS, finish it first, then start slowly drinking other clear fluids. Drink fluids such as: Water. This includes sparkling water and flavored water. Drinking only water can lead to having too little sodium in your body (hyponatremia). Follow the advice of your health care provider. Water from ice chips you suck on. Fruit juice with water you add to it (diluted). Sports drinks. Hot or cold herbal teas. Broth-based soups. Milk or milk products. Food Follow instructions from your health care provider about what to eat while you rehydrate. Your health care provider may recommend that you slowly begin eating regular foods in small amounts. Eat foods that contain a healthy balance of electrolytes, such as bananas, oranges, potatoes, tomatoes, and spinach. Avoid foods that are greasy or contain a lot of sugar. In some cases, you may get nutrition through a feeding tube that is passed through your nose and into your stomach (nasogastric tube, or NG tube). This may be done if you have uncontrolled vomiting or diarrhea. Beverages to avoid Certain beverages may make dehydration worse. While you rehydrate, avoid drinking alcohol. How to tell if you are recovering from dehydration You may be recovering from dehydration if: You are urinating more often than before you started rehydrating. Your urine is pale yellow. Your energy level improves. You vomit less frequently. You have diarrhea less frequently. Your appetite improves or returns to normal. You feel less dizzy or less light-headed. Your  skin tone and color start to look more normal. Follow these instructions at home: Take over-the-counter and prescription medicines only as told by your health care provider. Do not take sodium  tablets. Doing this can lead to having too much sodium in your body (hypernatremia). Contact a health care provider if: You continue to have symptoms of mild or moderate dehydration, such as: Thirst. Dry lips. Slightly dry mouth. Dizziness. Dark urine or less urine than normal. Muscle cramps. You continue to vomit or have diarrhea. Get help right away if you: Have symptoms of dehydration that get worse. Have a fever. Have a severe headache. Have been vomiting and the following happens: Your vomiting gets worse or does not go away. Your vomit includes blood or green matter (bile). You cannot eat or drink without vomiting. Have problems with urination or bowel movements, such as: Diarrhea that gets worse or does not go away. Blood in your stool (feces). This may cause stool to look black and tarry. Not urinating, or urinating only a small amount of very dark urine, within 6-8 hours. Have trouble breathing. Have symptoms that get worse with treatment. These symptoms may represent a serious problem that is an emergency. Do not wait to see if the symptoms will go away. Get medical help right away. Call your local emergency services (911 in the [place] Do not drive yourself to the hospital. Summary Rehydration is the replacement of body fluids and minerals (electrolytes) that are lost during dehydration. Follow instructions from your health care provider for rehydration. The kind of fluid and amount you should drink depend on your condition. Slowly increase how much you drink until you have taken the amount recommended by your health care provider. Contact your health care provider if you continue to show signs of mild or moderate dehydration. This information is not intended to replace advice given to you by your health care provider. Make sure you discuss any questions you have with your health care provider. Document Revised: 11/26/2019 Document Reviewed: 10/06/2019 Elsevier Patient  Education  2022 [place].  Diarrhea, Adult Diarrhea is frequent loose and watery bowel movements. Diarrhea can make you feel weak and cause you to become dehydrated. Dehydration can make you tired and thirsty, cause you to have a dry mouth, and decrease how often you urinate. Diarrhea typically lasts 2-3 days. However, it can last longer if it is a sign of something more serious. It is important to treat your diarrhea as told by your health care provider. Follow these instructions at home: Eating and drinking   Follow these recommendations as told by your health care provider: Take an oral rehydration solution (ORS). This is an over-the-counter medicine that helps return your body to its normal balance of nutrients and water. It is found at pharmacies and retail stores. Drink plenty of fluids, such as water, ice chips, diluted fruit juice, and low-calorie sports drinks. You can drink milk also, if desired. Avoid drinking fluids that contain a lot of sugar or caffeine, such as energy drinks, sports drinks, and soda. Eat bland, easy-to-digest foods in small amounts as you are able. These foods include bananas, applesauce, rice, lean meats, toast, and crackers. Avoid alcohol. Avoid spicy or fatty foods.  Medicines Take over-the-counter and prescription medicines only as told by your health care provider. If you were prescribed an antibiotic medicine, take it as told by your health care provider. Do not stop using the antibiotic even if you start to feel better. General instructions  Wash  your hands often using soap and water. If soap and water are not available, use a hand sanitizer. Others in the household should wash their hands as well. Hands should be washed: After using the toilet or changing a diaper. Before preparing, cooking, or serving food. While caring for a sick person or while visiting someone in a hospital. Drink enough fluid to keep your urine pale yellow. Rest at home  while you recover. Watch your condition for any changes. Take a warm bath to relieve any burning or pain from frequent diarrhea episodes. Keep all follow-up visits as told by your health care provider. This is important. Contact a health care provider if: You have a fever. Your diarrhea gets worse. You have new symptoms. You cannot keep fluids down. You feel light-headed or dizzy. You have a headache. You have muscle cramps. Get help right away if: You have chest pain. You feel extremely weak or you faint. You have bloody or black stools or stools that look like tar. You have severe pain, cramping, or bloating in your abdomen. You have trouble breathing or you are breathing very quickly. Your heart is beating very quickly. Your skin feels cold and clammy. You feel confused. You have signs of dehydration, such as: Dark urine, very little urine, or no urine. Cracked lips. Dry mouth. Sunken eyes. Sleepiness. Weakness. Summary Diarrhea is frequent loose and watery bowel movements. Diarrhea can make you feel weak and cause you to become dehydrated. Drink enough fluids to keep your urine pale yellow. Make sure that you wash your hands after using the toilet. If soap and water are not available, use hand sanitizer. Contact a health care provider if your diarrhea gets worse or you have new symptoms. Get help right away if you have signs of dehydration. This information is not intended to replace advice given to you by your health care provider. Make sure you discuss any questions you have with your health care provider. Document Revised: 02/11/2019 Document Reviewed: 03/01/2018 Elsevier Patient Education  [place].

## 2021-07-08 ENCOUNTER — Telehealth: Payer: Self-pay | Admitting: *Deleted

## 2021-07-08 NOTE — Telephone Encounter (Signed)
This RN called pt to follow up post her ER visit and IVF's yesterday - with pt stating she if " very much improved "  She is currently on her 3rd day of Cipro and per discussion understands she is to only take this medication for 5 days ( she will have left over in her bottle which she will use post further chemotherapy).  Sophia Moore verbalized understanding of above.

## 2021-07-11 ENCOUNTER — Encounter: Payer: Self-pay | Admitting: Oncology

## 2021-07-12 ENCOUNTER — Ambulatory Visit: Payer: BC Managed Care – PPO | Admitting: Rehabilitation

## 2021-07-12 ENCOUNTER — Encounter: Payer: Self-pay | Admitting: Rehabilitation

## 2021-07-12 ENCOUNTER — Other Ambulatory Visit: Payer: Self-pay

## 2021-07-12 DIAGNOSIS — C50411 Malignant neoplasm of upper-outer quadrant of right female breast: Secondary | ICD-10-CM | POA: Diagnosis not present

## 2021-07-12 DIAGNOSIS — R293 Abnormal posture: Secondary | ICD-10-CM | POA: Diagnosis not present

## 2021-07-12 DIAGNOSIS — Z5111 Encounter for antineoplastic chemotherapy: Secondary | ICD-10-CM | POA: Diagnosis present

## 2021-07-12 DIAGNOSIS — Z8349 Family history of other endocrine, nutritional and metabolic diseases: Secondary | ICD-10-CM | POA: Diagnosis not present

## 2021-07-12 DIAGNOSIS — M25611 Stiffness of right shoulder, not elsewhere classified: Secondary | ICD-10-CM | POA: Diagnosis not present

## 2021-07-12 DIAGNOSIS — Z170 Estrogen receptor positive status [ER+]: Secondary | ICD-10-CM | POA: Diagnosis not present

## 2021-07-12 DIAGNOSIS — C50811 Malignant neoplasm of overlapping sites of right female breast: Secondary | ICD-10-CM | POA: Diagnosis present

## 2021-07-12 DIAGNOSIS — Z5189 Encounter for other specified aftercare: Secondary | ICD-10-CM | POA: Diagnosis not present

## 2021-07-12 DIAGNOSIS — Z79899 Other long term (current) drug therapy: Secondary | ICD-10-CM | POA: Diagnosis not present

## 2021-07-12 DIAGNOSIS — Z823 Family history of stroke: Secondary | ICD-10-CM | POA: Diagnosis not present

## 2021-07-12 DIAGNOSIS — Z803 Family history of malignant neoplasm of breast: Secondary | ICD-10-CM | POA: Diagnosis not present

## 2021-07-12 DIAGNOSIS — Z880 Allergy status to penicillin: Secondary | ICD-10-CM | POA: Diagnosis not present

## 2021-07-12 DIAGNOSIS — Z483 Aftercare following surgery for neoplasm: Secondary | ICD-10-CM

## 2021-07-12 DIAGNOSIS — Z7952 Long term (current) use of systemic steroids: Secondary | ICD-10-CM | POA: Diagnosis not present

## 2021-07-12 DIAGNOSIS — R5383 Other fatigue: Secondary | ICD-10-CM | POA: Diagnosis not present

## 2021-07-12 NOTE — Therapy (Signed)
[facility] @ [place], [place], [address] Phone:     Fax:     Physical Therapy Treatment  Patient Details  Name: Sophia Moore MRN: [id number] Date of Birth: 1981-04-13 Referring Provider (PT): Dr. Wyvonnia Lora   Encounter Date: 07/12/2021   PT End of Session - 07/12/21 0903     Visit Number 4    Number of Visits 10    Date for PT Re-Evaluation 07/21/21    Activity Tolerance Patient tolerated treatment well    Behavior During Therapy [facility] for tasks assessed/performed             Past Medical History:  Diagnosis Date   Depression    takes Citalopram daily   Family history of breast cancer    Thyroid nodule 07/2016    Past Surgical History:  Procedure Laterality Date   IR IMAGING GUIDED PORT INSERTION  06/27/2021   MASTECTOMY W/ SENTINEL NODE BIOPSY Right 06/01/2021   Procedure: RIGHT MASTECTOMY WITH AXILLARY SENTINEL LYMPH NODE BIOPSY;  Surgeon: Rolm Bookbinder, MD;  Location: [facility];  Service: General;  Laterality: Right;   THYROIDECTOMY  09/13/2016   THYROIDECTOMY N/A 09/13/2016   Procedure: TOTAL THYROIDECTOMY;  Surgeon: Leta Baptist, MD;  Location: [facility];  Service: ENT;  Laterality: N/A;   wisdom teeth extracted      WISDOM TOOTH EXTRACTION      There were no vitals filed for this visit.   Subjective Assessment - 07/12/21 0858     Subjective Feeling much better now.  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 She underwent a right mastectomy and sentinel node biopsy (2 of 2 positive nodes) on 06/01/2021.  It is ER/PR positive and HER2 negative with a Ki67 of 5%. She had a thyroidectomy in 2017.    Currently in Pain? No/denies                [facility] PT Assessment - 07/12/21 0001       AROM   Right Shoulder Extension 65 Degrees    Right Shoulder  Flexion 150 Degrees    Right Shoulder ABduction 165 Degrees    Right Shoulder External Rotation 80 Degrees      Palpation   Palpation comment one small cord in the Rt axilla noted in horizontal abduction                           [facility] Adult PT Treatment/Exercise - 07/12/21 0001       Exercises   Exercises Shoulder      Shoulder Exercises: Supine   Flexion AAROM;Both;10 reps    Other Supine Exercises chest stretch supine 20" x 2    Other Supine Exercises LTR with goalpost arms 10" x 2 bil      Shoulder Exercises: Standing   ABduction Right;5 reps    ABduction Limitations 5" hold in front of mirror with dowel      Manual Therapy   Manual Therapy Soft tissue mobilization    Manual therapy comments to the Rt shoulder into all planes with axillary blocking and cording work as able    Soft tissue mobilization to the Rt axilla and cording release  PT Long Term Goals - 06/23/21 1325       PT LONG TERM GOAL #1   Title Patient will demonstrate she has regained full shoulder ROM and function post operatively compared to bsaelines.    Time 4    Period Weeks    Status On-going    Target Date 07/21/21      PT LONG TERM GOAL #2   Title Patient will increase right shoulder active flexion to >/= 140 degrees for increased ease reaching overhead.    Baseline 97 post op; 148 pre-op    Time 4    Period Weeks    Status New    Target Date 07/21/21      PT LONG TERM GOAL #3   Title Patient will increase right shoulder active abduction to >/= 150 degrees for increased ease obtaining radiation positioning.    Baseline 107 post op; 160 pre-op    Time 4    Period Weeks    Status New    Target Date 07/21/21      PT LONG TERM GOAL #4   Title Patient will verbalize good understanding of lymphedema risk reduction practices.    Time 4    Period Weeks    Status New    Target Date 07/21/21      PT LONG TERM GOAL #5   Title Patient  will report >/= 50% improvement in right flank pain to tolerate daily activities with greater ease.    Time 4    Period Weeks    Status New    Target Date 07/21/21                   Plan - 07/12/21 2248     Clinical Impression Statement Pt is doing much better today without headache and port related pain.  AROM has improved greatly from the last session.  Able to progress nicely.  Will continue cording release and MFR/PROm to the Rt axilla    PT Frequency 2x / week    PT Duration 4 weeks    PT Treatment/Interventions ADLs/Self Care Home Management;Therapeutic exercise;Patient/family education;Manual techniques;Manual lymph drainage;Passive range of motion;Scar mobilization    PT Next Visit Plan PROM and AAROM exercises for right shoulder. MRR and STM to the Rt axilla for cording and pectoralis    PT Home Exercise Plan Access code: [email or web address]    Consulted and Agree with Plan of Care Patient             Patient will benefit from skilled therapeutic intervention in order to improve the following deficits and impairments:  Postural dysfunction, Decreased range of motion, Decreased knowledge of precautions, Impaired UE functional use, Pain, Increased fascial restricitons, Decreased scar mobility  Visit Diagnosis: Aftercare following surgery for neoplasm     Problem List Patient Active Problem List   Diagnosis Date Noted   S/P mastectomy, right 06/01/2021   Genetic testing 05/11/2021   Family history of breast cancer 05/04/2021   Malignant neoplasm of upper-outer quadrant of right breast in female, estrogen receptor positive ([place]) 05/02/2021   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

## 2021-07-12 NOTE — Patient Instructions (Signed)
Access code: [facility]

## 2021-07-14 ENCOUNTER — Ambulatory Visit: Payer: BC Managed Care – PPO | Admitting: Rehabilitation

## 2021-07-14 ENCOUNTER — Encounter: Payer: Self-pay | Admitting: Rehabilitation

## 2021-07-14 ENCOUNTER — Other Ambulatory Visit: Payer: Self-pay

## 2021-07-14 DIAGNOSIS — C50411 Malignant neoplasm of upper-outer quadrant of right female breast: Secondary | ICD-10-CM

## 2021-07-14 DIAGNOSIS — M25611 Stiffness of right shoulder, not elsewhere classified: Secondary | ICD-10-CM

## 2021-07-14 DIAGNOSIS — Z170 Estrogen receptor positive status [ER+]: Secondary | ICD-10-CM

## 2021-07-14 DIAGNOSIS — Z483 Aftercare following surgery for neoplasm: Secondary | ICD-10-CM

## 2021-07-14 DIAGNOSIS — R293 Abnormal posture: Secondary | ICD-10-CM

## 2021-07-14 DIAGNOSIS — C50811 Malignant neoplasm of overlapping sites of right female breast: Secondary | ICD-10-CM | POA: Diagnosis not present

## 2021-07-14 NOTE — Therapy (Signed)
[facility] @ [place], [place], [address] Phone:     Fax:     Physical Therapy Treatment  Patient Details  Name: Sophia Moore MRN: [id number] Date of Birth: 1981-04-16 Referring Provider (PT): Dr. Wyvonnia Lora   Encounter Date: 07/14/2021   PT End of Session - 07/14/21 1412     Visit Number 5    Number of Visits 10    Date for PT Re-Evaluation 07/21/21    PT Start Time 1330    PT Stop Time 1410    PT Time Calculation (min) 40 min    Activity Tolerance Patient tolerated treatment well    Behavior During Therapy [facility] for tasks assessed/performed             Past Medical History:  Diagnosis Date   Depression    takes Citalopram daily   Family history of breast cancer    Thyroid nodule 07/2016    Past Surgical History:  Procedure Laterality Date   IR IMAGING GUIDED PORT INSERTION  06/27/2021   MASTECTOMY W/ SENTINEL NODE BIOPSY Right 06/01/2021   Procedure: RIGHT MASTECTOMY WITH AXILLARY SENTINEL LYMPH NODE BIOPSY;  Surgeon: Rolm Bookbinder, MD;  Location: [facility];  Service: General;  Laterality: Right;   THYROIDECTOMY  09/13/2016   THYROIDECTOMY N/A 09/13/2016   Procedure: TOTAL THYROIDECTOMY;  Surgeon: Leta Baptist, MD;  Location: [facility];  Service: ENT;  Laterality: N/A;   wisdom teeth extracted      WISDOM TOOTH EXTRACTION      There were no vitals filed for this visit.   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 She underwent a right mastectomy and sentinel node biopsy (2 of 2 positive nodes) on 06/01/2021.  It is ER/PR positive and HER2 negative with a Ki67 of 5%. She had a thyroidectomy in 2017.    Currently in Pain? No/denies                               [facility] Adult PT Treatment/Exercise -  07/14/21 0001       Shoulder Exercises: Supine   Horizontal ABduction Both;5 reps    Theraband Level (Shoulder Horizontal ABduction) Level 1 (Yellow)    External Rotation Both;5 reps    Theraband Level (Shoulder External Rotation) Level 1 (Yellow)    Diagonals Both;5 reps    Theraband Level (Shoulder Diagonals) Level 1 (Yellow)      Shoulder Exercises: Pulleys   Flexion 2 minutes    Flexion Limitations with intial instruction    Scaption 2 minutes    Scaption Limitations with initial instruction      Shoulder Exercises: Therapy Ball   Flexion 5 reps      Manual Therapy   Manual therapy comments to the Rt shoulder into all planes with axillary blocking and cording work as able    Soft tissue mobilization to the Rt axilla and cording release                          PT Long Term Goals - 06/23/21 1325       PT LONG TERM GOAL #1   Title Patient will demonstrate she has regained full shoulder ROM and function post  operatively compared to bsaelines.    Time 4    Period Weeks    Status On-going    Target Date 07/21/21      PT LONG TERM GOAL #2   Title Patient will increase right shoulder active flexion to >/= 140 degrees for increased ease reaching overhead.    Baseline 97 post op; 148 pre-op    Time 4    Period Weeks    Status New    Target Date 07/21/21      PT LONG TERM GOAL #3   Title Patient will increase right shoulder active abduction to >/= 150 degrees for increased ease obtaining radiation positioning.    Baseline 107 post op; 160 pre-op    Time 4    Period Weeks    Status New    Target Date 07/21/21      PT LONG TERM GOAL #4   Title Patient will verbalize good understanding of lymphedema risk reduction practices.    Time 4    Period Weeks    Status New    Target Date 07/21/21      PT LONG TERM GOAL #5   Title Patient will report >/= 50% improvement in right flank pain to tolerate daily activities with greater ease.    Time 4    Period  Weeks    Status New    Target Date 07/21/21                   Plan - 07/14/21 1412     Clinical Impression Statement Pt continues to improve.  Performed shoulder pulleys and wall ball well today as well as supine scapular exercises except flexion.  Continues with one cordin the Rt axilla along the pectoralis border             Patient will benefit from skilled therapeutic intervention in order to improve the following deficits and impairments:     Visit Diagnosis: Aftercare following surgery for neoplasm  Malignant neoplasm of upper-outer quadrant of right breast in female, estrogen receptor positive ([place])  Stiffness of right shoulder, not elsewhere classified  Abnormal posture     Problem List Patient Active Problem List   Diagnosis Date Noted   S/P mastectomy, right 06/01/2021   Genetic testing 05/11/2021   Family history of breast cancer 05/04/2021   Malignant neoplasm of upper-outer quadrant of right breast in female, estrogen receptor positive ([place]) 05/02/2021   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

## 2021-07-19 ENCOUNTER — Ambulatory Visit: Payer: BC Managed Care – PPO | Admitting: Rehabilitation

## 2021-07-19 ENCOUNTER — Other Ambulatory Visit: Payer: Self-pay

## 2021-07-19 ENCOUNTER — Encounter: Payer: Self-pay | Admitting: Rehabilitation

## 2021-07-19 DIAGNOSIS — Z483 Aftercare following surgery for neoplasm: Secondary | ICD-10-CM

## 2021-07-19 DIAGNOSIS — M25611 Stiffness of right shoulder, not elsewhere classified: Secondary | ICD-10-CM

## 2021-07-19 DIAGNOSIS — C50811 Malignant neoplasm of overlapping sites of right female breast: Secondary | ICD-10-CM | POA: Diagnosis not present

## 2021-07-19 DIAGNOSIS — R293 Abnormal posture: Secondary | ICD-10-CM

## 2021-07-19 DIAGNOSIS — C50411 Malignant neoplasm of upper-outer quadrant of right female breast: Secondary | ICD-10-CM

## 2021-07-19 NOTE — Therapy (Signed)
[facility] @ [place], [place], [address] Phone: [phone]   Fax:  [phone]  Physical Therapy Treatment  Patient Details  Name: Sophia Moore MRN: [id number] Date of Birth: 07/11/1981 Referring Provider (PT): Dr. Wyvonnia Lora   Encounter Date: 07/19/2021   PT End of Session - 07/19/21 0941     Visit Number 6    Number of Visits 10    Date for PT Re-Evaluation 07/21/21    PT Start Time 0900    PT Stop Time 0941    PT Time Calculation (min) 41 min    Activity Tolerance Patient tolerated treatment well    Behavior During Therapy [facility] for tasks assessed/performed             Past Medical History:  Diagnosis Date   Depression    takes Citalopram daily   Family history of breast cancer    Thyroid nodule 07/2016    Past Surgical History:  Procedure Laterality Date   IR IMAGING GUIDED PORT INSERTION  06/27/2021   MASTECTOMY W/ SENTINEL NODE BIOPSY Right 06/01/2021   Procedure: RIGHT MASTECTOMY WITH AXILLARY SENTINEL LYMPH NODE BIOPSY;  Surgeon: Rolm Bookbinder, MD;  Location: [facility];  Service: General;  Laterality: Right;   THYROIDECTOMY  09/13/2016   THYROIDECTOMY N/A 09/13/2016   Procedure: TOTAL THYROIDECTOMY;  Surgeon: Leta Baptist, MD;  Location: [facility];  Service: ENT;  Laterality: N/A;   wisdom teeth extracted      WISDOM TOOTH EXTRACTION      There were no vitals filed for this visit.   Subjective Assessment - 07/19/21 0858     Subjective I have my infusion tomorrow. 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 She underwent a right mastectomy and sentinel node biopsy (2 of 2 positive nodes) on 06/01/2021.  It is ER/PR positive and HER2 negative with a Ki67 of 5%. She had a thyroidectomy in 2017.    Currently in Pain? No/denies                 [facility] PT Assessment - 07/19/21 0001       AROM   Left Shoulder Extension 60 Degrees    Left Shoulder Flexion 147 Degrees   just tight   Left Shoulder ABduction 174 Degrees    Left Shoulder External Rotation 79 Degrees                           [facility] Adult PT Treatment/Exercise - 07/19/21 0001       Shoulder Exercises: Supine   Protraction Both;10 reps    Protraction Weight (lbs) 2    External Rotation Both;10 reps    Theraband Level (Shoulder External Rotation) Level 1 (Yellow)    Flexion AAROM;Both;10 reps    Theraband Level (Shoulder Flexion) Level 1 (Yellow)    Diagonals Both;10 reps    Theraband Level (Shoulder Diagonals) Level 1 (Yellow)    Other Supine Exercises LTR with goalpost arms 10" x 2 bil      Shoulder Exercises: Pulleys   Flexion 3 minutes      Shoulder Exercises: Therapy Ball   Flexion 10 reps      Manual Therapy   Manual Therapy Soft tissue mobilization    Manual therapy comments to the Rt shoulder into  all planes with axillary blocking and cording work as able    Soft tissue mobilization to the Rt axilla and cording release                          PT Long Term Goals - 06/23/21 1325       PT LONG TERM GOAL #1   Title Patient will demonstrate she has regained full shoulder ROM and function post operatively compared to bsaelines.    Time 4    Period Weeks    Status On-going    Target Date 07/21/21      PT LONG TERM GOAL #2   Title Patient will increase right shoulder active flexion to >/= 140 degrees for increased ease reaching overhead.    Baseline 97 post op; 148 pre-op    Time 4    Period Weeks    Status New    Target Date 07/21/21      PT LONG TERM GOAL #3   Title Patient will increase right shoulder active abduction to >/= 150 degrees for increased ease obtaining radiation positioning.    Baseline 107 post op; 160 pre-op    Time 4    Period Weeks    Status New    Target Date  07/21/21      PT LONG TERM GOAL #4   Title Patient will verbalize good understanding of lymphedema risk reduction practices.    Time 4    Period Weeks    Status New    Target Date 07/21/21      PT LONG TERM GOAL #5   Title Patient will report >/= 50% improvement in right flank pain to tolerate daily activities with greater ease.    Time 4    Period Weeks    Status New    Target Date 07/21/21                   Plan - 07/19/21 0941     Clinical Impression Statement Pt has improved AROM to almost back to baseline.  Decreased to 1x per week for the next 2 weeks.    PT Frequency 1x / week    PT Duration 4 weeks    PT Treatment/Interventions ADLs/Self Care Home Management;Therapeutic exercise;Patient/family education;Manual techniques;Manual lymph drainage;Passive range of motion;Scar mobilization    PT Next Visit Plan PROM and AAROM exercises for right shoulder. MRR and STM to the Rt axilla for cording and pectoralis    Consulted and Agree with Plan of Care Patient             Patient will benefit from skilled therapeutic intervention in order to improve the following deficits and impairments:  Postural dysfunction, Decreased range of motion, Decreased knowledge of precautions, Impaired UE functional use, Pain, Increased fascial restricitons, Decreased scar mobility  Visit Diagnosis: Aftercare following surgery for neoplasm  Malignant neoplasm of upper-outer quadrant of right breast in female, estrogen receptor positive ([place])  Abnormal posture  Stiffness of right shoulder, not elsewhere classified     Problem List Patient Active Problem List   Diagnosis Date Noted   S/P mastectomy, right 06/01/2021   Genetic testing 05/11/2021   Family history of breast cancer 05/04/2021   Malignant neoplasm of upper-outer quadrant of right breast in female, estrogen receptor positive ([place]) 05/02/2021   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

## 2021-07-20 ENCOUNTER — Telehealth: Payer: Self-pay | Admitting: *Deleted

## 2021-07-20 ENCOUNTER — Encounter: Payer: Self-pay | Admitting: General Practice

## 2021-07-20 NOTE — Telephone Encounter (Signed)
Prescription for wig faxed to [phone] per pt request.

## 2021-07-20 NOTE — Progress Notes (Signed)
[facility] CSW Progress Notes  Bank statements sent to [place] at patient request for financial assistance application.  She has [employer] [place] and [employer] - she is aware she needs to complete her portion of these applications and give to CSW so they can be submitted.   Edwyna Shell, LCSW Clinical Social Worker Phone:  [phone]

## 2021-07-21 ENCOUNTER — Other Ambulatory Visit: Payer: Self-pay

## 2021-07-21 ENCOUNTER — Inpatient Hospital Stay (HOSPITAL_BASED_OUTPATIENT_CLINIC_OR_DEPARTMENT_OTHER): Payer: BC Managed Care – PPO | Admitting: Adult Health

## 2021-07-21 ENCOUNTER — Inpatient Hospital Stay: Payer: BC Managed Care – PPO | Attending: Adult Health

## 2021-07-21 ENCOUNTER — Encounter: Payer: Self-pay | Admitting: Oncology

## 2021-07-21 ENCOUNTER — Inpatient Hospital Stay: Payer: BC Managed Care – PPO

## 2021-07-21 ENCOUNTER — Encounter: Payer: BC Managed Care – PPO | Admitting: Rehabilitation

## 2021-07-21 VITALS — BP 101/71 | HR 81 | Resp 18 | Ht 61.0 in | Wt 116.3 lb

## 2021-07-21 DIAGNOSIS — Z823 Family history of stroke: Secondary | ICD-10-CM | POA: Insufficient documentation

## 2021-07-21 DIAGNOSIS — Z170 Estrogen receptor positive status [ER+]: Secondary | ICD-10-CM | POA: Diagnosis not present

## 2021-07-21 DIAGNOSIS — Z8349 Family history of other endocrine, nutritional and metabolic diseases: Secondary | ICD-10-CM | POA: Insufficient documentation

## 2021-07-21 DIAGNOSIS — Z5189 Encounter for other specified aftercare: Secondary | ICD-10-CM | POA: Insufficient documentation

## 2021-07-21 DIAGNOSIS — Z95828 Presence of other vascular implants and grafts: Secondary | ICD-10-CM

## 2021-07-21 DIAGNOSIS — C50411 Malignant neoplasm of upper-outer quadrant of right female breast: Secondary | ICD-10-CM | POA: Insufficient documentation

## 2021-07-21 DIAGNOSIS — Z803 Family history of malignant neoplasm of breast: Secondary | ICD-10-CM | POA: Insufficient documentation

## 2021-07-21 DIAGNOSIS — Z7952 Long term (current) use of systemic steroids: Secondary | ICD-10-CM | POA: Insufficient documentation

## 2021-07-21 DIAGNOSIS — C50811 Malignant neoplasm of overlapping sites of right female breast: Secondary | ICD-10-CM | POA: Diagnosis not present

## 2021-07-21 DIAGNOSIS — Z880 Allergy status to penicillin: Secondary | ICD-10-CM | POA: Insufficient documentation

## 2021-07-21 DIAGNOSIS — R5383 Other fatigue: Secondary | ICD-10-CM | POA: Insufficient documentation

## 2021-07-21 DIAGNOSIS — Z483 Aftercare following surgery for neoplasm: Secondary | ICD-10-CM | POA: Insufficient documentation

## 2021-07-21 DIAGNOSIS — R293 Abnormal posture: Secondary | ICD-10-CM | POA: Insufficient documentation

## 2021-07-21 DIAGNOSIS — Z79899 Other long term (current) drug therapy: Secondary | ICD-10-CM | POA: Insufficient documentation

## 2021-07-21 DIAGNOSIS — Z5111 Encounter for antineoplastic chemotherapy: Secondary | ICD-10-CM | POA: Insufficient documentation

## 2021-07-21 DIAGNOSIS — M25611 Stiffness of right shoulder, not elsewhere classified: Secondary | ICD-10-CM | POA: Insufficient documentation

## 2021-07-21 LAB — CMP (CANCER CENTER ONLY)
ALT: 14 U/L (ref 0–44)
AST: 22 U/L (ref 15–41)
Albumin: 4 g/dL (ref 3.5–5.0)
Alkaline Phosphatase: 62 U/L (ref 38–126)
Anion gap: 8 (ref 5–15)
BUN: 14 mg/dL (ref 6–20)
CO2: 26 mmol/L (ref 22–32)
Calcium: 9.3 mg/dL (ref 8.9–10.3)
Chloride: 105 mmol/L (ref 98–111)
Creatinine: 0.69 mg/dL (ref 0.44–1.00)
GFR, Estimated: >60 mL/min (ref >60)
Glucose, Bld: 124 mg/dL — ABNORMAL HIGH (ref 70–99)
Potassium: 3.3 mmol/L — ABNORMAL LOW (ref 3.5–5.1)
Sodium: 139 mmol/L (ref 135–145)
Total Bilirubin: 0.8 mg/dL (ref 0.3–1.2)
Total Protein: 6.4 g/dL — ABNORMAL LOW (ref 6.5–8.1)

## 2021-07-21 LAB — CBC WITH DIFFERENTIAL/PLATELET
Abs Immature Granulocytes: 0.09 10*3/uL — ABNORMAL HIGH (ref 0.00–0.07)
Basophils Absolute: 0 10*3/uL (ref 0.0–0.1)
Basophils Relative: 0 %
Eosinophils Absolute: 0 10*3/uL (ref 0.0–0.5)
Eosinophils Relative: 0 %
HCT: 37.4 % (ref 36.0–46.0)
Hemoglobin: 13.2 g/dL (ref 12.0–15.0)
Immature Granulocytes: 1 %
Lymphocytes Relative: 13 %
Lymphs Abs: 1.5 10*3/uL (ref 0.7–4.0)
MCH: 30.2 pg (ref 26.0–34.0)
MCHC: 35.3 g/dL (ref 30.0–36.0)
MCV: 85.6 fL (ref 80.0–100.0)
Monocytes Absolute: 0.9 10*3/uL (ref 0.1–1.0)
Monocytes Relative: 8 %
Neutro Abs: 8.8 10*3/uL — ABNORMAL HIGH (ref 1.7–7.7)
Neutrophils Relative %: 78 %
Platelets: 274 10*3/uL (ref 150–400)
RBC: 4.37 MIL/uL (ref 3.87–5.11)
RDW: 12.9 % (ref 11.5–15.5)
WBC: 11.3 10*3/uL — ABNORMAL HIGH (ref 4.0–10.5)
nRBC: 0 % (ref 0.0–0.2)

## 2021-07-21 LAB — PREGNANCY, URINE: Preg Test, Ur: NEGATIVE

## 2021-07-21 MED ORDER — PALONOSETRON HCL INJECTION 0.25 MG/5ML
0.2500 mg | Freq: Once | INTRAVENOUS | Status: AC
  Administered 2021-07-21: 0.25 mg via INTRAVENOUS
  Filled 2021-07-21: qty 5

## 2021-07-21 MED ORDER — SODIUM CHLORIDE 0.9 % IV SOLN
10.0000 mg | Freq: Once | INTRAVENOUS | Status: AC
  Administered 2021-07-21: 10 mg via INTRAVENOUS
  Filled 2021-07-21: qty 10

## 2021-07-21 MED ORDER — SODIUM CHLORIDE 0.9% FLUSH
10.0000 mL | INTRAVENOUS | Status: DC | PRN
  Administered 2021-07-21: 10 mL via INTRAVENOUS

## 2021-07-21 MED ORDER — SODIUM CHLORIDE 0.9 % IV SOLN
75.0000 mg/m2 | Freq: Once | INTRAVENOUS | Status: AC
  Administered 2021-07-21: 110 mg via INTRAVENOUS
  Filled 2021-07-21: qty 11

## 2021-07-21 MED ORDER — SODIUM CHLORIDE 0.9% FLUSH
10.0000 mL | INTRAVENOUS | Status: DC | PRN
  Administered 2021-07-21: 10 mL

## 2021-07-21 MED ORDER — SODIUM CHLORIDE 0.9 % IV SOLN
600.0000 mg/m2 | Freq: Once | INTRAVENOUS | Status: AC
  Administered 2021-07-21: 920 mg via INTRAVENOUS
  Filled 2021-07-21: qty 46

## 2021-07-21 MED ORDER — SODIUM CHLORIDE 0.9 % IV SOLN: Freq: Once | INTRAVENOUS | Status: AC

## 2021-07-21 MED ORDER — HEPARIN SOD (PORK) LOCK FLUSH 100 UNIT/ML IV SOLN
500.0000 [IU] | Freq: Once | INTRAVENOUS | Status: AC | PRN
  Administered 2021-07-21: 500 [IU]

## 2021-07-21 NOTE — Progress Notes (Signed)
Ok to proceed with treatment without CMP per Provider  

## 2021-07-21 NOTE — Progress Notes (Signed)
ADDENDUM: I checked with [name] in the treatment area.  She has lost her hair but she says she "still feels pretty" because she has some nice hats and turbines that she can wear.  She had nausea with the first cycle but did not vomit.  She did have horrendous diarrhea and presented to the emergency room quite dehydrated.  She benefited from fluids there and she thinks she really would like to receive fluids this time as well.  On the plus side she is not having neuropathy and she has a good appetite, good sense of taste and continues to walk for exercise.  I have added fluids to be given on days 3 5 and 6.  The orders are written and this has been scheduled.

## 2021-07-21 NOTE — Patient Instructions (Addendum)
[facility] ONCOLOGY  Discharge Instructions: Thank you for choosing [facility] to provide your oncology and hematology care.   If you have a lab appointment with the [facility], please go directly to the [facility] and check in at the registration area.   Wear comfortable clothing and clothing appropriate for easy access to any Portacath or PICC line.   We strive to give you quality time with your provider. You may need to reschedule your appointment if you arrive late (15 or more minutes).  Arriving late affects you and other patients whose appointments are after yours.  Also, if you miss three or more appointments without notifying the office, you may be dismissed from the clinic at the provider's discretion.      For prescription refill requests, have your pharmacy contact our office and allow 72 hours for refills to be completed.    Today you received the following chemotherapy and/or immunotherapy agents:  Docetaxel (Taxotere)  and Cytoxan   To help prevent nausea and vomiting after your treatment, we encourage you to take your nausea medication as directed.  BELOW ARE SYMPTOMS THAT SHOULD BE REPORTED IMMEDIATELY: *FEVER GREATER THAN 100.4 F (38 C) OR HIGHER *CHILLS OR SWEATING *NAUSEA AND VOMITING THAT IS NOT CONTROLLED WITH YOUR NAUSEA MEDICATION *UNUSUAL SHORTNESS OF BREATH *UNUSUAL BRUISING OR BLEEDING *URINARY PROBLEMS (pain or burning when urinating, or frequent urination) *BOWEL PROBLEMS (unusual diarrhea, constipation, pain near the anus) TENDERNESS IN MOUTH AND THROAT WITH OR WITHOUT PRESENCE OF ULCERS (sore throat, sores in mouth, or a toothache) UNUSUAL RASH, SWELLING OR PAIN  UNUSUAL VAGINAL DISCHARGE OR ITCHING   Items with * indicate a potential emergency and should be followed up as soon as possible or go to the Emergency Department if any problems should occur.  Please show the CHEMOTHERAPY ALERT CARD or IMMUNOTHERAPY  ALERT CARD at check-in to the Emergency Department and triage nurse.  Should you have questions after your visit or need to cancel or reschedule your appointment, please contact [facility]  Dept: [phone]  and follow the prompts.  Office hours are 8:00 a.m. to 4:30 p.m. Monday - Friday. Please note that voicemails left after 4:00 p.m. may not be returned until the following business day.  We are closed weekends and major holidays. You have access to a nurse at all times for urgent questions. Please call the main number to the clinic Dept: [phone] and follow the prompts.   For any non-urgent questions, you may also contact your provider using MyChart. We now offer e-Visits for anyone 28 and older to request care online for non-urgent symptoms. For details visit [email or web address].   Also download the MyChart app! Go to the app store, search "MyChart", open the app, select [facility], and log in with your MyChart username and password.  Due to Covid, a mask is required upon entering the hospital/clinic. If you do not have a mask, one will be given to you upon arrival. For doctor visits, patients may have 1 support person aged 79 or older with them. For treatment visits, patients cannot have anyone with them due to current Covid guidelines and our immunocompromised population.

## 2021-07-23 ENCOUNTER — Inpatient Hospital Stay: Payer: BC Managed Care – PPO

## 2021-07-23 ENCOUNTER — Other Ambulatory Visit: Payer: Self-pay

## 2021-07-23 VITALS — BP 101/77 | HR 54 | Temp 97.1°F | Resp 18

## 2021-07-23 DIAGNOSIS — Z170 Estrogen receptor positive status [ER+]: Secondary | ICD-10-CM

## 2021-07-23 DIAGNOSIS — C50811 Malignant neoplasm of overlapping sites of right female breast: Secondary | ICD-10-CM | POA: Diagnosis not present

## 2021-07-23 DIAGNOSIS — C50411 Malignant neoplasm of upper-outer quadrant of right female breast: Secondary | ICD-10-CM

## 2021-07-23 MED ORDER — HEPARIN SOD (PORK) LOCK FLUSH 100 UNIT/ML IV SOLN
500.0000 [IU] | Freq: Once | INTRAVENOUS | Status: AC
  Administered 2021-07-23: 500 [IU] via INTRAVENOUS

## 2021-07-23 MED ORDER — SODIUM CHLORIDE 0.9 % IV SOLN: Freq: Once | INTRAVENOUS | Status: AC

## 2021-07-23 MED ORDER — SODIUM CHLORIDE (PF) 0.9 % IJ SOLN
10.0000 mL | Freq: Once | INTRAMUSCULAR | Status: AC
  Administered 2021-07-23: 10 mL via INTRAVENOUS

## 2021-07-23 MED ORDER — PEGFILGRASTIM-CBQV 6 MG/0.6ML ~~LOC~~ SOSY
6.0000 mg | PREFILLED_SYRINGE | Freq: Once | SUBCUTANEOUS | Status: AC
  Administered 2021-07-23: 6 mg via SUBCUTANEOUS

## 2021-07-23 NOTE — Patient Instructions (Signed)
Rehydration, Adult Rehydration is the replacement of body fluids, salts, and minerals (electrolytes) that are lost during dehydration. Dehydration is when there is not enough water or other fluids in the body. This happens when you lose more fluids than you take in. Common causes of dehydration include: Not drinking enough fluids. This can occur when you are ill or doing activities that require a lot of energy, especially in hot weather. Conditions that cause loss of water or other fluids, such as diarrhea, vomiting, sweating, or urinating a lot. Other illnesses, such as fever or infection. Certain medicines, such as those that remove excess fluid from the body (diuretics). Symptoms of mild or moderate dehydration may include thirst, dry lips and mouth, and dizziness. Symptoms of severe dehydration may include increased heart rate, confusion, fainting, and not urinating. For severe dehydration, you may need to get fluids through an IV at the hospital. For mild or moderate dehydration, you can usually rehydrate at home by drinking certain fluids as told by your health care provider. What are the risks? Generally, rehydration is safe. However, taking in too much fluid (overhydration) can be a problem. This is rare. Overhydration can cause an electrolyte imbalance, kidney failure, or a decrease in salt (sodium) levels in the body. Supplies needed You will need an oral rehydration solution (ORS) if your health care provider tells you to use one. This is a drink to treat dehydration. It can be found in pharmacies and retail stores. How to rehydrate Fluids Follow instructions from your health care provider for rehydration. The kind of fluid and the amount you should drink depend on your condition. In general, you should choose drinks that you prefer. If told by your health care provider, drink an ORS. Make an ORS by following instructions on the package. Start by drinking small amounts, about  cup (120  mL) every 5-10 minutes. Slowly increase how much you drink until you have taken the amount recommended by your health care provider. Drink enough clear fluids to keep your urine pale yellow. If you were told to drink an ORS, finish it first, then start slowly drinking other clear fluids. Drink fluids such as: Water. This includes sparkling water and flavored water. Drinking only water can lead to having too little sodium in your body (hyponatremia). Follow the advice of your health care provider. Water from ice chips you suck on. Fruit juice with water you add to it (diluted). Sports drinks. Hot or cold herbal teas. Broth-based soups. Milk or milk products. Food Follow instructions from your health care provider about what to eat while you rehydrate. Your health care provider may recommend that you slowly begin eating regular foods in small amounts. Eat foods that contain a healthy balance of electrolytes, such as bananas, oranges, potatoes, tomatoes, and spinach. Avoid foods that are greasy or contain a lot of sugar. In some cases, you may get nutrition through a feeding tube that is passed through your nose and into your stomach (nasogastric tube, or NG tube). This may be done if you have uncontrolled vomiting or diarrhea. Beverages to avoid Certain beverages may make dehydration worse. While you rehydrate, avoid drinking alcohol. How to tell if you are recovering from dehydration You may be recovering from dehydration if: You are urinating more often than before you started rehydrating. Your urine is pale yellow. Your energy level improves. You vomit less frequently. You have diarrhea less frequently. Your appetite improves or returns to normal. You feel less dizzy or less light-headed. Your  skin tone and color start to look more normal. Follow these instructions at home: Take over-the-counter and prescription medicines only as told by your health care provider. Do not take sodium  tablets. Doing this can lead to having too much sodium in your body (hypernatremia). Contact a health care provider if: You continue to have symptoms of mild or moderate dehydration, such as: Thirst. Dry lips. Slightly dry mouth. Dizziness. Dark urine or less urine than normal. Muscle cramps. You continue to vomit or have diarrhea. Get help right away if you: Have symptoms of dehydration that get worse. Have a fever. Have a severe headache. Have been vomiting and the following happens: Your vomiting gets worse or does not go away. Your vomit includes blood or green matter (bile). You cannot eat or drink without vomiting. Have problems with urination or bowel movements, such as: Diarrhea that gets worse or does not go away. Blood in your stool (feces). This may cause stool to look black and tarry. Not urinating, or urinating only a small amount of very dark urine, within 6-8 hours. Have trouble breathing. Have symptoms that get worse with treatment. These symptoms may represent a serious problem that is an emergency. Do not wait to see if the symptoms will go away. Get medical help right away. Call your local emergency services (911 in the [place] Do not drive yourself to the hospital. Summary Rehydration is the replacement of body fluids and minerals (electrolytes) that are lost during dehydration. Follow instructions from your health care provider for rehydration. The kind of fluid and amount you should drink depend on your condition. Slowly increase how much you drink until you have taken the amount recommended by your health care provider. Contact your health care provider if you continue to show signs of mild or moderate dehydration. This information is not intended to replace advice given to you by your health care provider. Make sure you discuss any questions you have with your health care provider. Document Revised: 11/26/2019 Document Reviewed: 10/06/2019 Elsevier Patient  Education  [place].  Pegfilgrastim injection What is this medication? PEGFILGRASTIM (PEG fil gra stim) is a long-acting granulocyte colony-stimulating factor that stimulates the growth of neutrophils, a type of white blood cell important in the body's fight against infection. It is used to reduce the incidence of fever and infection in patients with certain types of cancer who are receiving chemotherapy that affects the bone marrow, and to increase survival after being exposed to high doses of radiation. This medicine may be used for other purposes; ask your health care provider or pharmacist if you have questions. COMMON BRAND NAME(S): Rexene Edison, Ziextenzo What should I tell my care team before I take this medication? They need to know if you have any of these conditions: kidney disease latex allergy ongoing radiation therapy sickle cell disease skin reactions to acrylic adhesives (On-Body Injector only) an unusual or allergic reaction to pegfilgrastim, filgrastim, other medicines, foods, dyes, or preservatives pregnant or trying to get pregnant breast-feeding How should I use this medication? This medicine is for injection under the skin. If you get this medicine at home, you will be taught how to prepare and give the pre-filled syringe or how to use the On-body Injector. Refer to the patient Instructions for Use for detailed instructions. Use exactly as directed. Tell your healthcare provider immediately if you suspect that the On-body Injector may not have performed as intended or if you suspect the use of the On-body Injector  resulted in a missed or partial dose. It is important that you put your used needles and syringes in a special sharps container. Do not put them in a trash can. If you do not have a sharps container, call your pharmacist or healthcare provider to get one. Talk to your pediatrician regarding the use of this medicine in children.  While this drug may be prescribed for selected conditions, precautions do apply. Overdosage: If you think you have taken too much of this medicine contact a poison control center or emergency room at once. NOTE: This medicine is only for you. Do not share this medicine with others. What if I miss a dose? It is important not to miss your dose. Call your doctor or health care professional if you miss your dose. If you miss a dose due to an On-body Injector failure or leakage, a new dose should be administered as soon as possible using a single prefilled syringe for manual use. What may interact with this medication? Interactions have not been studied. This list may not describe all possible interactions. Give your health care provider a list of all the medicines, herbs, non-prescription drugs, or dietary supplements you use. Also tell them if you smoke, drink alcohol, or use illegal drugs. Some items may interact with your medicine. What should I watch for while using this medication? Your condition will be monitored carefully while you are receiving this medicine. You may need blood work done while you are taking this medicine. Talk to your health care provider about your risk of cancer. You may be more at risk for certain types of cancer if you take this medicine. If you are going to need a MRI, CT scan, or other procedure, tell your doctor that you are using this medicine (On-Body Injector only). What side effects may I notice from receiving this medication? Side effects that you should report to your doctor or health care professional as soon as possible: allergic reactions (skin rash, itching or hives, swelling of the face, lips, or tongue) back pain dizziness fever pain, redness, or irritation at site where injected pinpoint red spots on the skin red or dark-brown urine shortness of breath or breathing problems stomach or side pain, or pain at the shoulder swelling tiredness trouble  passing urine or change in the amount of urine unusual bruising or bleeding Side effects that usually do not require medical attention (report to your doctor or health care professional if they continue or are bothersome): bone pain muscle pain This list may not describe all possible side effects. Call your doctor for medical advice about side effects. You may report side effects to FDA at 1-800-FDA-1088. Where should I keep my medication? Keep out of the reach of children. If you are using this medicine at home, you will be instructed on how to store it. Throw away any unused medicine after the expiration date on the label. NOTE: This sheet is a summary. It may not cover all possible information. If you have questions about this medicine, talk to your doctor, pharmacist, or health care provider.  2022 Elsevier/Gold Standard (2020-10-22 11:54:14)

## 2021-07-25 ENCOUNTER — Inpatient Hospital Stay: Payer: BC Managed Care – PPO

## 2021-07-25 ENCOUNTER — Encounter: Payer: Self-pay | Admitting: Oncology

## 2021-07-25 ENCOUNTER — Encounter: Payer: Self-pay | Admitting: Adult Health

## 2021-07-25 ENCOUNTER — Other Ambulatory Visit: Payer: Self-pay

## 2021-07-25 DIAGNOSIS — C50411 Malignant neoplasm of upper-outer quadrant of right female breast: Secondary | ICD-10-CM

## 2021-07-25 DIAGNOSIS — Z170 Estrogen receptor positive status [ER+]: Secondary | ICD-10-CM

## 2021-07-25 DIAGNOSIS — C50811 Malignant neoplasm of overlapping sites of right female breast: Secondary | ICD-10-CM | POA: Diagnosis not present

## 2021-07-25 MED ORDER — SODIUM CHLORIDE 0.9% FLUSH
10.0000 mL | INTRAVENOUS | Status: DC | PRN
  Administered 2021-07-25: 10 mL via INTRAVENOUS

## 2021-07-25 MED ORDER — SODIUM CHLORIDE 0.9 % IV SOLN: Freq: Once | INTRAVENOUS | Status: AC

## 2021-07-25 MED ORDER — HEPARIN SOD (PORK) LOCK FLUSH 100 UNIT/ML IV SOLN
500.0000 [IU] | Freq: Once | INTRAVENOUS | Status: AC
  Administered 2021-07-25: 500 [IU] via INTRAVENOUS

## 2021-07-25 NOTE — Progress Notes (Signed)
[place]  [phone]) [phone] [phone]     ID: Sophia Moore DOB: April 29, 1981  MR#: [id number]  IOX#:[id number]  Patient Care Team: [facility] as PCP - General (Family Medicine) Mauro Kaufmann, RN as Oncology Nurse Navigator Rockwell Germany, RN as Oncology Nurse Navigator Rolm Bookbinder, MD as Consulting Physician (General Surgery) Magrinat, Virgie Dad, MD as Consulting Physician (Oncology) Eppie Gibson, MD as Attending Physician (Radiation Oncology) Scot Dock, NP OTHER MD:  CHIEF COMPLAINT: Estrogen receptor positive breast cancer  CURRENT TREATMENT: adjvuant chemotherapy    INTERVAL HISTORY: Sophia Moore returns today for follow up of her estrogen receptor positive breast cancer.   She is here for evaluation of cycle 2 day 1 of Docetaxel/Cyclophosphamide. After her first cycle she developed diarrhea that has since resolved.  She was evaluated int he ER after cycle 1.  She was prescribed Cipro, and received IV fluids which helped her feel better faster.  She would like to proceed with this after her second cycle.    Verdis Frederickson notes that her energy level is still slightly low, however it has improved from prior.  She is eating and drinking well.  She denies peripheral neuropathy.    REVIEW OF SYSTEMS: Review of Systems  Constitutional:  Positive for fatigue. Negative for appetite change, chills, fever and unexpected weight change.  HENT:   Negative for hearing loss, lump/mass, mouth sores and trouble swallowing.   Eyes:  Negative for eye problems and icterus.  Respiratory:  Negative for chest tightness, cough and shortness of breath.   Cardiovascular:  Negative for chest pain, leg swelling and palpitations.  Gastrointestinal:  Negative for abdominal distention, abdominal pain, constipation, diarrhea, nausea and vomiting.  Endocrine: Negative for hot flashes.  Genitourinary:  Negative for difficulty  urinating.   Musculoskeletal:  Negative for arthralgias.  Skin:  Negative for itching and rash.  Neurological:  Negative for dizziness, extremity weakness, headaches and numbness.  Hematological:  Negative for adenopathy. Does not bruise/bleed easily.  Psychiatric/Behavioral:  Negative for depression. The patient is not nervous/anxious.     COVID 19 VACCINATION STATUS: not vaccinated, infection 05/2019 (results in [place])   HISTORY OF CURRENT ILLNESS: From the original intake note:  [name] had routine screening mammography (her first ever) on 03/22/2021 showing a possible abnormality in the right breast. She underwent right diagnostic mammography with tomography and right breast ultrasonography at [facility] on 04/19/2021 showing: breast density category C; palpable 6.3 cm mass involving the central and upper half of the right breast; 2-4 satellite nodules within upper-outer quadrant; no right axillary adenopathy.  Accordingly on 04/26/2021 she proceeded to biopsy of the right breast area in question. 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 Proliferation marker Ki67 at 5%. HER2 equivocal by immunohistochemistry (2+), but negative by fluorescent in situ hybridization with a signals ratio 1.2 and number per cell 1.8. 2. Right Breast, upper-outer axillary tail  - flat epithelial atypia with calcifications  Cancer Staging Malignant neoplasm of upper-outer quadrant of right breast in female, estrogen receptor positive ([place]) Staging form: Breast, AJCC 8th Edition - Clinical stage from 05/04/2021: Stage IIA (cT3, cN0, cM0, G2, ER+, PR+, HER2-) - Signed by Chauncey Cruel, MD on  05/04/2021 Stage prefix: Initial  diagnosis Histologic grading system: 3 grade system Laterality: Right Staged by: Pathologist and managing physician Stage used in treatment planning: Yes National guidelines used in treatment planning: Yes Type of national guideline used in treatment planning: NCCN  The patient's subsequent history is as detailed below.   PAST MEDICAL HISTORY: Past Medical History:  Diagnosis Date   Depression    takes Citalopram daily   Family history of breast cancer    Thyroid nodule 07/2016    PAST SURGICAL HISTORY: Past Surgical History:  Procedure Laterality Date   IR IMAGING GUIDED PORT INSERTION  06/27/2021   MASTECTOMY W/ SENTINEL NODE BIOPSY Right 06/01/2021   Procedure: RIGHT MASTECTOMY WITH AXILLARY SENTINEL LYMPH NODE BIOPSY;  Surgeon: Rolm Bookbinder, MD;  Location: [place];  Service: General;  Laterality: Right;   THYROIDECTOMY  09/13/2016   THYROIDECTOMY N/A 09/13/2016   Procedure: TOTAL THYROIDECTOMY;  Surgeon: Leta Baptist, MD;  Location: [facility];  Service: ENT;  Laterality: N/A;   wisdom teeth extracted      WISDOM TOOTH EXTRACTION      FAMILY HISTORY: Family History  Problem Relation Age of Onset   Hyperlipidemia Mother    Stroke Mother    Breast cancer Maternal Aunt        dx 22s   Her parents are both living, her father age 41 and her mother age 47, as of 04/2021. Sophia Moore has two brothers and three sisters. She reports breast cancer in a maternal aunt (age 62+).   GYNECOLOGIC HISTORY:  Patient's last menstrual period was 06/02/2021 (exact date). Menarche: unsure Age at first live birth: 40 years old [place] P 3 LMP 04/13/2021 Contraceptive never used HRT n/a  Hysterectomy? no BSO? 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  She is originally from the [place] section of [place].  Husband Efren work in Architect.  She lives at home with daughter Sophia Moore, age 64, and [name], age 43. Her oldest [name], age 74, works at [employer]. She attends a [employer].    ADVANCED DIRECTIVES: In the absence of any documentation to the contrary, the patient's spouse is their HCPOA.    HEALTH MAINTENANCE: Social History   Tobacco Use   Smoking status: Never   Smokeless tobacco: Never  Vaping Use   Vaping Use: Never used  Substance Use Topics   Alcohol use: No   Drug use: No     Colonoscopy: n/a (age)  PAP: 03/2021  Bone density: n/a (age)   Allergies  Allergen Reactions   Amoxicillin Rash   Penicillins Rash    Has patient had a PCN reaction causing immediate rash, facial/tongue/throat swelling, SOB or lightheadedness with hypotension: Yes Has patient had a PCN reaction causing severe rash involving mucus membranes or skin necrosis: No Has patient had a PCN reaction that required hospitalization No Has patient had a PCN reaction occurring within the last 10 years: Yes If all of the above answers are "NO", then may proceed with Cephalosporin use.     Current Outpatient Medications  Medication Sig Dispense Refill   dexamethasone (DECADRON) 4 MG tablet Take 2 tablets (8 mg total) by mouth 2 (two) times daily. Start the day before Taxotere. Then again the day after chemo for 3 days. 30 tablet 1   levothyroxine (SYNTHROID) 88 MCG tablet TAKE 1 TABLET BY MOUTH EVERY DAY BEFORE BREAKFAST 90  tablet 1   lidocaine-prilocaine (EMLA) cream Apply to affected area once 30 g 3   methocarbamol (ROBAXIN) 750 MG tablet Take 1 tablet (750 mg total) by mouth 4 (four) times daily as needed (use for muscle cramps/pain). 30 tablet 0   oxyCODONE (OXY IR/ROXICODONE) 5 MG immediate release tablet Take 1 tablet (5 mg total) by mouth every 6 (six) hours as needed. 10 tablet 0   prochlorperazine (COMPAZINE) 10 MG tablet Take 1 tablet (10 mg total) by mouth every 6 (six) hours as needed (Nausea or vomiting). 30 tablet  1   No current facility-administered medications for this visit.    OBJECTIVE: Spanish speaker who appears stated age  79:   06/30/21 1253  BP: 117/72  Pulse: 70  Resp: 18  Temp: 97.7 F (36.5 C)  SpO2: 100%     Body mass index is 21.69 kg/m.   Wt Readings from Last 3 Encounters:  06/30/21 114 lb 12.8 oz (52.1 kg)  06/27/21 117 lb (53.1 kg)  06/20/21 117 lb 3.2 oz (53.2 kg)  ECOG FS:1 - Symptomatic but completely ambulatory GENERAL: Patient is a well appearing female in no acute distress HEENT:  Sclerae anicteric.  Oropharynx clear and moist. No ulcerations or evidence of oropharyngeal candidiasis. Neck is supple.  NODES:  No cervical, supraclavicular, or axillary lymphadenopathy palpated.  BREAST EXAM:  right breast s/p mastectomy, well healed, no area of concern LUNGS:  Clear to auscultation bilaterally.  No wheezes or rhonchi. HEART:  Regular rate and rhythm. No murmur appreciated. ABDOMEN:  Soft, nontender.  Positive, normoactive bowel sounds. No organomegaly palpated. MSK:  No focal spinal tenderness to palpation. Full range of motion bilaterally in the upper extremities. EXTREMITIES:  No peripheral edema.   SKIN:  Clear with no obvious rashes or skin changes. No nail dyscrasia. NEURO:  Nonfocal. Well oriented.  Appropriate affect.    LAB RESULTS:  CMP     Component Value Date/Time   NA 141 05/04/2021 0832   K 3.9 05/04/2021 0832   CL 107 05/04/2021 0832   CO2 27 05/04/2021 0832   GLUCOSE 86 05/04/2021 0832   BUN 13 05/04/2021 0832   CREATININE 0.77 05/04/2021 0832   CALCIUM 9.1 05/04/2021 0832   PROT 6.8 05/04/2021 0832   ALBUMIN 3.6 05/04/2021 0832   AST 16 05/04/2021 0832   ALT 7 05/04/2021 0832   ALKPHOS 53 05/04/2021 0832   BILITOT 0.7 05/04/2021 0832   GFRNONAA >60 05/04/2021 0832    No results found for: TOTALPROTELP, ALBUMINELP, A1GS, A2GS, BETS, BETA2SER, GAMS, MSPIKE, SPEI  Lab Results  Component Value Date   WBC 6.4 06/30/2021    NEUTROABS 4.6 06/30/2021   HGB 14.0 06/30/2021   HCT 40.5 06/30/2021   MCV 85.4 06/30/2021   PLT 234 06/30/2021    No results found for: LABCA2  No components found for: [id number]  No results for input(s): INR in the last 168 hours.  No results found for: LABCA2  No results found for: UDJ497  No results found for: WYO378  No results found for: HYI502  No results found for: CA2729  No components found for: HGQUANT  No results found for: CEA1 / No results found for: CEA1   No results found for: AFPTUMOR  No results found for: CHROMOGRNA  No results found for: KPAFRELGTCHN, LAMBDASER, KAPLAMBRATIO (kappa/lambda light chains)  No results found for: HGBA, HGBA2QUANT, HGBFQUANT, HGBSQUAN (Hemoglobinopathy evaluation)   No results found for: LDH  No results found for: IRON, TIBC,  IRONPCTSAT (Iron and TIBC)  No results found for: FERRITIN  Urinalysis No results found for: COLORURINE, APPEARANCEUR, LABSPEC, PHURINE, GLUCOSEU, HGBUR, BILIRUBINUR, KETONESUR, PROTEINUR, UROBILINOGEN, NITRITE, LEUKOCYTESUR   STUDIES: NM Sentinel Node Inj-No Rpt (Breast)  Result Date: 06/01/2021 Sulfur Colloid was injected by the Nuclear Medicine Technologist for sentinel lymph node localization.   IR IMAGING GUIDED PORT INSERTION  Result Date: 06/27/2021 INDICATION: 40 year old female with right breast cancer requiring central venous access for chemotherapy. EXAM: IMPLANTED PORT A CATH PLACEMENT WITH ULTRASOUND AND FLUOROSCOPIC GUIDANCE COMPARISON:  None. MEDICATIONS: None. ANESTHESIA/SEDATION: Moderate (conscious) sedation was employed during this procedure. A total of Versed 1.5 mg and Fentanyl 50 mcg was administered intravenously. Moderate Sedation Time: 24 minutes. The patient's level of consciousness and vital signs were monitored continuously by radiology nursing throughout the procedure under my direct supervision. CONTRAST:  None FLUOROSCOPY TIME:  0 minutes, 6 seconds (0.9 mGy)  COMPLICATIONS: None immediate. PROCEDURE: The procedure, risks, benefits, and alternatives were explained to the patient. Questions regarding the procedure were encouraged and answered. The patient understands and consents to the procedure. The left neck and chest were prepped with chlorhexidine in a sterile fashion, and a sterile drape was applied covering the operative field. Maximum barrier sterile technique with sterile gowns and gloves were used for the procedure. A timeout was performed prior to the initiation of the procedure. Ultrasound was used to examine the jugular vein which was compressible and free of internal echoes. A skin marker was used to demarcate the planned venotomy and port pocket incision sites. Local anesthesia was provided to these sites and the subcutaneous tunnel track with 1% lidocaine with 1:100,000 epinephrine. A small incision was created at the jugular access site and blunt dissection was performed of the subcutaneous tissues. Under ultrasound guidance, the jugular vein was accessed with a 21 ga micropuncture needle and an 0.018" wire was inserted to the superior vena cava. Real-time ultrasound guidance was utilized for vascular access including the acquisition of a permanent ultrasound image documenting patency of the accessed vessel. A 5 Fr micopuncture set was then used, through which a 0.035" Rosen wire was passed under fluoroscopic guidance into the inferior vena cava. An 8 Fr dilator was then placed over the wire. A subcutaneous port pocket was then created along the upper chest wall utilizing a combination of sharp and blunt dissection. The pocket was irrigated with sterile saline, packed with gauze, and observed for hemorrhage. A single lumen "ISP" sized power injectable port was chosen for placement. The 8 Fr catheter was tunneled from the port pocket site to the venotomy incision. The port was placed in the pocket. The external catheter was trimmed to appropriate length.  The dilator was exchanged for an 8 Fr peel-away sheath under fluoroscopic guidance. The catheter was then placed through the sheath and the sheath was removed. Final catheter positioning was confirmed and documented with a fluoroscopic spot radiograph. The port was accessed with a Huber needle, aspirated, and flushed with heparinized saline. The deep dermal layer of the port pocket incision was closed with interrupted 3-0 Vicryl suture. The skin was opposed with a running subcuticular 4-0 Monocryl suture. Dermabond was then placed over the port pocket and neck incisions. The patient tolerated the procedure well without immediate post procedural complication. FINDINGS: After catheter placement, the tip lies within the superior cavoatrial junction. The catheter aspirates and flushes normally and is ready for immediate use. IMPRESSION: Successful placement of a power injectable Port-A-Cath via the left internal jugular vein.  The catheter is ready for immediate use. Ruthann Cancer, MD [facility] Electronically Signed   By: Ruthann Cancer M.D.   On: 06/27/2021 10:58     ELIGIBLE FOR AVAILABLE RESEARCH PROTOCOL: no  ASSESSMENT: 40 y.o. Spanish speaker status post right breast upper outer quadrant biopsy 04/26/2021 for a clinical mT3 N0, stage IIA invasive ductal carcinoma, grade 1 or 2, estrogen and progesterone receptor positive, HER2 not amplified, with an MIB-1 of 5%.  (1) genetics testing 05/16/2021 through the [employer] panel (report date 05/11/2021) or CancerNext-Expanded + RNAinsight panel (report date 05/16/2021).   The BRCAplus panel offered by [employer] and includes sequencing and deletion/duplication analysis for the following 8 genes: ATM, BRCA1, BRCA2, CDH1, CHEK2, PALB2, PTEN, and TP53. The CancerNext-Expanded + RNAinsight gene panel offered by [employer] and includes sequencing and rearrangement analysis for the following 77 genes:  AIP, ALK, APC, ATM, AXIN2, BAP1, BARD1, BLM, BMPR1A, BRCA1, BRCA2, BRIP1, CDC73, CDH1, CDK4, CDKN1B, CDKN2A, CHEK2, CTNNA1, DICER1, FANCC, FH, FLCN, GALNT12, KIF1B, LZTR1, MAX, MEN1, MET, MLH1, MSH2, MSH3, MSH6, MUTYH, NBN, NF1, NF2, NTHL1, PALB2, PHOX2B, PMS2, POT1, PRKAR1A, PTCH1, PTEN, RAD51C, RAD51D, RB1, RECQL, RET, SDHA, SDHAF2, SDHB, SDHC, SDHD, SMAD4, SMARCA4, SMARCB1, SMARCE1, STK11, SUFU, TMEM127, TP53, TSC1, TSC2, VHL and XRCC2 (sequencing and deletion/duplication); EGFR, EGLN1, HOXB13, KIT, MITF, PDGFRA, POLD1 and POLE (sequencing only); EPCAM and GREM1 (deletion/duplication only). RNA data is routinely analyzed for use in variant interpretation for all genes.  (A) A variant of uncertain significance (VUS) was detected in the [facility] gene called p.R1369H (c.4106G>A).  (2) MammaPrint obtained from the 04/26/2021 biopsy returned luminal a type, low risk, with a predicted benefit from additional chemotherapy in the 1-2% range [given the patient's young age however, the benefit of chemotherapy might be as high as 5%].  (3) status post right mastectomy and sentinel lymph node sampling 06/01/2021 for an mpT3 pN1-2, stage IIA invasive ductal carcinoma, grade 1, with negative margins  (A) a total of 2 right axillary lymph nodes were removed, both positive, 1 with extracapsular extension  (B) completion axillary dissection being considered  (3) adjuvant chemotherapy with docetaxel, cyclophosphamide given on day 1 of a 21 day cycle x 4 from 06/30/2021 through 09/01/2021  (4) adjuvant radiation  (5) tamoxifen started 05/04/2021 in anticipation of surgical delays   PLAN:  Sahra is here today to receive her second cycle of adjuvant docetaxel and cyclophosphamide.  Her labs thus far are stable today.  We reviewed her issues with diarrhea after her last cycle and I have requested scheduling for her to receive fluids following her treatments, particularly on her injection day.  I also sent this  request to nursing.    Tyrea will proceed with her treatment today.  We reviewed how to take Imodium should she develop diarrhea.    She will return in 1 week for labs, f/u, and IV fluids if needed.    Total encounter time: 20 minutes in face to face visit time, chart review, lab review, order entry, care coordination, and documentation of the encounter.   Wilber Bihari, NP 06/30/21 1:03 PM Medical Oncology and Hematology [facility] [place], [place] [address] Tel. [phone]    Fax. [phone]    *Total Encounter Time as defined by the Centers for Medicare and Medicaid Services includes, in addition to the face-to-face time of a patient visit (documented in the note above) non-face-to-face time: obtaining and reviewing outside history, ordering and  reviewing medications, tests or procedures, care coordination (communications with other health care professionals or caregivers) and documentation in the medical record.  

## 2021-07-26 ENCOUNTER — Encounter: Payer: Self-pay | Admitting: Rehabilitation

## 2021-07-26 ENCOUNTER — Inpatient Hospital Stay: Payer: BC Managed Care – PPO

## 2021-07-26 ENCOUNTER — Other Ambulatory Visit: Payer: Self-pay

## 2021-07-26 ENCOUNTER — Ambulatory Visit: Payer: BC Managed Care – PPO | Admitting: Rehabilitation

## 2021-07-26 ENCOUNTER — Inpatient Hospital Stay (HOSPITAL_BASED_OUTPATIENT_CLINIC_OR_DEPARTMENT_OTHER): Payer: BC Managed Care – PPO | Admitting: Adult Health

## 2021-07-26 ENCOUNTER — Encounter: Payer: Self-pay | Admitting: *Deleted

## 2021-07-26 VITALS — BP 94/59 | HR 74 | Temp 97.5°F | Resp 18 | Ht 61.0 in | Wt 117.2 lb

## 2021-07-26 VITALS — BP 99/69 | HR 73

## 2021-07-26 DIAGNOSIS — C50411 Malignant neoplasm of upper-outer quadrant of right female breast: Secondary | ICD-10-CM

## 2021-07-26 DIAGNOSIS — R293 Abnormal posture: Secondary | ICD-10-CM

## 2021-07-26 DIAGNOSIS — Z483 Aftercare following surgery for neoplasm: Secondary | ICD-10-CM

## 2021-07-26 DIAGNOSIS — C50811 Malignant neoplasm of overlapping sites of right female breast: Secondary | ICD-10-CM | POA: Diagnosis not present

## 2021-07-26 DIAGNOSIS — Z170 Estrogen receptor positive status [ER+]: Secondary | ICD-10-CM | POA: Diagnosis not present

## 2021-07-26 DIAGNOSIS — M25611 Stiffness of right shoulder, not elsewhere classified: Secondary | ICD-10-CM

## 2021-07-26 DIAGNOSIS — Z95828 Presence of other vascular implants and grafts: Secondary | ICD-10-CM

## 2021-07-26 LAB — COMPREHENSIVE METABOLIC PANEL
ALT: 11 U/L (ref 0–44)
AST: 13 U/L — ABNORMAL LOW (ref 15–41)
Albumin: 3.4 g/dL — ABNORMAL LOW (ref 3.5–5.0)
Alkaline Phosphatase: 71 U/L (ref 38–126)
Anion gap: 11 (ref 5–15)
BUN: 14 mg/dL (ref 6–20)
CO2: 27 mmol/L (ref 22–32)
Calcium: 8.8 mg/dL — ABNORMAL LOW (ref 8.9–10.3)
Chloride: 104 mmol/L (ref 98–111)
Creatinine, Ser: 0.68 mg/dL (ref 0.44–1.00)
GFR, Estimated: >60 mL/min (ref >60)
Glucose, Bld: 85 mg/dL (ref 70–99)
Potassium: 4 mmol/L (ref 3.5–5.1)
Sodium: 142 mmol/L (ref 135–145)
Total Bilirubin: 0.6 mg/dL (ref 0.3–1.2)
Total Protein: 6.1 g/dL — ABNORMAL LOW (ref 6.5–8.1)

## 2021-07-26 LAB — CBC WITH DIFFERENTIAL/PLATELET
Abs Immature Granulocytes: 0.8 10*3/uL — ABNORMAL HIGH (ref 0.00–0.07)
Basophils Absolute: 0.1 10*3/uL (ref 0.0–0.1)
Basophils Relative: 1 %
Eosinophils Absolute: 0 10*3/uL (ref 0.0–0.5)
Eosinophils Relative: 1 %
HCT: 38.8 % (ref 36.0–46.0)
Hemoglobin: 13.4 g/dL (ref 12.0–15.0)
Immature Granulocytes: 21 %
Lymphocytes Relative: 22 %
Lymphs Abs: 0.8 10*3/uL (ref 0.7–4.0)
MCH: 29.8 pg (ref 26.0–34.0)
MCHC: 34.5 g/dL (ref 30.0–36.0)
MCV: 86.2 fL (ref 80.0–100.0)
Monocytes Absolute: 0.1 10*3/uL (ref 0.1–1.0)
Monocytes Relative: 1 %
Neutro Abs: 2 10*3/uL (ref 1.7–7.7)
Neutrophils Relative %: 54 %
Platelets: 143 10*3/uL — ABNORMAL LOW (ref 150–400)
RBC: 4.5 MIL/uL (ref 3.87–5.11)
RDW: 12.9 % (ref 11.5–15.5)
Smear Review: DECREASED
WBC: 3.8 10*3/uL — ABNORMAL LOW (ref 4.0–10.5)
nRBC: 0 % (ref 0.0–0.2)

## 2021-07-26 LAB — PREGNANCY, URINE: Preg Test, Ur: NEGATIVE

## 2021-07-26 MED ORDER — SODIUM CHLORIDE 0.9% FLUSH
10.0000 mL | INTRAVENOUS | Status: DC | PRN
  Administered 2021-07-26: 10 mL via INTRAVENOUS

## 2021-07-26 MED ORDER — HEPARIN SOD (PORK) LOCK FLUSH 100 UNIT/ML IV SOLN
500.0000 [IU] | Freq: Once | INTRAVENOUS | Status: AC
Start: 2021-07-26 — End: 2021-07-26
  Administered 2021-07-26: 500 [IU] via INTRAVENOUS

## 2021-07-26 MED ORDER — SODIUM CHLORIDE 0.9 % IV SOLN: Freq: Once | INTRAVENOUS | Status: AC

## 2021-07-26 NOTE — Patient Instructions (Signed)

## 2021-07-26 NOTE — Therapy (Signed)
[facility] @ [place], [place], [address] Phone: [phone]   Fax:  [phone]  Physical Therapy Treatment  Patient Details  Name: Sophia Moore MRN: [id number] Date of Birth: 31-Oct-1980 Referring Provider (PT): Dr. Wyvonnia Lora   Encounter Date: 07/26/2021   PT End of Session - 07/26/21 0952     Visit Number 7    Number of Visits 10    Date for PT Re-Evaluation 07/21/21    PT Start Time 0900    PT Stop Time 0950    PT Time Calculation (min) 50 min    Activity Tolerance Patient tolerated treatment well    Behavior During Therapy [facility] for tasks assessed/performed             Past Medical History:  Diagnosis Date   Depression    takes Citalopram daily   Family history of breast cancer    Thyroid nodule 07/2016    Past Surgical History:  Procedure Laterality Date   IR IMAGING GUIDED PORT INSERTION  06/27/2021   MASTECTOMY W/ SENTINEL NODE BIOPSY Right 06/01/2021   Procedure: RIGHT MASTECTOMY WITH AXILLARY SENTINEL LYMPH NODE BIOPSY;  Surgeon: Rolm Bookbinder, MD;  Location: [place];  Service: General;  Laterality: Right;   THYROIDECTOMY  09/13/2016   THYROIDECTOMY N/A 09/13/2016   Procedure: TOTAL THYROIDECTOMY;  Surgeon: Leta Baptist, MD;  Location: [facility];  Service: ENT;  Laterality: N/A;   wisdom teeth extracted      WISDOM TOOTH EXTRACTION      There were no vitals filed for this visit.   Subjective Assessment - 07/26/21 0904     Subjective This time was better.  I am getting fluids every day.    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 She underwent a right mastectomy and sentinel node biopsy (2 of 2 positive nodes) on 06/01/2021.  It is ER/PR positive and HER2 negative with a Ki67 of 5%. She had a thyroidectomy in 2017.    Currently in Pain? No/denies                                [facility] Adult PT Treatment/Exercise - 07/26/21 0001       Shoulder Exercises: Supine   Horizontal ABduction Both;10 reps    Theraband Level (Shoulder Horizontal ABduction) Level 1 (Yellow)    Horizontal ABduction Limitations 2 sets    External Rotation Both;10 reps    Theraband Level (Shoulder External Rotation) Level 1 (Yellow)    External Rotation Limitations 2 sets    Flexion AAROM;Both;10 reps    Flexion Limitations dowel flexion with 1#    Other Supine Exercises chest press 4# x 10  2    Other Supine Exercises one min rest between all sets and exercises today      Shoulder Exercises: Pulleys   Flexion 3 minutes      Shoulder Exercises: Therapy Ball   Flexion 10 reps      Manual Therapy   Manual therapy comments to the Rt shoulder into all planes with axillary blocking and cording work as able    Soft tissue mobilization to the Rt axilla and cording release                          PT Long Term Goals - 06/23/21  1325       PT LONG TERM GOAL #1   Title Patient will demonstrate she has regained full shoulder ROM and function post operatively compared to bsaelines.    Time 4    Period Weeks    Status On-going    Target Date 07/21/21      PT LONG TERM GOAL #2   Title Patient will increase right shoulder active flexion to >/= 140 degrees for increased ease reaching overhead.    Baseline 97 post op; 148 pre-op    Time 4    Period Weeks    Status New    Target Date 07/21/21      PT LONG TERM GOAL #3   Title Patient will increase right shoulder active abduction to >/= 150 degrees for increased ease obtaining radiation positioning.    Baseline 107 post op; 160 pre-op    Time 4    Period Weeks    Status New    Target Date 07/21/21      PT LONG TERM GOAL #4   Title Patient will verbalize good understanding of lymphedema risk reduction practices.    Time 4    Period Weeks    Status New    Target Date 07/21/21       PT LONG TERM GOAL #5   Title Patient will report >/= 50% improvement in right flank pain to tolerate daily activities with greater ease.    Time 4    Period Weeks    Status New    Target Date 07/21/21                   Plan - 07/26/21 0952     Clinical Impression Statement Pt fatigued but feeling better than last infusion.  Has been getting fluids the past few days.  Able to progress exercises to more reps and 1 cord is palpable in the axilla but not limiting    PT Frequency 1x / week    PT Duration 4 weeks    PT Treatment/Interventions ADLs/Self Care Home Management;Therapeutic exercise;Patient/family education;Manual techniques;Manual lymph drainage;Passive range of motion;Scar mobilization    PT Next Visit Plan DC/goal visit, talk about after radiation, PROM and AAROM exercises for right shoulder. MRR and STM to the Rt axilla for cording and pectoralis    Consulted and Agree with Plan of Care Patient             Patient will benefit from skilled therapeutic intervention in order to improve the following deficits and impairments:  Postural dysfunction, Decreased range of motion, Decreased knowledge of precautions, Impaired UE functional use, Pain, Increased fascial restricitons, Decreased scar mobility  Visit Diagnosis: Aftercare following surgery for neoplasm  Malignant neoplasm of upper-outer quadrant of right breast in female, estrogen receptor positive ([place])  Abnormal posture  Stiffness of right shoulder, not elsewhere classified     Problem List Patient Active Problem List   Diagnosis Date Noted   S/P mastectomy, right 06/01/2021   Genetic testing 05/11/2021   Family history of breast cancer 05/04/2021   Malignant neoplasm of upper-outer quadrant of right breast in female, estrogen receptor positive ([place]) 05/02/2021   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

## 2021-07-26 NOTE — Progress Notes (Signed)
Sophia Moore  [phone]) [phone] [phone]     ID: Sophia Moore DOB: 07-12-1981  MR#: [id number]  DGL#:[id number]  Patient Care Team: [facility] as [facility]CP - General (Family Medicine) Sophia Kaufmann, RN as Oncology Nurse Navigator Sophia Germany, RN as Oncology Nurse Navigator Sophia Bookbinder, Moore as Consulting Physician (General Surgery) Magrinat, Virgie Dad, Moore as Consulting Physician (Oncology) Sophia Gibson, Moore as Attending Physician (Radiation Oncology) Sophia Dock, NP OTHER Moore:  Sophia Moore: Estrogen receptor positive breast cancer  CURRENT TREATMENT: adjvuant chemotherapy    INTERVAL HISTORY: Sophia Moore returns today for follow up of her estrogen receptor positive breast cancer.   She is here for evaluation of cycle 2 day 6 of Sophia Moore. She tolerated her chemotherapy well this most recent time.  She says that receiving the fluids on shot day made a big impact and she is mildly fatigued, but does not feel dehydrated.  She denies diarrhea and also peripheral neuropathy.    REVIEW OF SYSTEMS: Review of Systems  Constitutional:  Positive for fatigue. Negative for appetite change, chills, fever and unexpected weight change.  HENT:   Negative for hearing loss, lump/mass, mouth sores and trouble swallowing.   Eyes:  Negative for eye problems and icterus.  Respiratory:  Negative for chest tightness, cough and shortness of breath.   Cardiovascular:  Negative for chest pain, leg swelling and palpitations.  Gastrointestinal:  Negative for abdominal distention, abdominal pain, constipation, diarrhea, nausea and vomiting.  Endocrine: Negative for hot flashes.  Genitourinary:  Negative for difficulty urinating.   Musculoskeletal:  Negative for arthralgias.  Skin:  Negative for itching and rash.  Neurological:  Negative for dizziness, extremity weakness, headaches and numbness.   Hematological:  Negative for adenopathy. Does not bruise/bleed easily.  Psychiatric/Behavioral:  Negative for depression. The patient is not nervous/anxious.     COVID 19 VACCINATION STATUS: not vaccinated, infection 05/2019 (results in [place])   HISTORY OF CURRENT ILLNESS: From the original intake note:  Sophia Moore had routine screening mammography (her first ever) on 03/22/2021 showing a possible abnormality in the right breast. She underwent right diagnostic mammography with tomography and right breast ultrasonography at [facility] on 04/19/2021 showing: breast density category C; palpable 6.3 cm mass involving the central and upper half of the right breast; 2-4 satellite nodules within upper-outer quadrant; no right axillary adenopathy.  Accordingly on 04/26/2021 she proceeded to biopsy of the right breast area in question. 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 Proliferation marker Ki67 at 5%. HER2 equivocal by immunohistochemistry (2+), but negative by fluorescent in situ hybridization with a signals ratio 1.2 and number per cell 1.8. 2. Right Breast, upper-outer axillary tail  - flat epithelial atypia with calcifications  Cancer Staging Malignant neoplasm of upper-outer quadrant of right breast in female, estrogen receptor positive (Sophia Moore) Staging form: Breast, AJCC 8th Edition - Clinical stage from 05/04/2021: Stage IIA (cT3, cN0, cM0, G2, ER+, PR+, HER2-) - Signed by Sophia Moore on 05/04/2021 Stage prefix: Initial diagnosis Histologic grading system: 3 grade system Laterality: Right Staged by: Pathologist and managing physician Stage used in treatment  planning: Yes National guidelines used in treatment planning: Yes Type of national guideline used in treatment planning: NCCN  The patient's subsequent history is as detailed below.   PAST MEDICAL HISTORY: Past Medical History:  Diagnosis Date   Depression    takes Citalopram daily   Family history of breast cancer    Thyroid nodule 07/2016    PAST SURGICAL HISTORY: Past Surgical History:  Procedure Laterality Date   IR IMAGING GUIDED PORT INSERTION  06/27/2021   MASTECTOMY W/ SENTINEL NODE BIOPSY Right 06/01/2021   Procedure: RIGHT MASTECTOMY WITH AXILLARY SENTINEL LYMPH NODE BIOPSY;  Surgeon: Sophia Bookbinder, Moore;  Location: [place];  Service: General;  Laterality: Right;   THYROIDECTOMY  09/13/2016   THYROIDECTOMY N/A 09/13/2016   Procedure: TOTAL THYROIDECTOMY;  Surgeon: Sophia Baptist, Moore;  Location: [facility];  Service: ENT;  Laterality: N/A;   wisdom teeth extracted      WISDOM TOOTH EXTRACTION      FAMILY HISTORY: Family History  Problem Relation Age of Onset   Hyperlipidemia Mother    Stroke Mother    Breast cancer Maternal Aunt        dx 36s   Her parents are both living, her father age 67 and her mother age 74, as of 04/2021. Sophia Moore has two brothers and three sisters. She reports breast cancer in a maternal aunt (age 39+).   GYNECOLOGIC HISTORY:  Patient's last menstrual period was 06/02/2021 (exact date). Menarche: unsure Age at first live birth: 40 years old Sophia Moore 3 LMP 04/13/2021 Contraceptive never used HRT n/a  Hysterectomy? no BSO? 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  She is originally from the [place] section of [place].  Husband Sophia Moore work in Architect. She lives at home with daughter Sophia Moore, age 57, and [name], age 31. Her oldest [name], age 73, works at [employer]. She attends a [employer].    ADVANCED  DIRECTIVES: In the absence of any documentation to the contrary, the patient's spouse is their HCPOA.    HEALTH MAINTENANCE: Social History   Tobacco Use   Smoking status: Never   Smokeless tobacco: Never  Vaping Use   Vaping Use: Never used  Substance Use Topics   Alcohol use: No   Drug use: No     Colonoscopy: n/a (age)  PAP: 03/2021  Bone density: n/a (age)   Allergies  Allergen Reactions   Amoxicillin Rash   Penicillins Rash    Has patient had a PCN reaction causing immediate rash, facial/tongue/throat swelling, SOB or lightheadedness with hypotension: Yes Has patient had a PCN reaction causing severe rash involving mucus membranes or skin necrosis: No Has patient had a PCN reaction that required hospitalization No Has patient had a PCN reaction occurring within the last 10 years: Yes If all of the above answers are "NO", then may proceed with Cephalosporin use.     Current Outpatient Medications  Medication Sig Dispense Refill   dexamethasone (DECADRON) 4 MG tablet Take 2 tablets (8 mg total) by mouth 2 (two) times daily. Start the day before Taxotere. Then again the day after chemo for 3 days. 30 tablet 1   levothyroxine (SYNTHROID) 88 MCG tablet TAKE 1 TABLET BY MOUTH EVERY DAY BEFORE BREAKFAST 90 tablet 1   lidocaine-prilocaine (EMLA) cream Apply to affected area once 30 g 3   methocarbamol (ROBAXIN) 750 MG tablet Take 1 tablet (750 mg total) by mouth 4 (four) times daily as needed (use for  muscle cramps/pain). 30 tablet 0   oxyCODONE (OXY IR/ROXICODONE) 5 MG immediate release tablet Take 1 tablet (5 mg total) by mouth every 6 (six) hours as needed. 10 tablet 0   prochlorperazine (COMPAZINE) 10 MG tablet Take 1 tablet (10 mg total) by mouth every 6 (six) hours as needed (Nausea or vomiting). 30 tablet 1   No current facility-administered medications for this visit.    OBJECTIVE: Spanish speaker who appears stated age  57:   06/30/21 1253  BP: 117/72  Pulse:  70  Resp: 18  Temp: 97.7 F (36.5 C)  SpO2: 100%     Body mass index is 21.69 kg/m.   Wt Readings from Last 3 Encounters:  06/30/21 114 lb 12.8 oz (52.1 kg)  06/27/21 117 lb (53.1 kg)  06/20/21 117 lb 3.2 oz (53.2 kg)  ECOG FS:1 - Symptomatic but completely ambulatory GENERAL: Patient is a well appearing female in no acute distress HEENT:  Sclerae anicteric.  Oropharynx clear and moist. No ulcerations or evidence of oropharyngeal candidiasis. Neck is supple.  NODES:  No cervical, supraclavicular, or axillary lymphadenopathy palpated.  BREAST EXAM:  right breast s/Moore mastectomy, well healed, no area of concern LUNGS:  Clear to auscultation bilaterally.  No wheezes or rhonchi. HEART:  Regular rate and rhythm. No murmur appreciated. ABDOMEN:  Soft, nontender.  Positive, normoactive bowel sounds. No organomegaly palpated. MSK:  No focal spinal tenderness to palpation. Full range of motion bilaterally in the upper extremities. EXTREMITIES:  No peripheral edema.   SKIN:  Clear with no obvious rashes or skin changes. No nail dyscrasia. NEURO:  Nonfocal. Well oriented.  Appropriate affect.    LAB RESULTS:  CMP     Component Value Date/Time   NA 141 05/04/2021 0832   K 3.9 05/04/2021 0832   CL 107 05/04/2021 0832   CO2 27 05/04/2021 0832   GLUCOSE 86 05/04/2021 0832   BUN 13 05/04/2021 0832   CREATININE 0.77 05/04/2021 0832   CALCIUM 9.1 05/04/2021 0832   PROT 6.8 05/04/2021 0832   ALBUMIN 3.6 05/04/2021 0832   AST 16 05/04/2021 0832   ALT 7 05/04/2021 0832   ALKPHOS 53 05/04/2021 0832   BILITOT 0.7 05/04/2021 0832   GFRNONAA >60 05/04/2021 0832    No results found for: TOTALPROTELP, ALBUMINELP, A1GS, A2GS, BETS, BETA2SER, GAMS, MSPIKE, SPEI  Lab Results  Component Value Date   WBC 6.4 06/30/2021   NEUTROABS 4.6 06/30/2021   HGB 14.0 06/30/2021   HCT 40.5 06/30/2021   MCV 85.4 06/30/2021   PLT 234 06/30/2021    No results found for: LABCA2  No components found for:  PPIRJJ884  No results for input(s): INR in the last 168 hours.  No results found for: LABCA2  No results found for: ZYS063  No results found for: KZS010  No results found for: XNA355  No results found for: CA2729  No components found for: HGQUANT  No results found for: CEA1 / No results found for: CEA1   No results found for: AFPTUMOR  No results found for: CHROMOGRNA  No results found for: KPAFRELGTCHN, LAMBDASER, KAPLAMBRATIO (kappa/lambda light chains)  No results found for: HGBA, HGBA2QUANT, HGBFQUANT, HGBSQUAN (Hemoglobinopathy evaluation)   No results found for: LDH  No results found for: IRON, TIBC, IRONPCTSAT (Iron and TIBC)  No results found for: FERRITIN  Urinalysis No results found for: COLORURINE, APPEARANCEUR, LABSPEC, PHURINE, GLUCOSEU, HGBUR, BILIRUBINUR, KETONESUR, PROTEINUR, UROBILINOGEN, NITRITE, LEUKOCYTESUR   STUDIES: NM Sentinel Node Inj-No Rpt (Breast)  Result Date: 06/01/2021 Sulfur Colloid was injected by the Nuclear Medicine Technologist for sentinel lymph node localization.   IR IMAGING GUIDED PORT INSERTION  Result Date: 06/27/2021 INDICATION: 40 year old female with right breast cancer requiring central venous access for chemotherapy. EXAM: IMPLANTED PORT A CATH PLACEMENT WITH ULTRASOUND AND FLUOROSCOPIC GUIDANCE COMPARISON:  None. MEDICATIONS: None. ANESTHESIA/SEDATION: Moderate (conscious) sedation was employed during this procedure. A total of Versed 1.5 mg and Fentanyl 50 mcg was administered intravenously. Moderate Sedation Time: 24 minutes. The patient's level of consciousness and vital signs were monitored continuously by radiology nursing throughout the procedure under my direct supervision. CONTRAST:  None FLUOROSCOPY TIME:  0 minutes, 6 seconds (0.9 mGy) COMPLICATIONS: None immediate. PROCEDURE: The procedure, risks, benefits, and alternatives were explained to the patient. Questions regarding the procedure were encouraged and  answered. The patient understands and consents to the procedure. The left neck and chest were prepped with chlorhexidine in a sterile fashion, and a sterile drape was applied covering the operative field. Maximum barrier sterile technique with sterile gowns and gloves were used for the procedure. A timeout was performed prior to the initiation of the procedure. Ultrasound was used to examine the jugular vein which was compressible and free of internal echoes. A skin marker was used to demarcate the planned venotomy and port pocket incision sites. Local anesthesia was provided to these sites and the subcutaneous tunnel track with 1% lidocaine with 1:100,000 epinephrine. A small incision was created at the jugular access site and blunt dissection was performed of the subcutaneous tissues. Under ultrasound guidance, the jugular vein was accessed with a 21 ga micropuncture needle and an 0.018" wire was inserted to the superior vena cava. Real-time ultrasound guidance was utilized for vascular access including the acquisition of a permanent ultrasound image documenting patency of the accessed vessel. A 5 Fr micopuncture set was then used, through which a 0.035" Rosen wire was passed under fluoroscopic guidance into the inferior vena cava. An 8 Fr dilator was then placed over the wire. A subcutaneous port pocket was then created along the upper chest wall utilizing a combination of sharp and blunt dissection. The pocket was irrigated with sterile saline, packed with gauze, and observed for hemorrhage. A single lumen "ISP" sized power injectable port was chosen for placement. The 8 Fr catheter was tunneled from the port pocket site to the venotomy incision. The port was placed in the pocket. The external catheter was trimmed to appropriate length. The dilator was exchanged for an 8 Fr peel-away sheath under fluoroscopic guidance. The catheter was then placed through the sheath and the sheath was removed. Final catheter  positioning was confirmed and documented with a fluoroscopic spot radiograph. The port was accessed with a Huber needle, aspirated, and flushed with heparinized saline. The deep dermal layer of the port pocket incision was closed with interrupted 3-0 Vicryl suture. The skin was opposed with a running subcuticular 4-0 Monocryl suture. Dermabond was then placed over the port pocket and neck incisions. The patient tolerated the procedure well without immediate post procedural complication. FINDINGS: After catheter placement, the tip lies within the superior cavoatrial junction. The catheter aspirates and flushes normally and is ready for immediate use. IMPRESSION: Successful placement of a power injectable Port-A-Cath via the left internal jugular vein. The catheter is ready for immediate use. Ruthann Cancer, [facility] Radiology Electronically Signed   By: Ruthann Cancer M.D.   On: 06/27/2021 10:58     ELIGIBLE FOR AVAILABLE  RESEARCH PROTOCOL: no  ASSESSMENT: 40 y.o. Spanish speaker status post right breast upper outer quadrant biopsy 04/26/2021 for a clinical mT3 N0, stage IIA invasive ductal carcinoma, grade 1 or 2, estrogen and progesterone receptor positive, HER2 not amplified, with an MIB-1 of 5%.  (1) genetics testing 05/16/2021 through the [employer] panel (report date 05/11/2021) or CancerNext-Expanded + RNAinsight panel (report date 05/16/2021).   The BRCAplus panel offered by [employer] and includes sequencing and deletion/duplication analysis for the following 8 genes: ATM, BRCA1, BRCA2, CDH1, CHEK2, PALB2, PTEN, and TP53. The CancerNext-Expanded + RNAinsight gene panel offered by [employer] and includes sequencing and rearrangement analysis for the following 77 genes: AIP, ALK, APC, ATM, AXIN2, BAP1, BARD1, BLM, BMPR1A, BRCA1, BRCA2, BRIP1, CDC73, CDH1, CDK4, CDKN1B, CDKN2A, CHEK2, CTNNA1, DICER1, FANCC, FH, FLCN, GALNT12, KIF1B, LZTR1, MAX,  MEN1, MET, MLH1, MSH2, MSH3, MSH6, MUTYH, NBN, NF1, NF2, NTHL1, PALB2, PHOX2B, PMS2, POT1, PRKAR1A, PTCH1, PTEN, RAD51C, RAD51D, RB1, RECQL, RET, SDHA, SDHAF2, SDHB, SDHC, SDHD, SMAD4, SMARCA4, SMARCB1, SMARCE1, STK11, SUFU, TMEM127, TP53, TSC1, TSC2, VHL and XRCC2 (sequencing and deletion/duplication); EGFR, EGLN1, HOXB13, KIT, MITF, PDGFRA, POLD1 and POLE (sequencing only); EPCAM and GREM1 (deletion/duplication only). RNA data is routinely analyzed for use in variant interpretation for all genes.  (A) A variant of uncertain significance (VUS) was detected in the [facility] gene called Moore.[id number] (c.4106G>A).  (2) MammaPrint obtained from the 04/26/2021 biopsy returned luminal a type, low risk, with a predicted benefit from additional chemotherapy in the 1-2% range [given the patient's young age however, the benefit of chemotherapy might be as high as 5%].  (3) status post right mastectomy and sentinel lymph node sampling 06/01/2021 for an mpT3 pN1-2, stage IIA invasive ductal carcinoma, grade 1, with negative margins  (A) a total of 2 right axillary lymph nodes were removed, both positive, 1 with extracapsular extension  (B) completion axillary dissection being considered  (3) adjuvant chemotherapy with docetaxel, cyclophosphamide given on day 1 of a 21 day cycle x 4 from 06/30/2021 through 09/01/2021  (4) adjuvant radiation  (5) tamoxifen started 05/04/2021 in anticipation of surgical delays   PLAN:  Sophia Moore returns today for f/u after receiving her second cycle of adjuvant chemotherapy with docetaxel and Cyclophosphamide.  She tolerated this cycle much better with the increased IV fluids administration.  She is slightly fatigued, but otherwise has no concerns.    Ladrea was encouarged to conitnue with her activity.  She will receive IV fluids today in our treatment room.  We discussed her appointments moving forward.  She will return in 2 weeks for labs, f/u, and cycle 3 of chemotherapy.    She  knows to call for any questions that may arise between now and her next appointment.  We are happy to see her sooner if needed.   Total encounter time: 20 minutes in face to face visit time, chart review, lab review, order entry, care coordination, and documentation of the encounter.   Wilber Bihari, NP 06/30/21 1:03 PM Medical Oncology and Hematology [facility] [place], [place] [address] Tel. [phone]    Fax. [phone]    *Total Encounter Time as defined by the Centers for Medicare and Medicaid Services includes, in addition to the face-to-face time of a patient visit (documented in the note above) non-face-to-face time: obtaining and reviewing outside history, ordering and reviewing medications, tests or procedures, care coordination (communications with other health care professionals or caregivers) and documentation in the medical record.

## 2021-07-28 ENCOUNTER — Encounter: Payer: BC Managed Care – PPO | Admitting: Rehabilitation

## 2021-08-02 ENCOUNTER — Other Ambulatory Visit: Payer: Self-pay | Admitting: Adult Health

## 2021-08-02 ENCOUNTER — Ambulatory Visit: Payer: BC Managed Care – PPO | Admitting: Rehabilitation

## 2021-08-02 ENCOUNTER — Encounter: Payer: Self-pay | Admitting: Oncology

## 2021-08-02 ENCOUNTER — Encounter: Payer: Self-pay | Admitting: Rehabilitation

## 2021-08-02 ENCOUNTER — Other Ambulatory Visit: Payer: Self-pay

## 2021-08-02 DIAGNOSIS — C50411 Malignant neoplasm of upper-outer quadrant of right female breast: Secondary | ICD-10-CM

## 2021-08-02 DIAGNOSIS — C50811 Malignant neoplasm of overlapping sites of right female breast: Secondary | ICD-10-CM | POA: Diagnosis not present

## 2021-08-02 DIAGNOSIS — M25611 Stiffness of right shoulder, not elsewhere classified: Secondary | ICD-10-CM

## 2021-08-02 DIAGNOSIS — R293 Abnormal posture: Secondary | ICD-10-CM

## 2021-08-02 DIAGNOSIS — Z483 Aftercare following surgery for neoplasm: Secondary | ICD-10-CM

## 2021-08-02 NOTE — Patient Instructions (Signed)
Access Code: [email or web address]: [email or web address]: 10/25/2022Prepared by: Marcene Brawn TevisExercises  Supine Chest Stretch with Elbows Bent - 2 x daily - 7 x weekly - 1 sets - 2 reps - 30-60seconds hold  Supine Lower Trunk Rotation - 1 x daily - 7 x weekly - 1-3 sets - 10 reps - 10 seconds hold  Doorway Pec Stretch at 90 Degrees Abduction - 2 x daily - 7 x weekly - 1 sets - 3 reps - 30-60seconds hold  Standing shoulder flexion wall slides - 1 x daily - 7 x weekly - 1-3 sets - 10 reps - 20-30 seconds hold  Standing Shoulder Abduction Slides at Wall - 1 x daily - 2-3 x weekly - 1-3 sets - 10 reps - no hold  Shoulder External Rotation and Scapular Retraction with Resistance - 1 x daily - 2-3 x weekly - 1-3 sets - 10 reps - no hold  Seated Shoulder Horizontal Abduction with Resistance - 1 x daily - 2-3 x weekly - 1-3 sets - 10 reps - no hold  Standing Row with Anchored Resistance - 1 x daily - 3-4 x weekly - 1-3 sets - 10 reps - 2-3 second hold

## 2021-08-02 NOTE — Therapy (Signed)
[facility] @ [place], [place], [address] Phone: [phone]   Fax:  [phone]  Physical Therapy Treatment  Patient Details  Name: Sophia Moore MRN: [id number] Date of Birth: 1981/09/21 Referring Provider (PT): Dr. Wyvonnia Lora   Encounter Date: 08/02/2021   PT End of Session - 08/02/21 0945     Visit Number 8    Number of Visits 10    PT Start Time 0900    PT Stop Time 0945    PT Time Calculation (min) 45 min    Activity Tolerance Patient tolerated treatment well    Behavior During Therapy [facility] for tasks assessed/performed             Past Medical History:  Diagnosis Date   Depression    takes Citalopram daily   Family history of breast cancer    Thyroid nodule 07/2016    Past Surgical History:  Procedure Laterality Date   IR IMAGING GUIDED PORT INSERTION  06/27/2021   MASTECTOMY W/ SENTINEL NODE BIOPSY Right 06/01/2021   Procedure: RIGHT MASTECTOMY WITH AXILLARY SENTINEL LYMPH NODE BIOPSY;  Surgeon: Rolm Bookbinder, MD;  Location: [facility];  Service: General;  Laterality: Right;   THYROIDECTOMY  09/13/2016   THYROIDECTOMY N/A 09/13/2016   Procedure: TOTAL THYROIDECTOMY;  Surgeon: Leta Baptist, MD;  Location: [facility];  Service: ENT;  Laterality: N/A;   wisdom teeth extracted      WISDOM TOOTH EXTRACTION      There were no vitals filed for this visit.   Subjective Assessment - 08/02/21 0905     Subjective I am not going to get reconstruction.  No more surgery.  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 She underwent a right mastectomy and sentinel node biopsy (2 of 2 positive nodes) on 06/01/2021.  It is ER/PR positive and HER2 negative with a Ki67 of 5%. She had a thyroidectomy in 2017.    Currently in Pain? No/denies                [facility] PT Assessment  - 08/02/21 0001       AROM   Right Shoulder Flexion 153 Degrees    Right Shoulder ABduction 174 Degrees    Right Shoulder External Rotation --   behind the head full   Left Shoulder Flexion 151 Degrees    Left Shoulder ABduction 174 Degrees    Left Shoulder External Rotation --   behind the head full               L-DEX FLOWSHEETS - 08/02/21 0900       L-DEX LYMPHEDEMA SCREENING   Measurement Type Unilateral    L-DEX MEASUREMENT EXTREMITY Upper Extremity    POSITION  Standing    DOMINANT SIDE Right    At Risk Side Right    BASELINE SCORE (UNILATERAL) 4.5    L-DEX SCORE (UNILATERAL) 1    VALUE CHANGE (UNILAT) -3.5                       [facility] Adult PT Treatment/Exercise - 08/02/21 0001       Exercises   Other Exercises  reviewed final HEP per instruction section with performance of band rowing only.  Emphasized importance of continuing walking and stretches throughout and after radiation.      Manual Therapy  Manual Therapy Passive ROM    Soft tissue mobilization to the Rt axilla and cording release    Passive ROM to the Rt shoulder into all planes with axillary blocking and cording work as able                          PT Long Term Goals - 08/02/21 0947       PT LONG TERM GOAL #1   Title Patient will demonstrate she has regained full shoulder ROM and function post operatively compared to bsaelines.    Status Achieved      PT LONG TERM GOAL #2   Title Patient will increase right shoulder active flexion to >/= 140 degrees for increased ease reaching overhead.    Status Achieved      PT LONG TERM GOAL #3   Title Patient will increase right shoulder active abduction to >/= 150 degrees for increased ease obtaining radiation positioning.    Status Achieved      PT LONG TERM GOAL #4   Title Patient will verbalize good understanding of lymphedema risk reduction practices.    Status Achieved                   Plan -  08/02/21 0946     Clinical Impression Statement Pt ready for Dc from PT with all goals met and return to baseline AROM.  Pt still has one small cord in the axilla but hard to palpate and not limiting.  Pt shows no signs of lymphedema on her LDex scan today and is scheduled for the next one in February.    PT Next Visit Plan 6 month [facility]ZO    Consulted and Agree with Plan of Care Patient             Patient will benefit from skilled therapeutic intervention in order to improve the following deficits and impairments:     Visit Diagnosis: Aftercare following surgery for neoplasm  Malignant neoplasm of upper-outer quadrant of right breast in female, estrogen receptor positive ([place])  Abnormal posture  Stiffness of right shoulder, not elsewhere classified     Problem List Patient Active Problem List   Diagnosis Date Noted   S/P mastectomy, right 06/01/2021   Genetic testing 05/11/2021   Family history of breast cancer 05/04/2021   Malignant neoplasm of upper-outer quadrant of right breast in female, estrogen receptor positive ([place]) 05/02/2021   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

## 2021-08-02 NOTE — Telephone Encounter (Signed)
No entry 

## 2021-08-04 ENCOUNTER — Encounter: Payer: BC Managed Care – PPO | Admitting: Rehabilitation

## 2021-08-08 ENCOUNTER — Other Ambulatory Visit: Payer: BC Managed Care – PPO

## 2021-08-08 ENCOUNTER — Ambulatory Visit: Payer: BC Managed Care – PPO | Admitting: Oncology

## 2021-08-08 ENCOUNTER — Telehealth: Payer: Self-pay | Admitting: *Deleted

## 2021-08-08 NOTE — Telephone Encounter (Signed)
"  [employer] [phone]).  Did you receive FMLA form I turned in last week?  Have been out of work since June 2022 and running out of sick time.  Do not want to loose my job.  Initial form from Dr. Donne Hazel for surgery.  Chemotherapy continues; will be followed by radiation.  Unable to work."  No FMLA form received   This nurse faxed [employer] forms [employer]A, [place] to [place] fax [phone] on 08/04/2021 and to [place], [place] fax number [phone] on forms.  [employer] mailed to address per EMR.   [facility] confirm receipt of forms with employer and ask what form is needed for continued leave.

## 2021-08-08 NOTE — Telephone Encounter (Signed)
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  Sophia Moore is in office advised send to correct fax number [phone] for review.  Thursday, HR will let me know if another form is needed."

## 2021-08-09 NOTE — Progress Notes (Signed)
Sophia Moore  Telephone:([phone]) [phone] Fax:(336) [phone]     ID: Sophia Moore DOB: 17-Jun-1981  MR#: [id number]  XTG#:[id number]  Patient Care Team: [facility] as PCP - General (Family Medicine) Mauro Kaufmann, RN as Oncology Nurse Navigator Rockwell Germany, RN as Oncology Nurse Navigator Rolm Bookbinder, MD as Consulting Physician (General Surgery) Magrinat, Virgie Dad, MD as Consulting Physician (Oncology) Eppie Gibson, MD as Attending Physician (Radiation Oncology) Scot Dock, NP OTHER MD:  CHIEF COMPLAINT: Estrogen receptor positive breast cancer  CURRENT TREATMENT: adjvuant chemotherapy    INTERVAL HISTORY: Kanaya returns today for follow up of her estrogen receptor positive breast cancer.   She is here for evaluation of cycle 3 day 1 of adjuvant chemotherapy with Docetaxel and cyclophosphamide given every 21 days with udenyca on day 3.     Sophia Moore is feeling quite well today.  She has mild fatigue. She denies any new issues.  She is not having any more post-surgical discomfort or diarrhea.  She has noted some mild darkening at the base of her nail bed.  She says that she also experienced some very mild decrease sensation in her second third and fourth digit on her right hand that occurred 1 day out of the past 21 days and lasted for only 5 minutes.  It has since resolved and she has not experienced any more discomfort similar to this.  Following her first cycle she was feeling very dehydrated and required IV fluids.  She is now scheduled for IV fluids for 5 days following her treatments.  She says that this has worked well for her.  Sophia Moore has tried to remain active.  She is doing well with this and even notes that she walked in from the far end of the parking lot today to stay active.  REVIEW OF SYSTEMS: Review of Systems  Constitutional:  Positive for fatigue. Negative for appetite change, chills, fever  and unexpected weight change.  HENT:   Negative for hearing loss, lump/mass, mouth sores and trouble swallowing.   Eyes:  Negative for eye problems and icterus.  Respiratory:  Negative for chest tightness, cough and shortness of breath.   Cardiovascular:  Negative for chest pain, leg swelling and palpitations.  Gastrointestinal:  Negative for abdominal distention, abdominal pain, constipation, diarrhea, nausea and vomiting.  Endocrine: Negative for hot flashes.  Genitourinary:  Negative for difficulty urinating.   Musculoskeletal:  Negative for arthralgias.  Skin:  Negative for itching and rash.  Neurological:  Negative for dizziness, extremity weakness, headaches and numbness.  Hematological:  Negative for adenopathy. Does not bruise/bleed easily.  Psychiatric/Behavioral:  Negative for depression. The patient is not nervous/anxious.     COVID 19 VACCINATION STATUS: not vaccinated, infection 05/2019 (results in [place])   HISTORY OF CURRENT ILLNESS: From the original intake note:  [name] had routine screening mammography (her first ever) on 03/22/2021 showing a possible abnormality in the right breast. She underwent right diagnostic mammography with tomography and right breast ultrasonography at [facility] on 04/19/2021 showing: breast density category C; palpable 6.3 cm mass involving the central and upper half of the right breast; 2-4 satellite nodules within upper-outer quadrant; no right axillary adenopathy.  Accordingly on 04/26/2021 she proceeded to biopsy of the right breast area in question. 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 Proliferation marker Ki67 at 5%. HER2  equivocal by immunohistochemistry (2+), but negative by fluorescent in situ hybridization with a signals ratio 1.2 and number per cell 1.8. 2. Right Breast, upper-outer axillary tail  - flat epithelial atypia with calcifications  Cancer Staging Malignant neoplasm of upper-outer quadrant of right breast in female, estrogen receptor positive ([place]) Staging form: Breast, AJCC 8th Edition - Clinical stage from 05/04/2021: Stage IIA (cT3, cN0, cM0, G2, ER+, PR+, HER2-) - Signed by Chauncey Cruel, MD on 05/04/2021 Stage prefix: Initial diagnosis Histologic grading system: 3 grade system Laterality: Right Staged by: Pathologist and managing physician Stage used in treatment planning: Yes National guidelines used in treatment planning: Yes Type of national guideline used in treatment planning: NCCN  The patient's subsequent history is as detailed below.   PAST MEDICAL HISTORY: Past Medical History:  Diagnosis Date   Depression    takes Citalopram daily   Family history of breast cancer    Thyroid nodule 07/2016    PAST SURGICAL HISTORY: Past Surgical History:  Procedure Laterality Date   IR IMAGING GUIDED PORT INSERTION  06/27/2021   MASTECTOMY W/ SENTINEL NODE BIOPSY Right 06/01/2021   Procedure: RIGHT MASTECTOMY WITH AXILLARY SENTINEL LYMPH NODE BIOPSY;  Surgeon: Rolm Bookbinder, MD;  Location: [facility];  Service: General;  Laterality: Right;   THYROIDECTOMY  09/13/2016   THYROIDECTOMY N/A 09/13/2016   Procedure: TOTAL THYROIDECTOMY;  Surgeon: Leta Baptist, MD;  Location: [facility];  Service: ENT;  Laterality: N/A;   wisdom teeth extracted      WISDOM TOOTH EXTRACTION      FAMILY HISTORY: Family History  Problem Relation Age of Onset   Hyperlipidemia Mother    Stroke Mother    Breast cancer Maternal Aunt        dx 28s   Her parents are both living, her father age 56 and her mother age 48, as of 04/2021. Sophia Moore has two brothers and three sisters. She reports breast  cancer in a maternal aunt (age 55+).   GYNECOLOGIC HISTORY:  Patient's last menstrual period was 06/02/2021 (exact date). Menarche: unsure Age at first live birth: 40 years old [place] P 3 LMP 04/13/2021 Contraceptive never used HRT n/a  Hysterectomy? no BSO? 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  She is originally from the [place] section of [place].  Husband Efren work in Architect. She lives at home with daughter [name], age 55, and son [name], age 5. Her oldest son [name], age 2, works at [employer]. She attends a [employer].    ADVANCED DIRECTIVES: In the absence of any documentation to the contrary, the patient's spouse is their HCPOA.    HEALTH MAINTENANCE: Social History   Tobacco Use   Smoking status: Never   Smokeless tobacco: Never  Vaping Use   Vaping Use: Never used  Substance Use Topics   Alcohol use: No   Drug use: No     Colonoscopy: n/a (age)  PAP: 03/2021  Bone density: n/a (age)   Allergies  Allergen Reactions   Amoxicillin Rash   Penicillins Rash    Has patient had a PCN reaction causing immediate rash, facial/tongue/throat swelling, SOB or lightheadedness with hypotension: Yes Has patient had a PCN reaction causing severe rash involving mucus membranes or skin  necrosis: No Has patient had a PCN reaction that required hospitalization No Has patient had a PCN reaction occurring within the last 10 years: Yes If all of the above answers are "NO", then may proceed with Cephalosporin use.     Current Outpatient Medications  Medication Sig Dispense Refill   dexamethasone (DECADRON) 4 MG tablet Take 2 tablets (8 mg total) by mouth 2 (two) times daily. Start the day before Taxotere. Then again the day after chemo for 3 days. 30 tablet 1   levothyroxine (SYNTHROID) 88 MCG tablet TAKE 1 TABLET BY MOUTH EVERY DAY BEFORE BREAKFAST 90  tablet 1   lidocaine-prilocaine (EMLA) cream Apply to affected area once 30 g 3   methocarbamol (ROBAXIN) 750 MG tablet Take 1 tablet (750 mg total) by mouth 4 (four) times daily as needed (use for muscle cramps/pain). 30 tablet 0   oxyCODONE (OXY IR/ROXICODONE) 5 MG immediate release tablet Take 1 tablet (5 mg total) by mouth every 6 (six) hours as needed. 10 tablet 0   prochlorperazine (COMPAZINE) 10 MG tablet Take 1 tablet (10 mg total) by mouth every 6 (six) hours as needed (Nausea or vomiting). 30 tablet 1   No current facility-administered medications for this visit.    OBJECTIVE: Spanish speaker who appears stated age  79:   06/30/21 1253  BP: 117/72  Pulse: 70  Resp: 18  Temp: 97.7 F (36.5 C)  SpO2: 100%     Body mass index is 21.69 kg/m.   Wt Readings from Last 3 Encounters:  06/30/21 114 lb 12.8 oz (52.1 kg)  06/27/21 117 lb (53.1 kg)  06/20/21 117 lb 3.2 oz (53.2 kg)  ECOG FS:1 - Symptomatic but completely ambulatory GENERAL: Patient is a well appearing female in no acute distress HEENT:  Sclerae anicteric.  Mask in place. Neck is supple.  NODES:  No cervical, supraclavicular, or axillary lymphadenopathy palpated.  BREAST EXAM:  right breast s/p mastectomy, well healed, no area of concern LUNGS:  Clear to auscultation bilaterally.  No wheezes or rhonchi. HEART:  Regular rate and rhythm. No murmur appreciated. ABDOMEN:  Soft, nontender.  Positive, normoactive bowel sounds. No organomegaly palpated. MSK:  No focal spinal tenderness to palpation. Full range of motion bilaterally in the upper extremities. EXTREMITIES:  No peripheral edema.   SKIN:  Clear with no obvious rashes or skin changes. Darkening at the fingernail base of nail bed on all fingers.   NEURO:  Nonfocal. Well oriented.  Appropriate affect.    LAB RESULTS:  CMP     Component Value Date/Time   NA 141 05/04/2021 0832   K 3.9 05/04/2021 0832   CL 107 05/04/2021 0832   CO2 27 05/04/2021 0832    GLUCOSE 86 05/04/2021 0832   BUN 13 05/04/2021 0832   CREATININE 0.77 05/04/2021 0832   CALCIUM 9.1 05/04/2021 0832   PROT 6.8 05/04/2021 0832   ALBUMIN 3.6 05/04/2021 0832   AST 16 05/04/2021 0832   ALT 7 05/04/2021 0832   ALKPHOS 53 05/04/2021 0832   BILITOT 0.7 05/04/2021 0832   GFRNONAA >60 05/04/2021 0832    No results found for: TOTALPROTELP, ALBUMINELP, A1GS, A2GS, BETS, BETA2SER, GAMS, MSPIKE, SPEI  Lab Results  Component Value Date   WBC 6.4 06/30/2021   NEUTROABS 4.6 06/30/2021   HGB 14.0 06/30/2021   HCT 40.5 06/30/2021   MCV 85.4 06/30/2021   PLT 234 06/30/2021    No results found for: LABCA2  No components found for: LZJQBH419  No  results for input(s): INR in the last 168 hours.  No results found for: LABCA2  No results found for: BWG665  No results found for: LDJ570  No results found for: VXB939  No results found for: CA2729  No components found for: HGQUANT  No results found for: CEA1 / No results found for: CEA1   No results found for: AFPTUMOR  No results found for: CHROMOGRNA  No results found for: KPAFRELGTCHN, LAMBDASER, KAPLAMBRATIO (kappa/lambda light chains)  No results found for: HGBA, HGBA2QUANT, HGBFQUANT, HGBSQUAN (Hemoglobinopathy evaluation)   No results found for: LDH  No results found for: IRON, TIBC, IRONPCTSAT (Iron and TIBC)  No results found for: FERRITIN  Urinalysis No results found for: COLORURINE, APPEARANCEUR, LABSPEC, PHURINE, GLUCOSEU, HGBUR, BILIRUBINUR, KETONESUR, PROTEINUR, UROBILINOGEN, NITRITE, LEUKOCYTESUR   STUDIES: NM Sentinel Node Inj-No Rpt (Breast)  Result Date: 06/01/2021 Sulfur Colloid was injected by the Nuclear Medicine Technologist for sentinel lymph node localization.   IR IMAGING GUIDED PORT INSERTION  Result Date: 06/27/2021 INDICATION: 40 year old female with right breast cancer requiring central venous access for chemotherapy. EXAM: IMPLANTED PORT A CATH PLACEMENT WITH ULTRASOUND  AND FLUOROSCOPIC GUIDANCE COMPARISON:  None. MEDICATIONS: None. ANESTHESIA/SEDATION: Moderate (conscious) sedation was employed during this procedure. A total of Versed 1.5 mg and Fentanyl 50 mcg was administered intravenously. Moderate Sedation Time: 24 minutes. The patient's level of consciousness and vital signs were monitored continuously by radiology nursing throughout the procedure under my direct supervision. CONTRAST:  None FLUOROSCOPY TIME:  0 minutes, 6 seconds (0.9 mGy) COMPLICATIONS: None immediate. PROCEDURE: The procedure, risks, benefits, and alternatives were explained to the patient. Questions regarding the procedure were encouraged and answered. The patient understands and consents to the procedure. The left neck and chest were prepped with chlorhexidine in a sterile fashion, and a sterile drape was applied covering the operative field. Maximum barrier sterile technique with sterile gowns and gloves were used for the procedure. A timeout was performed prior to the initiation of the procedure. Ultrasound was used to examine the jugular vein which was compressible and free of internal echoes. A skin marker was used to demarcate the planned venotomy and port pocket incision sites. Local anesthesia was provided to these sites and the subcutaneous tunnel track with 1% lidocaine with 1:100,000 epinephrine. A small incision was created at the jugular access site and blunt dissection was performed of the subcutaneous tissues. Under ultrasound guidance, the jugular vein was accessed with a 21 ga micropuncture needle and an 0.018" wire was inserted to the superior vena cava. Real-time ultrasound guidance was utilized for vascular access including the acquisition of a permanent ultrasound image documenting patency of the accessed vessel. A 5 Fr micopuncture set was then used, through which a 0.035" Rosen wire was passed under fluoroscopic guidance into the inferior vena cava. An 8 Fr dilator was then placed  over the wire. A subcutaneous port pocket was then created along the upper chest wall utilizing a combination of sharp and blunt dissection. The pocket was irrigated with sterile saline, packed with gauze, and observed for hemorrhage. A single lumen "ISP" sized power injectable port was chosen for placement. The 8 Fr catheter was tunneled from the port pocket site to the venotomy incision. The port was placed in the pocket. The external catheter was trimmed to appropriate length. The dilator was exchanged for an 8 Fr peel-away sheath under fluoroscopic guidance. The catheter was then placed through the sheath and the sheath was removed. Final catheter positioning was confirmed and documented  with a fluoroscopic spot radiograph. The port was accessed with a Huber needle, aspirated, and flushed with heparinized saline. The deep dermal layer of the port pocket incision was closed with interrupted 3-0 Vicryl suture. The skin was opposed with a running subcuticular 4-0 Monocryl suture. Dermabond was then placed over the port pocket and neck incisions. The patient tolerated the procedure well without immediate post procedural complication. FINDINGS: After catheter placement, the tip lies within the superior cavoatrial junction. The catheter aspirates and flushes normally and is ready for immediate use. IMPRESSION: Successful placement of a power injectable Port-A-Cath via the left internal jugular vein. The catheter is ready for immediate use. Ruthann Cancer, MD [facility] Electronically Signed   By: Ruthann Cancer M.D.   On: 06/27/2021 10:58     ELIGIBLE FOR AVAILABLE RESEARCH PROTOCOL: no  ASSESSMENT: 40 y.o. Spanish speaker status post right breast upper outer quadrant biopsy 04/26/2021 for a clinical mT3 N0, stage IIA invasive ductal carcinoma, grade 1 or 2, estrogen and progesterone receptor positive, HER2 not amplified, with an MIB-1 of 5%.  (1)  genetics testing 05/16/2021 through the [employer] panel (report date 05/11/2021) or CancerNext-Expanded + RNAinsight panel (report date 05/16/2021).   The BRCAplus panel offered by [employer] and includes sequencing and deletion/duplication analysis for the following 8 genes: ATM, BRCA1, BRCA2, CDH1, CHEK2, PALB2, PTEN, and TP53. The CancerNext-Expanded + RNAinsight gene panel offered by [employer] and includes sequencing and rearrangement analysis for the following 77 genes: AIP, ALK, APC, ATM, AXIN2, BAP1, BARD1, BLM, BMPR1A, BRCA1, BRCA2, BRIP1, CDC73, CDH1, CDK4, CDKN1B, CDKN2A, CHEK2, CTNNA1, DICER1, FANCC, FH, FLCN, GALNT12, KIF1B, LZTR1, MAX, MEN1, MET, MLH1, MSH2, MSH3, MSH6, MUTYH, NBN, NF1, NF2, NTHL1, PALB2, PHOX2B, PMS2, POT1, PRKAR1A, PTCH1, PTEN, RAD51C, RAD51D, RB1, RECQL, RET, SDHA, SDHAF2, SDHB, SDHC, SDHD, SMAD4, SMARCA4, SMARCB1, SMARCE1, STK11, SUFU, TMEM127, TP53, TSC1, TSC2, VHL and XRCC2 (sequencing and deletion/duplication); EGFR, EGLN1, HOXB13, KIT, MITF, PDGFRA, POLD1 and POLE (sequencing only); EPCAM and GREM1 (deletion/duplication only). RNA data is routinely analyzed for use in variant interpretation for all genes.  (A) A variant of uncertain significance (VUS) was detected in the [facility] gene called p.R1369H (c.4106G>A).  (2) MammaPrint obtained from the 04/26/2021 biopsy returned luminal a type, low risk, with a predicted benefit from additional chemotherapy in the 1-2% range [given the patient's young age however, the benefit of chemotherapy might be as high as 5%].  (3) status post right mastectomy and sentinel lymph node sampling 06/01/2021 for an mpT3 pN1-2, stage IIA invasive ductal carcinoma, grade 1, with negative margins  (A) a total of 2 right axillary lymph nodes were removed, both positive, 1 with extracapsular extension  (B) completion axillary dissection being considered  (3) adjuvant chemotherapy with docetaxel, cyclophosphamide given on day 1 of a 21  day cycle x 4 from 06/30/2021 through 09/01/2021  (4) adjuvant radiation  (5) tamoxifen started 05/04/2021 in anticipation of surgical delays   PLAN:  Alicha is here today for evaluation prior to receiving her third cycle of adjuvant chemotherapy with docetaxel and cyclophosphamide.  She is tolerating this well and will continue this.  Her CBC is stable.  We are awaiting her c-Met and so long as that is within parameters she will proceed with treatment.  She is not having any more diarrhea.  She is not taking the colestipol or the Robaxin.  Therefore I will remove the colestipol and Robaxin from her medication list.  She will proceed with  IV fluids for 5 days following her treatments.  She knows that if she feels like she does not need them on any particular day that she can cancel that appointment.  I gave [place] reassurance about her nail dyscrasia.  I reviewed with her that her nails will grow out and this would go away after she completes her chemo.  We also discussed the one episode of peripheral neuropathy.  This is a positive sign that it resolved after just a few minutes.  Asked that she remain aware of this and keep track of the type of sensation in the duration so we can monitor it closely with her next appointment.  Sussan will return following her treatment for 5 days for IV fluids.  In addition we will see her back on November 23 for labs follow-up with Dr. Jana Hakim and her fourth and final cycle of adjuvant chemotherapy.  She knows to call in the meantime for any questions or concerns that may arise as we can always see her sooner if needed.  Total encounter time: 20 minutes in face to face visit time, chart review, lab review, order entry, care coordination, and documentation of the encounter.   Wilber Bihari, NP 06/30/21 1:03 PM Medical Oncology and Hematology [facility] [place], [place] [address] Tel. [phone]    Fax.  [phone]    *Total Encounter Time as defined by the Centers for Medicare and Medicaid Services includes, in addition to the face-to-face time of a patient visit (documented in the note above) non-face-to-face time: obtaining and reviewing outside history, ordering and reviewing medications, tests or procedures, care coordination (communications with other health care professionals or caregivers) and documentation in the medical record.

## 2021-08-10 ENCOUNTER — Encounter: Payer: Self-pay | Admitting: Adult Health

## 2021-08-10 ENCOUNTER — Inpatient Hospital Stay (HOSPITAL_BASED_OUTPATIENT_CLINIC_OR_DEPARTMENT_OTHER): Payer: BC Managed Care – PPO | Admitting: Adult Health

## 2021-08-10 ENCOUNTER — Inpatient Hospital Stay: Payer: BC Managed Care – PPO

## 2021-08-10 ENCOUNTER — Other Ambulatory Visit: Payer: Self-pay

## 2021-08-10 ENCOUNTER — Encounter: Payer: Self-pay | Admitting: Oncology

## 2021-08-10 ENCOUNTER — Inpatient Hospital Stay: Payer: BC Managed Care – PPO | Attending: Oncology

## 2021-08-10 VITALS — BP 101/60 | HR 70 | Resp 18 | Ht 61.0 in | Wt 117.0 lb

## 2021-08-10 DIAGNOSIS — Z5111 Encounter for antineoplastic chemotherapy: Secondary | ICD-10-CM | POA: Insufficient documentation

## 2021-08-10 DIAGNOSIS — E860 Dehydration: Secondary | ICD-10-CM | POA: Diagnosis not present

## 2021-08-10 DIAGNOSIS — Z9011 Acquired absence of right breast and nipple: Secondary | ICD-10-CM

## 2021-08-10 DIAGNOSIS — Z79899 Other long term (current) drug therapy: Secondary | ICD-10-CM | POA: Diagnosis not present

## 2021-08-10 DIAGNOSIS — G620 Drug-induced polyneuropathy: Secondary | ICD-10-CM | POA: Insufficient documentation

## 2021-08-10 DIAGNOSIS — C50411 Malignant neoplasm of upper-outer quadrant of right female breast: Secondary | ICD-10-CM | POA: Diagnosis present

## 2021-08-10 DIAGNOSIS — Z170 Estrogen receptor positive status [ER+]: Secondary | ICD-10-CM | POA: Diagnosis not present

## 2021-08-10 DIAGNOSIS — Z95828 Presence of other vascular implants and grafts: Secondary | ICD-10-CM

## 2021-08-10 DIAGNOSIS — Z5189 Encounter for other specified aftercare: Secondary | ICD-10-CM | POA: Insufficient documentation

## 2021-08-10 DIAGNOSIS — T451X5A Adverse effect of antineoplastic and immunosuppressive drugs, initial encounter: Secondary | ICD-10-CM | POA: Insufficient documentation

## 2021-08-10 DIAGNOSIS — R5383 Other fatigue: Secondary | ICD-10-CM | POA: Diagnosis not present

## 2021-08-10 LAB — COMPREHENSIVE METABOLIC PANEL
ALT: 76 U/L — ABNORMAL HIGH (ref 0–44)
AST: 37 U/L (ref 15–41)
Albumin: 3.7 g/dL (ref 3.5–5.0)
Alkaline Phosphatase: 88 U/L (ref 38–126)
Anion gap: 11 (ref 5–15)
BUN: 15 mg/dL (ref 6–20)
CO2: 23 mmol/L (ref 22–32)
Calcium: 9.5 mg/dL (ref 8.9–10.3)
Chloride: 107 mmol/L (ref 98–111)
Creatinine, Ser: 0.69 mg/dL (ref 0.44–1.00)
GFR, Estimated: >60 mL/min (ref >60)
Glucose, Bld: 113 mg/dL — ABNORMAL HIGH (ref 70–99)
Potassium: 4 mmol/L (ref 3.5–5.1)
Sodium: 141 mmol/L (ref 135–145)
Total Bilirubin: 0.4 mg/dL (ref 0.3–1.2)
Total Protein: 6.9 g/dL (ref 6.5–8.1)

## 2021-08-10 LAB — PREGNANCY, URINE: Preg Test, Ur: NEGATIVE

## 2021-08-10 LAB — CBC WITH DIFFERENTIAL/PLATELET
Abs Immature Granulocytes: 0.08 10*3/uL — ABNORMAL HIGH (ref 0.00–0.07)
Basophils Absolute: 0 10*3/uL (ref 0.0–0.1)
Basophils Relative: 0 %
Eosinophils Absolute: 0 10*3/uL (ref 0.0–0.5)
Eosinophils Relative: 0 %
HCT: 36.6 % (ref 36.0–46.0)
Hemoglobin: 12.7 g/dL (ref 12.0–15.0)
Immature Granulocytes: 1 %
Lymphocytes Relative: 9 %
Lymphs Abs: 1 10*3/uL (ref 0.7–4.0)
MCH: 30 pg (ref 26.0–34.0)
MCHC: 34.7 g/dL (ref 30.0–36.0)
MCV: 86.3 fL (ref 80.0–100.0)
Monocytes Absolute: 0.5 10*3/uL (ref 0.1–1.0)
Monocytes Relative: 5 %
Neutro Abs: 9.6 10*3/uL — ABNORMAL HIGH (ref 1.7–7.7)
Neutrophils Relative %: 85 %
Platelets: 301 10*3/uL (ref 150–400)
RBC: 4.24 MIL/uL (ref 3.87–5.11)
RDW: 14.3 % (ref 11.5–15.5)
WBC: 11.3 10*3/uL — ABNORMAL HIGH (ref 4.0–10.5)
nRBC: 0 % (ref 0.0–0.2)

## 2021-08-10 MED ORDER — PALONOSETRON HCL INJECTION 0.25 MG/5ML
0.2500 mg | Freq: Once | INTRAVENOUS | Status: AC
  Administered 2021-08-10: 0.25 mg via INTRAVENOUS
  Filled 2021-08-10: qty 5

## 2021-08-10 MED ORDER — SODIUM CHLORIDE 0.9 % IV SOLN
600.0000 mg/m2 | Freq: Once | INTRAVENOUS | Status: AC
  Administered 2021-08-10: 920 mg via INTRAVENOUS
  Filled 2021-08-10: qty 46

## 2021-08-10 MED ORDER — SODIUM CHLORIDE 0.9 % IV SOLN
10.0000 mg | Freq: Once | INTRAVENOUS | Status: AC
  Administered 2021-08-10: 10 mg via INTRAVENOUS
  Filled 2021-08-10: qty 10

## 2021-08-10 MED ORDER — SODIUM CHLORIDE 0.9 % IV SOLN: Freq: Once | INTRAVENOUS | Status: AC

## 2021-08-10 MED ORDER — HEPARIN SOD (PORK) LOCK FLUSH 100 UNIT/ML IV SOLN
500.0000 [IU] | Freq: Once | INTRAVENOUS | Status: AC | PRN
  Administered 2021-08-10: 500 [IU]

## 2021-08-10 MED ORDER — SODIUM CHLORIDE 0.9% FLUSH
10.0000 mL | INTRAVENOUS | Status: DC | PRN
  Administered 2021-08-10: 10 mL via INTRAVENOUS

## 2021-08-10 MED ORDER — SODIUM CHLORIDE 0.9 % IV SOLN
75.0000 mg/m2 | Freq: Once | INTRAVENOUS | Status: AC
  Administered 2021-08-10: 110 mg via INTRAVENOUS
  Filled 2021-08-10: qty 11

## 2021-08-10 MED ORDER — SODIUM CHLORIDE 0.9% FLUSH
10.0000 mL | INTRAVENOUS | Status: DC | PRN
  Administered 2021-08-10: 10 mL

## 2021-08-10 NOTE — Patient Instructions (Signed)
[facility]  Discharge Instructions: Thank you for choosing [facility] to provide your oncology and hematology care.   If you have a lab appointment with the [facility], please go directly to the [facility] and check in at the registration area.   Wear comfortable clothing and clothing appropriate for easy access to any Portacath or PICC line.   We strive to give you quality time with your provider. You may need to reschedule your appointment if you arrive late (15 or more minutes).  Arriving late affects you and other patients whose appointments are after yours.  Also, if you miss three or more appointments without notifying the office, you may be dismissed from the clinic at the provider's discretion.      For prescription refill requests, have your pharmacy contact our office and allow 72 hours for refills to be completed.    Today you received the following chemotherapy and/or immunotherapy agents: Docetaxel & Cytoxan   To help prevent nausea and vomiting after your treatment, we encourage you to take your nausea medication as directed.  BELOW ARE SYMPTOMS THAT SHOULD BE REPORTED IMMEDIATELY: *FEVER GREATER THAN 100.4 F (38 C) OR HIGHER *CHILLS OR SWEATING *NAUSEA AND VOMITING THAT IS NOT CONTROLLED WITH YOUR NAUSEA MEDICATION *UNUSUAL SHORTNESS OF BREATH *UNUSUAL BRUISING OR BLEEDING *URINARY PROBLEMS (pain or burning when urinating, or frequent urination) *BOWEL PROBLEMS (unusual diarrhea, constipation, pain near the anus) TENDERNESS IN MOUTH AND THROAT WITH OR WITHOUT PRESENCE OF ULCERS (sore throat, sores in mouth, or a toothache) UNUSUAL RASH, SWELLING OR PAIN  UNUSUAL VAGINAL DISCHARGE OR ITCHING   Items with * indicate a potential emergency and should be followed up as soon as possible or go to the Emergency Department if any problems should occur.  Please show the CHEMOTHERAPY ALERT CARD or IMMUNOTHERAPY ALERT CARD at  check-in to the Emergency Department and triage nurse.  Should you have questions after your visit or need to cancel or reschedule your appointment, please contact [facility]  Dept: [phone]  and follow the prompts.  Office hours are 8:00 a.m. to 4:30 p.m. Monday - Friday. Please note that voicemails left after 4:00 p.m. may not be returned until the following business day.  We are closed weekends and major holidays. You have access to a nurse at all times for urgent questions. Please call the main number to the clinic Dept: [phone] and follow the prompts.   For any non-urgent questions, you may also contact your provider using MyChart. We now offer e-Visits for anyone 87 and older to request care online for non-urgent symptoms. For details visit [email or web address].   Also download the MyChart app! Go to the app store, search "MyChart", open the app, select [facility], and log in with your MyChart username and password.  Due to Covid, a mask is required upon entering the hospital/clinic. If you do not have a mask, one will be given to you upon arrival. For doctor visits, patients may have 1 support person aged 42 or older with them. For treatment visits, patients cannot have anyone with them due to current Covid guidelines and our immunocompromised population.

## 2021-08-11 ENCOUNTER — Other Ambulatory Visit: Payer: BC Managed Care – PPO

## 2021-08-11 ENCOUNTER — Ambulatory Visit: Payer: BC Managed Care – PPO

## 2021-08-11 ENCOUNTER — Ambulatory Visit: Payer: BC Managed Care – PPO | Admitting: Adult Health

## 2021-08-12 ENCOUNTER — Inpatient Hospital Stay: Payer: BC Managed Care – PPO

## 2021-08-12 ENCOUNTER — Other Ambulatory Visit: Payer: Self-pay

## 2021-08-12 VITALS — BP 103/69 | HR 60 | Temp 98.0°F | Resp 18

## 2021-08-12 DIAGNOSIS — C50411 Malignant neoplasm of upper-outer quadrant of right female breast: Secondary | ICD-10-CM | POA: Diagnosis not present

## 2021-08-12 DIAGNOSIS — Z170 Estrogen receptor positive status [ER+]: Secondary | ICD-10-CM

## 2021-08-12 MED ORDER — PEGFILGRASTIM-CBQV 6 MG/0.6ML ~~LOC~~ SOSY
6.0000 mg | PREFILLED_SYRINGE | Freq: Once | SUBCUTANEOUS | Status: AC
  Administered 2021-08-12: 6 mg via SUBCUTANEOUS
  Filled 2021-08-12: qty 0.6

## 2021-08-12 MED ORDER — SODIUM CHLORIDE 0.9% FLUSH
10.0000 mL | Freq: Once | INTRAVENOUS | Status: AC
  Administered 2021-08-12: 10 mL via INTRAVENOUS

## 2021-08-12 MED ORDER — SODIUM CHLORIDE 0.9 % IV SOLN: Freq: Once | INTRAVENOUS | Status: AC

## 2021-08-12 MED ORDER — HEPARIN SOD (PORK) LOCK FLUSH 100 UNIT/ML IV SOLN
500.0000 [IU] | Freq: Once | INTRAVENOUS | Status: AC
  Administered 2021-08-12: 500 [IU] via INTRAVENOUS

## 2021-08-12 NOTE — Patient Instructions (Signed)

## 2021-08-12 NOTE — Progress Notes (Signed)
When accessing Pt's PAC today Pt requested to stay accessed for IVFs tomorrow (01/11/2021).  Biodisc was placed and at home dressing was used.

## 2021-08-13 ENCOUNTER — Inpatient Hospital Stay: Payer: BC Managed Care – PPO

## 2021-08-13 ENCOUNTER — Other Ambulatory Visit: Payer: Self-pay

## 2021-08-13 VITALS — BP 107/73 | HR 58 | Temp 98.0°F | Resp 18

## 2021-08-13 DIAGNOSIS — C50411 Malignant neoplasm of upper-outer quadrant of right female breast: Secondary | ICD-10-CM | POA: Diagnosis not present

## 2021-08-13 MED ORDER — SODIUM CHLORIDE 0.9 % IV SOLN: Freq: Once | INTRAVENOUS | Status: AC

## 2021-08-13 NOTE — Patient Instructions (Signed)
Dieta de lquidos transparentes en los adultos Clear Liquid Diet, Adult Una dieta de lquidos transparentes es la que incluye nicamente lquidos y semilquidos a travs de los cuales se puede ver. En esta dieta no se come ningn alimento slido. [place] [employer] de las personas solo deben seguir esta dieta durante un perodo breve. Es posible que una persona deba seguir una dieta de lquidos transparentes en los siguientes casos: Si contrae una enfermedad justo antes o despus de haber tenido [place]. Si no puede ingerir comida durante un perodo prolongado de [place]. Si tuvo nuseas, vmitos o diarrea. Si va a someterse a un procedimiento o un examen para examinar partes del aparato digestivo. Si le realizarn una ciruga de los intestinos. Cules son los objetivos de esta dieta? Los [employer] habituales de esta dieta son los siguientes: Catering manager y el aparato digestivo cuanto sea posible. Ayudar a limpiar el aparato digestivo antes de un procedimiento o examen. Mantenerlo hidratado. Asegurarse de que ingiera caloras para Social research officer, government. Ayudarlo a volver a su rutina normal de alimentacin. Cules son algunos consejos para seguir este plan? Un lquido transparente se considera cualquier lquido o semilquido, como la gelatina, a travs del cual se puede ver cuando se lo sostiene a Building control surveyor. Una dieta de lquidos transparentes no proporciona todos los nutrientes que usted necesita. Es importante que elija varios lquidos o semilquidos que estn permitidos en esta dieta. De esta manera, obtendr la mayor cantidad de nutrientes que sea posible. Si no est seguro de si puede o no consumir ciertos lquidos, pregntele al mdico. Si tiene dificultad para tragar un lquido, deber espesarlo para evitar inhalarlo (aspiracin). Qu alimentos debo comer?  Grayce Sessions y [place] saborizada. Jugos de frutas sin pulpa; por ejemplo, como jugo de [place], de [place]. T y caf sin  leche ni crema. Caldo o consom transparentes. Sopas a base de caldo que hayan sido coladas. Gelatina saborizada. Miel. Agua azucarada. Hielo o helados de agua que no contengan [place], Estate agent, trozos de frutas ni pulpa de frutas. Refrescos transparentes. Bebidas deportivas transparentes. Es posible que los productos que se enumeran ms [place] no constituyan una lista completa de los alimentos y las bebidas que puede tomar. Consulte a un nutricionista para obtener ms informacin. Qu alimentos debo evitar? Jugos con pulpa. Leche. [place]op[place] en crema o a base de crema. Yogur. Alimentos slidos que no sean lquidos ni semilquidos transparentes. Es posible que los productos que se enumeran ms [place] no constituyan una lista completa de los alimentos y las bebidas que Nurse, adult. Consulte a un nutricionista para obtener ms informacin. Preguntas para hacerle al mdico: Cunto tiempo debo seguir esta dieta? Hay algn medicamento que deba cambiar mientras sigo esta dieta? Resumen Una dieta de lquidos transparentes es la que incluye lquidos y semilquidos a travs de los cuales se puede ver. [place] de esta dieta es ayudar a que usted se recupere haciendo descansar el aparato digestivo, mantenindolo hidratado y proveyndole nutrientes. No beba lquidos que [employer], crema o pulpa mientras sigue esta dieta. Esta informacin no tiene Marine scientist el consejo del mdico. Asegrese de hacerle al mdico cualquier pregunta que tenga. Document Revised: 07/22/2020 Document Reviewed: 07/22/2020 Elsevier Patient Education  2022 [employer].

## 2021-08-15 ENCOUNTER — Other Ambulatory Visit: Payer: Self-pay

## 2021-08-15 ENCOUNTER — Inpatient Hospital Stay: Payer: BC Managed Care – PPO

## 2021-08-15 VITALS — BP 97/64 | HR 77 | Temp 98.4°F | Resp 16

## 2021-08-15 DIAGNOSIS — Z170 Estrogen receptor positive status [ER+]: Secondary | ICD-10-CM

## 2021-08-15 DIAGNOSIS — C50411 Malignant neoplasm of upper-outer quadrant of right female breast: Secondary | ICD-10-CM | POA: Diagnosis not present

## 2021-08-15 MED ORDER — SODIUM CHLORIDE 0.9 % IV SOLN: Freq: Once | INTRAVENOUS | Status: AC

## 2021-08-15 NOTE — Patient Instructions (Signed)

## 2021-08-16 ENCOUNTER — Other Ambulatory Visit: Payer: Self-pay | Admitting: *Deleted

## 2021-08-16 ENCOUNTER — Inpatient Hospital Stay: Payer: BC Managed Care – PPO

## 2021-08-16 VITALS — BP 107/69 | HR 95 | Temp 98.5°F | Resp 18 | Ht 61.0 in | Wt 119.8 lb

## 2021-08-16 DIAGNOSIS — C50411 Malignant neoplasm of upper-outer quadrant of right female breast: Secondary | ICD-10-CM

## 2021-08-16 DIAGNOSIS — Z170 Estrogen receptor positive status [ER+]: Secondary | ICD-10-CM

## 2021-08-16 DIAGNOSIS — Z95828 Presence of other vascular implants and grafts: Secondary | ICD-10-CM

## 2021-08-16 LAB — CBC WITH DIFFERENTIAL (CANCER CENTER ONLY)
Abs Immature Granulocytes: 0.05 10*3/uL (ref 0.00–0.07)
Basophils Absolute: 0 10*3/uL (ref 0.0–0.1)
Basophils Relative: 1 %
Eosinophils Absolute: 0 10*3/uL (ref 0.0–0.5)
Eosinophils Relative: 1 %
HCT: 30.9 % — ABNORMAL LOW (ref 36.0–46.0)
Hemoglobin: 10.8 g/dL — ABNORMAL LOW (ref 12.0–15.0)
Immature Granulocytes: 4 %
Lymphocytes Relative: 37 %
Lymphs Abs: 0.5 10*3/uL — ABNORMAL LOW (ref 0.7–4.0)
MCH: 30.4 pg (ref 26.0–34.0)
MCHC: 35 g/dL (ref 30.0–36.0)
MCV: 87 fL (ref 80.0–100.0)
Monocytes Absolute: 0.2 10*3/uL (ref 0.1–1.0)
Monocytes Relative: 15 %
Neutro Abs: 0.6 10*3/uL — ABNORMAL LOW (ref 1.7–7.7)
Neutrophils Relative %: 42 %
Platelet Count: 139 10*3/uL — ABNORMAL LOW (ref 150–400)
RBC: 3.55 MIL/uL — ABNORMAL LOW (ref 3.87–5.11)
RDW: 13.8 % (ref 11.5–15.5)
Smear Review: NORMAL
WBC Count: 1.4 10*3/uL — ABNORMAL LOW (ref 4.0–10.5)
nRBC: 0 % (ref 0.0–0.2)

## 2021-08-16 LAB — CMP (CANCER CENTER ONLY)
ALT: 32 U/L (ref 0–44)
AST: 16 U/L (ref 15–41)
Albumin: 2.8 g/dL — ABNORMAL LOW (ref 3.5–5.0)
Alkaline Phosphatase: 73 U/L (ref 38–126)
Anion gap: 6 (ref 5–15)
BUN: 15 mg/dL (ref 6–20)
CO2: 28 mmol/L (ref 22–32)
Calcium: 7.8 mg/dL — ABNORMAL LOW (ref 8.9–10.3)
Chloride: 106 mmol/L (ref 98–111)
Creatinine: 0.62 mg/dL (ref 0.44–1.00)
GFR, Estimated: >60 mL/min (ref >60)
Glucose, Bld: 101 mg/dL — ABNORMAL HIGH (ref 70–99)
Potassium: 3.6 mmol/L (ref 3.5–5.1)
Sodium: 140 mmol/L (ref 135–145)
Total Bilirubin: 0.4 mg/dL (ref 0.3–1.2)
Total Protein: 5.1 g/dL — ABNORMAL LOW (ref 6.5–8.1)

## 2021-08-16 MED ORDER — SODIUM CHLORIDE 0.9% FLUSH
10.0000 mL | Freq: Once | INTRAVENOUS | Status: AC
  Administered 2021-08-16: 10 mL via INTRAVENOUS

## 2021-08-16 MED ORDER — SODIUM CHLORIDE 0.9 % IV SOLN
Freq: Once | INTRAVENOUS | Status: AC
Start: 2021-08-16 — End: 2021-08-16

## 2021-08-16 MED ORDER — HEPARIN SOD (PORK) LOCK FLUSH 100 UNIT/ML IV SOLN
500.0000 [IU] | Freq: Once | INTRAVENOUS | Status: AC
  Administered 2021-08-16: 500 [IU] via INTRAVENOUS

## 2021-08-16 NOTE — Patient Instructions (Signed)

## 2021-08-25 ENCOUNTER — Other Ambulatory Visit: Payer: Self-pay | Admitting: Adult Health

## 2021-08-25 DIAGNOSIS — Z170 Estrogen receptor positive status [ER+]: Secondary | ICD-10-CM

## 2021-08-30 ENCOUNTER — Other Ambulatory Visit: Payer: Self-pay | Admitting: *Deleted

## 2021-08-30 DIAGNOSIS — C50411 Malignant neoplasm of upper-outer quadrant of right female breast: Secondary | ICD-10-CM

## 2021-08-30 DIAGNOSIS — Z170 Estrogen receptor positive status [ER+]: Secondary | ICD-10-CM

## 2021-08-30 NOTE — Progress Notes (Signed)
[facility]  [phone] [phone]    ID: Sophia Moore DOB: 1980/10/13  MR#: [id number]  UDT#:[id number]  Patient Care Team: [facility] as PCP - General (Family Medicine) Mauro Kaufmann, RN as Oncology Nurse Navigator Rockwell Germany, RN as Oncology Nurse Navigator Rolm Bookbinder, MD as Consulting Physician (General Surgery) Kaliopi Blyden, Virgie Dad, MD as Consulting Physician (Oncology) Eppie Gibson, MD as Attending Physician (Radiation Oncology) Chauncey Cruel, MD OTHER MD:  CHIEF COMPLAINT: Estrogen receptor positive breast cancer  CURRENT TREATMENT: adjvuant chemotherapy    INTERVAL HISTORY: Sophia Moore returns today for follow up and treatment of her estrogen receptor positive breast cancer.   She is receiving adjuvant chemotherapy with Docetaxel and cyclophosphamide, given every 21 days with udenyca on day 3 and started 06/30/2021.   Today is day 1 cycle 4, her last cycle  REVIEW OF SYSTEMS: Sophia Moore has found that receiving fluids for several days after each cycle helps quite a bit and so we are doing that this cycle as well.  She is having minimal nausea and no vomiting.  She walks 30 minutes a day and the third week she actually trucks a little as she puts it.  She tells me her husband does not like her to continue talking about her symptoms and wants her to look forward and this is of course a classic interaction.  We discussed how to deal with that.  She understands however that it does not mean he does not care about her.  It is a cultural issue.  Aside from that a detailed review of systems today was benign and in particular she denies any peripheral neuropathy symptoms.Marland Kitchen  COVID 19 VACCINATION STATUS: not vaccinated, infection 05/2019 (results in [place])   HISTORY OF CURRENT ILLNESS: From the original intake note:  Sophia Moore had routine screening mammography (her first ever)  on 03/22/2021 showing a possible abnormality in the right breast. She underwent right diagnostic mammography with tomography and right breast ultrasonography at [facility] on 04/19/2021 showing: breast density category C; palpable 6.3 cm mass involving the central and upper half of the right breast; 2-4 satellite nodules within upper-outer quadrant; no right axillary adenopathy.  Accordingly on 04/26/2021 she proceeded to biopsy of the right breast area in question. 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 Proliferation marker Ki67 at 5%. HER2 equivocal by immunohistochemistry (2+), but negative by fluorescent in situ hybridization with a signals ratio 1.2 and number per cell 1.8. 2. Right Breast, upper-outer axillary tail  - flat epithelial atypia with calcifications   Cancer Staging  Malignant neoplasm of upper-outer quadrant of right breast in female, estrogen receptor positive ([place]) Staging form: Breast, AJCC 8th Edition - Clinical stage from 05/04/2021: Stage IIA (cT3, cN0, cM0, G2, ER+, PR+, HER2-) - Signed by Chauncey Cruel, MD on 05/04/2021 Stage prefix: Initial diagnosis Histologic grading system: 3 grade system Laterality: Right Staged by: Pathologist and managing physician Stage used in treatment planning: Yes National guidelines used in treatment planning: Yes Type of national guideline used in treatment planning: NCCN  The patient's subsequent history is as detailed below.   PAST MEDICAL HISTORY: Past Medical History:  Diagnosis Date   Depression    takes Citalopram daily  Family history of breast cancer    Thyroid nodule 07/2016    PAST SURGICAL HISTORY: Past Surgical  History:  Procedure Laterality Date   IR IMAGING GUIDED PORT INSERTION  06/27/2021   MASTECTOMY W/ SENTINEL NODE BIOPSY Right 06/01/2021   Procedure: RIGHT MASTECTOMY WITH AXILLARY SENTINEL LYMPH NODE BIOPSY;  Surgeon: Rolm Bookbinder, MD;  Location: [facility];  Service: General;  Laterality: Right;   THYROIDECTOMY  09/13/2016   THYROIDECTOMY N/A 09/13/2016   Procedure: TOTAL THYROIDECTOMY;  Surgeon: Leta Baptist, MD;  Location: [facility];  Service: ENT;  Laterality: N/A;   wisdom teeth extracted      WISDOM TOOTH EXTRACTION      FAMILY HISTORY: Family History  Problem Relation Age of Onset   Hyperlipidemia Mother    Stroke Mother    Breast cancer Maternal Aunt        dx 2s   Her parents are both living, her father age 18 and her mother age 59, as of 04/2021. Sophia Moore has two brothers and three sisters. She reports breast cancer in a maternal aunt (age 70+).   GYNECOLOGIC HISTORY:  No LMP recorded. Menarche: unsure Age at first live birth: 40 years old Zena P 3 LMP 04/13/2021 Contraceptive never used HRT n/a  Hysterectomy? no BSO? 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  She is originally from the [place] section of [place].  Husband Sophia Moore work in Architect. She lives at home with daughter Sophia Moore, age 46, and [name], age 18. Her oldest [name], age 105, works at [employer]. She attends a [employer].    ADVANCED DIRECTIVES: In the absence of any documentation to the contrary, the patient's spouse is their HCPOA.    HEALTH MAINTENANCE: Social History   Tobacco Use   Smoking status: Never   Smokeless tobacco: Never  Vaping Use   Vaping Use: Never used  Substance Use Topics   Alcohol use: No   Drug use: No     Colonoscopy: n/a (age)  PAP: 03/2021  Bone density: n/a (age)   Allergies  Allergen Reactions   Amoxicillin Rash    Penicillins Rash    Has patient had a PCN reaction causing immediate rash, facial/tongue/throat swelling, SOB or lightheadedness with hypotension: Yes Has patient had a PCN reaction causing severe rash involving mucus membranes or skin necrosis: No Has patient had a PCN reaction that required hospitalization No Has patient had a PCN reaction occurring within the last 10 years: Yes If all of the above answers are "NO", then may proceed with Cephalosporin use.     Current Outpatient Medications  Medication Sig Dispense Refill   cholecalciferol (VITAMIN D3) 25 MCG (1000 UNIT) tablet Take 1,000 Units by mouth daily.     ciprofloxacin (CIPRO) 500 MG tablet Take 1 tablet (500 mg total) by mouth 2 (two) times daily. 40 tablet 0   citalopram (CELEXA) 20 MG tablet Take 20 mg by mouth daily.     colestipol (COLESTID) 5 g granules TAKE 5 GRAMS BY MOUTH 2 (TWO) TIMES DAILY. 500 g 0   dexamethasone (DECADRON) 4 MG tablet Take 2 tablets (8 mg total) by mouth 2 (two) times daily. Start the day before Taxotere. Then again the day after chemo for 3 days. 30 tablet 1   levothyroxine (SYNTHROID) 88 MCG tablet TAKE 1 TABLET BY MOUTH EVERY DAY BEFORE  BREAKFAST (Patient taking differently: Take 88 mcg by mouth daily before breakfast.) 90 tablet 1   lidocaine-prilocaine (EMLA) cream Apply to affected area once (Patient taking differently: Apply 1 application topically once as needed (port access).) 30 g 3   loperamide (IMODIUM) 2 MG capsule Take 2 mg by mouth as needed for diarrhea or loose stools.     methocarbamol (ROBAXIN) 750 MG tablet Take 1 tablet (750 mg total) by mouth 4 (four) times daily as needed (use for muscle cramps/pain). 30 tablet 0   oxyCODONE (OXY IR/ROXICODONE) 5 MG immediate release tablet Take 1 tablet (5 mg total) by mouth every 6 (six) hours as needed. 10 tablet 0   prochlorperazine (COMPAZINE) 10 MG tablet Take 1 tablet (10 mg total) by mouth every 6 (six) hours as needed (Nausea or vomiting).  30 tablet 1   No current facility-administered medications for this visit.    OBJECTIVE: Spanish speaker in no acute distress  Vitals:   08/31/21 0931  BP: 107/68  Pulse: 71  Resp: 17  Temp: (!) 97.3 F (36.3 C)  SpO2: 100%     Body mass index is 22.96 kg/m.   Wt Readings from Last 3 Encounters:  08/31/21 121 lb 8 oz (55.1 kg)  08/16/21 119 lb 12.8 oz (54.3 kg)  08/10/21 117 lb (53.1 kg)  ECOG FS:1 - Symptomatic but completely ambulatory  Sclerae unicteric, EOMs intact Wearing a mask No cervical or supraclavicular adenopathy Lungs no rales or rhonchi Heart regular rate and rhythm Abd soft, nontender, positive bowel sounds MSK no focal spinal tenderness, no upper extremity lymphedema Neuro: nonfocal, well oriented, appropriate affect Breasts: Deferred   LAB RESULTS:  CMP     Component Value Date/Time   NA 142 08/31/2021 0905   K 3.7 08/31/2021 0905   CL 106 08/31/2021 0905   CO2 27 08/31/2021 0905   GLUCOSE 131 (H) 08/31/2021 0905   BUN 16 08/31/2021 0905   CREATININE 0.73 08/31/2021 0905   CREATININE 0.62 08/16/2021 0939   CALCIUM 9.4 08/31/2021 0905   PROT 7.0 08/31/2021 0905   ALBUMIN 3.7 08/31/2021 0905   AST 22 08/31/2021 0905   AST 16 08/16/2021 0939   ALT 51 (H) 08/31/2021 0905   ALT 32 08/16/2021 0939   ALKPHOS 90 08/31/2021 0905   BILITOT 0.3 08/31/2021 0905   BILITOT 0.4 08/16/2021 0939   GFRNONAA >60 08/31/2021 0905   GFRNONAA >60 08/16/2021 0939    No results found for: TOTALPROTELP, ALBUMINELP, A1GS, A2GS, BETS, BETA2SER, GAMS, MSPIKE, SPEI  Lab Results  Component Value Date   WBC 12.7 (H) 08/31/2021   NEUTROABS 10.7 (H) 08/31/2021   HGB 12.0 08/31/2021   HCT 35.0 (L) 08/31/2021   MCV 88.6 08/31/2021   PLT 370 08/31/2021    No results found for: LABCA2  No components found for: YBOFBP102  No results for input(s): INR in the last 168 hours.  No results found for: LABCA2  No results found for: HEN277  No results found for:  OEU235  No results found for: TIR443  No results found for: CA2729  No components found for: HGQUANT  No results found for: CEA1 / No results found for: CEA1   No results found for: AFPTUMOR  No results found for: CHROMOGRNA  No results found for: KPAFRELGTCHN, LAMBDASER, KAPLAMBRATIO (kappa/lambda light chains)  No results found for: HGBA, HGBA2QUANT, HGBFQUANT, HGBSQUAN (Hemoglobinopathy evaluation)   No results found for: LDH  No results found for: IRON, TIBC, IRONPCTSAT (Iron and TIBC)  No results found for: FERRITIN  Urinalysis    Component Value Date/Time   COLORURINE YELLOW 07/06/2021 [place] 07/06/2021 1025   LABSPEC 1.010 07/06/2021 1025   PHURINE 7.0 07/06/2021 1025   GLUCOSEU NEGATIVE 07/06/2021 1025   HGBUR SMALL (A) 07/06/2021 1025   BILIRUBINUR NEGATIVE 07/06/2021 1025   [place] 07/06/2021 1025   PROTEINUR NEGATIVE 07/06/2021 1025   NITRITE NEGATIVE 07/06/2021 1025   LEUKOCYTESUR NEGATIVE 07/06/2021 1025    STUDIES: No results found.   ELIGIBLE FOR AVAILABLE RESEARCH PROTOCOL: no  ASSESSMENT: 40 y.o. Spanish speaker status post right breast upper outer quadrant biopsy 04/26/2021 for a clinical mT3 N0, stage IIA invasive ductal carcinoma, grade 1 or 2, estrogen and progesterone receptor positive, HER2 not amplified, with an MIB-1 of 5%.  (1) genetics testing 05/16/2021 through the [employer] panel (report date 05/11/2021) or CancerNext-Expanded + RNAinsight panel (report date 05/16/2021).   The BRCAplus panel offered by [employer] and includes sequencing and deletion/duplication analysis for the following 8 genes: ATM, BRCA1, BRCA2, CDH1, CHEK2, PALB2, PTEN, and TP53. The CancerNext-Expanded + RNAinsight gene panel offered by [employer] and includes sequencing and rearrangement analysis for the following 77 genes: AIP, ALK, APC, ATM, AXIN2, BAP1, BARD1, BLM, BMPR1A, BRCA1, BRCA2, BRIP1, CDC73, CDH1, CDK4, CDKN1B,  CDKN2A, CHEK2, CTNNA1, DICER1, FANCC, FH, FLCN, GALNT12, KIF1B, LZTR1, MAX, MEN1, MET, MLH1, MSH2, MSH3, MSH6, MUTYH, NBN, NF1, NF2, NTHL1, PALB2, PHOX2B, PMS2, POT1, PRKAR1A, PTCH1, PTEN, RAD51C, RAD51D, RB1, RECQL, RET, SDHA, SDHAF2, SDHB, SDHC, SDHD, SMAD4, SMARCA4, SMARCB1, SMARCE1, STK11, SUFU, TMEM127, TP53, TSC1, TSC2, VHL and XRCC2 (sequencing and deletion/duplication); EGFR, EGLN1, HOXB13, KIT, MITF, PDGFRA, POLD1 and POLE (sequencing only); EPCAM and GREM1 (deletion/duplication only). RNA data is routinely analyzed for use in variant interpretation for all genes.  (A) A variant of uncertain significance (VUS) was detected in the [facility] gene called p.R1369H (c.4106G>A).  (2) MammaPrint obtained from the 04/26/2021 biopsy returned luminal a type, low risk, with a predicted benefit from additional chemotherapy in the 1-2% range [given the patient's young age however, the benefit of chemotherapy might be as high as 5%].  (3) status post right mastectomy and sentinel lymph node sampling 06/01/2021 for an mpT3 pN1-2, stage IIA invasive ductal carcinoma, grade 1, with negative margins  (A) a total of 2 right axillary lymph nodes were removed, both positive, 1 with extracapsular extension  (B) completion axillary dissection being considered  (3) adjuvant chemotherapy with docetaxel, cyclophosphamide given on day 1 of a 21 day cycle x 4 from 06/30/2021 through 09/01/2021  (4) adjuvant radiation  (5) tamoxifen started 05/04/2021 in anticipation of surgical delays; held during chemotherapy.   PLAN: Charish completes her chemotherapy today.  She has done generally very well.  She will receive fluids 4 days between now and next week in addition to her last pegfilgrastim shot.  She will start Cipro day 5 and take it for 5 days as before.  She will then be ready to proceed to radiation.  I am making her a return with [place] on 10/04/2021 to check labs, make sure there are no residual side effects from  chemo, and make sure she proceeds appropriately to the neck step.  I have let Dr. Isidore Moos know in case she could see her the same day.  Her tamoxifen could be resumed then or at the end of radiation.  Juliann Mule knows to call us for any other issue that may develop before the next visit.  Total encounter  time 25 minutes.Sarajane Jews C. Sophiagrace Benbrook, MD 08/31/21 9:53 AM Medical Oncology and Hematology [facility] [place], [place] [address] Tel. [phone]    Fax. [phone]   I, Wilburn Mylar, am acting as scribe for Dr. Virgie Dad. Stephany Poorman.  I, Lurline Del MD, have reviewed the above documentation for accuracy and completeness, and I agree with the above.    *Total Encounter Time as defined by the Centers for Medicare and Medicaid Services includes, in addition to the face-to-face time of a patient visit (documented in the note above) non-face-to-face time: obtaining and reviewing outside history, ordering and reviewing medications, tests or procedures, care coordination (communications with other health care professionals or caregivers) and documentation in the medical record.

## 2021-08-31 ENCOUNTER — Other Ambulatory Visit: Payer: Self-pay | Admitting: *Deleted

## 2021-08-31 ENCOUNTER — Inpatient Hospital Stay: Payer: BC Managed Care – PPO

## 2021-08-31 ENCOUNTER — Inpatient Hospital Stay (HOSPITAL_BASED_OUTPATIENT_CLINIC_OR_DEPARTMENT_OTHER): Payer: BC Managed Care – PPO | Admitting: Oncology

## 2021-08-31 ENCOUNTER — Ambulatory Visit: Payer: BC Managed Care – PPO | Admitting: Oncology

## 2021-08-31 ENCOUNTER — Other Ambulatory Visit: Payer: BC Managed Care – PPO

## 2021-08-31 ENCOUNTER — Encounter: Payer: Self-pay | Admitting: *Deleted

## 2021-08-31 ENCOUNTER — Other Ambulatory Visit: Payer: Self-pay

## 2021-08-31 ENCOUNTER — Ambulatory Visit: Payer: BC Managed Care – PPO

## 2021-08-31 VITALS — BP 107/68 | HR 71 | Temp 97.3°F | Resp 17 | Wt 121.5 lb

## 2021-08-31 DIAGNOSIS — Z170 Estrogen receptor positive status [ER+]: Secondary | ICD-10-CM

## 2021-08-31 DIAGNOSIS — C50411 Malignant neoplasm of upper-outer quadrant of right female breast: Secondary | ICD-10-CM

## 2021-08-31 DIAGNOSIS — Z95828 Presence of other vascular implants and grafts: Secondary | ICD-10-CM

## 2021-08-31 LAB — CBC WITH DIFFERENTIAL/PLATELET
Abs Immature Granulocytes: 0.11 10*3/uL — ABNORMAL HIGH (ref 0.00–0.07)
Basophils Absolute: 0 10*3/uL (ref 0.0–0.1)
Basophils Relative: 0 %
Eosinophils Absolute: 0 10*3/uL (ref 0.0–0.5)
Eosinophils Relative: 0 %
HCT: 35 % — ABNORMAL LOW (ref 36.0–46.0)
Hemoglobin: 12 g/dL (ref 12.0–15.0)
Immature Granulocytes: 1 %
Lymphocytes Relative: 9 %
Lymphs Abs: 1.1 10*3/uL (ref 0.7–4.0)
MCH: 30.4 pg (ref 26.0–34.0)
MCHC: 34.3 g/dL (ref 30.0–36.0)
MCV: 88.6 fL (ref 80.0–100.0)
Monocytes Absolute: 0.7 10*3/uL (ref 0.1–1.0)
Monocytes Relative: 6 %
Neutro Abs: 10.7 10*3/uL — ABNORMAL HIGH (ref 1.7–7.7)
Neutrophils Relative %: 84 %
Platelets: 370 10*3/uL (ref 150–400)
RBC: 3.95 MIL/uL (ref 3.87–5.11)
RDW: 15.3 % (ref 11.5–15.5)
WBC: 12.7 10*3/uL — ABNORMAL HIGH (ref 4.0–10.5)
nRBC: 0.2 % (ref 0.0–0.2)

## 2021-08-31 LAB — COMPREHENSIVE METABOLIC PANEL
ALT: 51 U/L — ABNORMAL HIGH (ref 0–44)
AST: 22 U/L (ref 15–41)
Albumin: 3.7 g/dL (ref 3.5–5.0)
Alkaline Phosphatase: 90 U/L (ref 38–126)
Anion gap: 9 (ref 5–15)
BUN: 16 mg/dL (ref 6–20)
CO2: 27 mmol/L (ref 22–32)
Calcium: 9.4 mg/dL (ref 8.9–10.3)
Chloride: 106 mmol/L (ref 98–111)
Creatinine, Ser: 0.73 mg/dL (ref 0.44–1.00)
GFR, Estimated: >60 mL/min (ref >60)
Glucose, Bld: 131 mg/dL — ABNORMAL HIGH (ref 70–99)
Potassium: 3.7 mmol/L (ref 3.5–5.1)
Sodium: 142 mmol/L (ref 135–145)
Total Bilirubin: 0.3 mg/dL (ref 0.3–1.2)
Total Protein: 7 g/dL (ref 6.5–8.1)

## 2021-08-31 LAB — PREGNANCY, URINE: Preg Test, Ur: NEGATIVE

## 2021-08-31 MED ORDER — SODIUM CHLORIDE 0.9 % IV SOLN
600.0000 mg/m2 | Freq: Once | INTRAVENOUS | Status: AC
  Administered 2021-08-31: 920 mg via INTRAVENOUS
  Filled 2021-08-31: qty 46

## 2021-08-31 MED ORDER — PALONOSETRON HCL INJECTION 0.25 MG/5ML
0.2500 mg | Freq: Once | INTRAVENOUS | Status: AC
  Administered 2021-08-31: 0.25 mg via INTRAVENOUS
  Filled 2021-08-31: qty 5

## 2021-08-31 MED ORDER — SODIUM CHLORIDE 0.9 % IV SOLN
75.0000 mg/m2 | Freq: Once | INTRAVENOUS | Status: AC
  Administered 2021-08-31: 110 mg via INTRAVENOUS
  Filled 2021-08-31: qty 11

## 2021-08-31 MED ORDER — SODIUM CHLORIDE 0.9% FLUSH: 10.0000 mL | INTRAVENOUS | Status: DC | PRN

## 2021-08-31 MED ORDER — HEPARIN SOD (PORK) LOCK FLUSH 100 UNIT/ML IV SOLN: 500.0000 [IU] | Freq: Once | INTRAVENOUS | Status: DC | PRN

## 2021-08-31 MED ORDER — SODIUM CHLORIDE 0.9 % IV SOLN
Freq: Once | INTRAVENOUS | Status: AC
Start: 2021-08-31 — End: 2021-08-31

## 2021-08-31 MED ORDER — DEXAMETHASONE 4 MG PO TABS
8.0000 mg | ORAL_TABLET | Freq: Once | ORAL | Status: DC
  Filled 2021-08-31: qty 2

## 2021-08-31 MED ORDER — SODIUM CHLORIDE 0.9% FLUSH
10.0000 mL | INTRAVENOUS | Status: DC | PRN
  Administered 2021-08-31: 10 mL via INTRAVENOUS

## 2021-08-31 MED ORDER — SODIUM CHLORIDE 0.9 % IV SOLN
10.0000 mg | Freq: Once | INTRAVENOUS | Status: AC
  Administered 2021-08-31: 10 mg via INTRAVENOUS
  Filled 2021-08-31: qty 10

## 2021-08-31 NOTE — Patient Instructions (Signed)
[facility]  Discharge Instructions: Thank you for choosing [facility] to provide your oncology and hematology care.   If you have a lab appointment with the [facility], please go directly to the [facility] and check in at the registration area.   Wear comfortable clothing and clothing appropriate for easy access to any Portacath or PICC line.   We strive to give you quality time with your provider. You may need to reschedule your appointment if you arrive late (15 or more minutes).  Arriving late affects you and other patients whose appointments are after yours.  Also, if you miss three or more appointments without notifying the office, you may be dismissed from the clinic at the provider's discretion.      For prescription refill requests, have your pharmacy contact our office and allow 72 hours for refills to be completed.    Today you received the following chemotherapy and/or immunotherapy agents: Docetaxel & Cytoxan   To help prevent nausea and vomiting after your treatment, we encourage you to take your nausea medication as directed.  BELOW ARE SYMPTOMS THAT SHOULD BE REPORTED IMMEDIATELY: *FEVER GREATER THAN 100.4 F (38 C) OR HIGHER *CHILLS OR SWEATING *NAUSEA AND VOMITING THAT IS NOT CONTROLLED WITH YOUR NAUSEA MEDICATION *UNUSUAL SHORTNESS OF BREATH *UNUSUAL BRUISING OR BLEEDING *URINARY PROBLEMS (pain or burning when urinating, or frequent urination) *BOWEL PROBLEMS (unusual diarrhea, constipation, pain near the anus) TENDERNESS IN MOUTH AND THROAT WITH OR WITHOUT PRESENCE OF ULCERS (sore throat, sores in mouth, or a toothache) UNUSUAL RASH, SWELLING OR PAIN  UNUSUAL VAGINAL DISCHARGE OR ITCHING   Items with * indicate a potential emergency and should be followed up as soon as possible or go to the Emergency Department if any problems should occur.  Please show the CHEMOTHERAPY ALERT CARD or IMMUNOTHERAPY ALERT CARD at  check-in to the Emergency Department and triage nurse.  Should you have questions after your visit or need to cancel or reschedule your appointment, please contact [facility]  Dept: [phone]  and follow the prompts.  Office hours are 8:00 a.m. to 4:30 p.m. Monday - Friday. Please note that voicemails left after 4:00 p.m. may not be returned until the following business day.  We are closed weekends and major holidays. You have access to a nurse at all times for urgent questions. Please call the main number to the clinic Dept: [phone] and follow the prompts.   For any non-urgent questions, you may also contact your provider using MyChart. We now offer e-Visits for anyone 87 and older to request care online for non-urgent symptoms. For details visit [email or web address].   Also download the MyChart app! Go to the app store, search "MyChart", open the app, select [facility], and log in with your MyChart username and password.  Due to Covid, a mask is required upon entering the hospital/clinic. If you do not have a mask, one will be given to you upon arrival. For doctor visits, patients may have 1 support person aged 42 or older with them. For treatment visits, patients cannot have anyone with them due to current Covid guidelines and our immunocompromised population.

## 2021-08-31 NOTE — Addendum Note (Signed)
Addended by: Chauncey Cruel on: 08/31/2021 10:19 AM   Modules accepted: Orders

## 2021-09-02 ENCOUNTER — Other Ambulatory Visit: Payer: Self-pay

## 2021-09-02 ENCOUNTER — Inpatient Hospital Stay: Payer: BC Managed Care – PPO

## 2021-09-02 VITALS — BP 102/75 | HR 57 | Temp 98.2°F | Resp 16

## 2021-09-02 DIAGNOSIS — Z95828 Presence of other vascular implants and grafts: Secondary | ICD-10-CM

## 2021-09-02 DIAGNOSIS — C50411 Malignant neoplasm of upper-outer quadrant of right female breast: Secondary | ICD-10-CM | POA: Diagnosis not present

## 2021-09-02 DIAGNOSIS — Z170 Estrogen receptor positive status [ER+]: Secondary | ICD-10-CM

## 2021-09-02 MED ORDER — PEGFILGRASTIM-CBQV 6 MG/0.6ML ~~LOC~~ SOSY
6.0000 mg | PREFILLED_SYRINGE | Freq: Once | SUBCUTANEOUS | Status: AC
  Administered 2021-09-02: 6 mg via SUBCUTANEOUS
  Filled 2021-09-02: qty 0.6

## 2021-09-02 MED ORDER — SODIUM CHLORIDE 0.9% FLUSH
10.0000 mL | INTRAVENOUS | Status: DC | PRN
  Administered 2021-09-02: 10 mL via INTRAVENOUS

## 2021-09-02 MED ORDER — HEPARIN SOD (PORK) LOCK FLUSH 100 UNIT/ML IV SOLN
500.0000 [IU] | Freq: Once | INTRAVENOUS | Status: AC
  Administered 2021-09-02: 500 [IU] via INTRAVENOUS

## 2021-09-02 MED ORDER — SODIUM CHLORIDE 0.9 % IV SOLN: Freq: Once | INTRAVENOUS | Status: AC

## 2021-09-02 NOTE — Progress Notes (Signed)
error 

## 2021-09-02 NOTE — Patient Instructions (Signed)
Rehydration, Adult Rehydration is the replacement of body fluids, salts, and minerals (electrolytes) that are lost during dehydration. Dehydration is when there is not enough water or other fluids in the body. This happens when you lose more fluids than you take in. Common causes of dehydration include: Not drinking enough fluids. This can occur when you are ill or doing activities that require a lot of energy, especially in hot weather. Conditions that cause loss of water or other fluids, such as diarrhea, vomiting, sweating, or urinating a lot. Other illnesses, such as fever or infection. Certain medicines, such as those that remove excess fluid from the body (diuretics). Symptoms of mild or moderate dehydration may include thirst, dry lips and mouth, and dizziness. Symptoms of severe dehydration may include increased heart rate, confusion, fainting, and not urinating. For severe dehydration, you may need to get fluids through an IV at the hospital. For mild or moderate dehydration, you can usually rehydrate at home by drinking certain fluids as told by your health care provider. What are the risks? Generally, rehydration is safe. However, taking in too much fluid (overhydration) can be a problem. This is rare. Overhydration can cause an electrolyte imbalance, kidney failure, or a decrease in salt (sodium) levels in the body. Supplies needed You will need an oral rehydration solution (ORS) if your health care provider tells you to use one. This is a drink to treat dehydration. It can be found in pharmacies and retail stores. How to rehydrate Fluids Follow instructions from your health care provider for rehydration. The kind of fluid and the amount you should drink depend on your condition. In general, you should choose drinks that you prefer. If told by your health care provider, drink an ORS. Make an ORS by following instructions on the package. Start by drinking small amounts, about  cup (120  mL) every 5-10 minutes. Slowly increase how much you drink until you have taken the amount recommended by your health care provider. Drink enough clear fluids to keep your urine pale yellow. If you were told to drink an ORS, finish it first, then start slowly drinking other clear fluids. Drink fluids such as: Water. This includes sparkling water and flavored water. Drinking only water can lead to having too little sodium in your body (hyponatremia). Follow the advice of your health care provider. Water from ice chips you suck on. Fruit juice with water you add to it (diluted). Sports drinks. Hot or cold herbal teas. Broth-based soups. Milk or milk products. Food Follow instructions from your health care provider about what to eat while you rehydrate. Your health care provider may recommend that you slowly begin eating regular foods in small amounts. Eat foods that contain a healthy balance of electrolytes, such as bananas, oranges, potatoes, tomatoes, and spinach. Avoid foods that are greasy or contain a lot of sugar. In some cases, you may get nutrition through a feeding tube that is passed through your nose and into your stomach (nasogastric tube, or NG tube). This may be done if you have uncontrolled vomiting or diarrhea. Beverages to avoid Certain beverages may make dehydration worse. While you rehydrate, avoid drinking alcohol. How to tell if you are recovering from dehydration You may be recovering from dehydration if: You are urinating more often than before you started rehydrating. Your urine is pale yellow. Your energy level improves. You vomit less frequently. You have diarrhea less frequently. Your appetite improves or returns to normal. You feel less dizzy or less light-headed. Your  skin tone and color start to look more normal. Follow these instructions at home: Take over-the-counter and prescription medicines only as told by your health care provider. Do not take sodium  tablets. Doing this can lead to having too much sodium in your body (hypernatremia). Contact a health care provider if: You continue to have symptoms of mild or moderate dehydration, such as: Thirst. Dry lips. Slightly dry mouth. Dizziness. Dark urine or less urine than normal. Muscle cramps. You continue to vomit or have diarrhea. Get help right away if you: Have symptoms of dehydration that get worse. Have a fever. Have a severe headache. Have been vomiting and the following happens: Your vomiting gets worse or does not go away. Your vomit includes blood or green matter (bile). You cannot eat or drink without vomiting. Have problems with urination or bowel movements, such as: Diarrhea that gets worse or does not go away. Blood in your stool (feces). This may cause stool to look black and tarry. Not urinating, or urinating only a small amount of very dark urine, within 6-8 hours. Have trouble breathing. Have symptoms that get worse with treatment. These symptoms may represent a serious problem that is an emergency. Do not wait to see if the symptoms will go away. Get medical help right away. Call your local emergency services (911 in the [place] Do not drive yourself to the hospital. Summary Rehydration is the replacement of body fluids and minerals (electrolytes) that are lost during dehydration. Follow instructions from your health care provider for rehydration. The kind of fluid and amount you should drink depend on your condition. Slowly increase how much you drink until you have taken the amount recommended by your health care provider. Contact your health care provider if you continue to show signs of mild or moderate dehydration. This information is not intended to replace advice given to you by your health care provider. Make sure you discuss any questions you have with your health care provider. Document Revised: 11/26/2019 Document Reviewed: 10/06/2019 Elsevier Patient  Education  2022 [place].   Pegfilgrastim Injection What is this medication? PEGFILGRASTIM (PEG fil gra stim) lowers the risk of infection in people who are receiving chemotherapy. It works by Building control surveyor make more white blood cells, which protects your body from infection. It may also be used to help people who have been exposed to high doses of radiation. This medicine may be used for other purposes; ask your health care provider or pharmacist if you have questions. COMMON BRAND NAME(S): Rexene Edison, Ziextenzo What should I tell my care team before I take this medication? They need to know if you have any of these conditions: Kidney disease Latex allergy Ongoing radiation therapy Sickle cell disease Skin reactions to acrylic adhesives (On-Body Injector only) An unusual or allergic reaction to pegfilgrastim, filgrastim, other medications, foods, dyes, or preservatives Pregnant or trying to get pregnant Breast-feeding How should I use this medication? This medication is for injection under the skin. If you get this medication at home, you will be taught how to prepare and give the pre-filled syringe or how to use the On-body Injector. Refer to the patient Instructions for Use for detailed instructions. Use exactly as directed. Tell your care team immediately if you suspect that the On-body Injector may not have performed as intended or if you suspect the use of the On-body Injector resulted in a missed or partial dose. It is important that you put your used needles and syringes  in a special sharps container. Do not put them in a trash can. If you do not have a sharps container, call your pharmacist or care team to get one. Talk to your care team about the use of this medication in children. While this medication may be prescribed for selected conditions, precautions do apply. Overdosage: If you think you have taken too much of this medicine contact a poison  control center or emergency room at once. NOTE: This medicine is only for you. Do not share this medicine with others. What if I miss a dose? It is important not to miss your dose. Call your care team if you miss your dose. If you miss a dose due to an On-body Injector failure or leakage, a new dose should be administered as soon as possible using a single prefilled syringe for manual use. What may interact with this medication? Interactions have not been studied. This list may not describe all possible interactions. Give your health care provider a list of all the medicines, herbs, non-prescription drugs, or dietary supplements you use. Also tell them if you smoke, drink alcohol, or use illegal drugs. Some items may interact with your medicine. What should I watch for while using this medication? Your condition will be monitored carefully while you are receiving this medication. You may need blood work done while you are taking this medication. Talk to your care team about your risk of cancer. You may be more at risk for certain types of cancer if you take this medication. If you are going to need a MRI, CT scan, or other procedure, tell your care team that you are using this medication (On-Body Injector only). What side effects may I notice from receiving this medication? Side effects that you should report to your care team as soon as possible: Allergic reactions--skin rash, itching, hives, swelling of the face, lips, tongue, or throat Capillary leak syndrome--stomach or muscle pain, unusual weakness or fatigue, feeling faint or lightheaded, decrease in the amount of urine, swelling of the ankles, hands, or feet, trouble breathing High white blood cell level--fever, fatigue, trouble breathing, night sweats, change in vision, weight loss Inflammation of the aorta--fever, fatigue, back, chest, or stomach pain, severe headache Kidney injury (glomerulonephritis)--decrease in the amount of urine, red  or dark brown urine, foamy or bubbly urine, swelling of the ankles, hands, or feet Shortness of breath or trouble breathing Spleen injury--pain in upper left stomach or shoulder Unusual bruising or bleeding Side effects that usually do not require medical attention (report to your care team if they continue or are bothersome): Bone pain Pain in the hands or feet This list may not describe all possible side effects. Call your doctor for medical advice about side effects. You may report side effects to FDA at 1-800-FDA-1088. Where should I keep my medication? Keep out of the reach of children. If you are using this medication at home, you will be instructed on how to store it. Throw away any unused medication after the expiration date on the label. NOTE: This sheet is a summary. It may not cover all possible information. If you have questions about this medicine, talk to your doctor, pharmacist, or health care provider.  2022 Elsevier/[employer] Standard (2021-06-14 00:00:00)

## 2021-09-03 ENCOUNTER — Inpatient Hospital Stay: Payer: BC Managed Care – PPO

## 2021-09-03 DIAGNOSIS — C50411 Malignant neoplasm of upper-outer quadrant of right female breast: Secondary | ICD-10-CM | POA: Diagnosis not present

## 2021-09-03 DIAGNOSIS — Z170 Estrogen receptor positive status [ER+]: Secondary | ICD-10-CM

## 2021-09-03 MED ORDER — SODIUM CHLORIDE 0.9 % IV SOLN: Freq: Once | INTRAVENOUS | Status: AC

## 2021-09-03 MED ORDER — SODIUM CHLORIDE 0.9% FLUSH
10.0000 mL | Freq: Once | INTRAVENOUS | Status: AC
  Administered 2021-09-03: 10 mL via INTRAVENOUS

## 2021-09-03 MED ORDER — HEPARIN SOD (PORK) LOCK FLUSH 100 UNIT/ML IV SOLN
500.0000 [IU] | Freq: Once | INTRAVENOUS | Status: AC
  Administered 2021-09-03: 500 [IU] via INTRAVENOUS

## 2021-09-03 NOTE — Patient Instructions (Signed)
Rehydration, Adult Rehydration is the replacement of body fluids, salts, and minerals (electrolytes) that are lost during dehydration. Dehydration is when there is not enough water or other fluids in the body. This happens when you lose more fluids than you take in. Common causes of dehydration include: Not drinking enough fluids. This can occur when you are ill or doing activities that require a lot of energy, especially in hot weather. Conditions that cause loss of water or other fluids, such as diarrhea, vomiting, sweating, or urinating a lot. Other illnesses, such as fever or infection. Certain medicines, such as those that remove excess fluid from the body (diuretics). Symptoms of mild or moderate dehydration may include thirst, dry lips and mouth, and dizziness. Symptoms of severe dehydration may include increased heart rate, confusion, fainting, and not urinating. For severe dehydration, you may need to get fluids through an IV at the hospital. For mild or moderate dehydration, you can usually rehydrate at home by drinking certain fluids as told by your health care provider. What are the risks? Generally, rehydration is safe. However, taking in too much fluid (overhydration) can be a problem. This is rare. Overhydration can cause an electrolyte imbalance, kidney failure, or a decrease in salt (sodium) levels in the body. Supplies needed You will need an oral rehydration solution (ORS) if your health care provider tells you to use one. This is a drink to treat dehydration. It can be found in pharmacies and retail stores. How to rehydrate Fluids Follow instructions from your health care provider for rehydration. The kind of fluid and the amount you should drink depend on your condition. In general, you should choose drinks that you prefer. If told by your health care provider, drink an ORS. Make an ORS by following instructions on the package. Start by drinking small amounts, about  cup (120  mL) every 5-10 minutes. Slowly increase how much you drink until you have taken the amount recommended by your health care provider. Drink enough clear fluids to keep your urine pale yellow. If you were told to drink an ORS, finish it first, then start slowly drinking other clear fluids. Drink fluids such as: Water. This includes sparkling water and flavored water. Drinking only water can lead to having too little sodium in your body (hyponatremia). Follow the advice of your health care provider. Water from ice chips you suck on. Fruit juice with water you add to it (diluted). Sports drinks. Hot or cold herbal teas. Broth-based soups. Milk or milk products. Food Follow instructions from your health care provider about what to eat while you rehydrate. Your health care provider may recommend that you slowly begin eating regular foods in small amounts. Eat foods that contain a healthy balance of electrolytes, such as bananas, oranges, potatoes, tomatoes, and spinach. Avoid foods that are greasy or contain a lot of sugar. In some cases, you may get nutrition through a feeding tube that is passed through your nose and into your stomach (nasogastric tube, or NG tube). This may be done if you have uncontrolled vomiting or diarrhea. Beverages to avoid Certain beverages may make dehydration worse. While you rehydrate, avoid drinking alcohol. How to tell if you are recovering from dehydration You may be recovering from dehydration if: You are urinating more often than before you started rehydrating. Your urine is pale yellow. Your energy level improves. You vomit less frequently. You have diarrhea less frequently. Your appetite improves or returns to normal. You feel less dizzy or less light-headed. Your  skin tone and color start to look more normal. Follow these instructions at home: Take over-the-counter and prescription medicines only as told by your health care provider. Do not take sodium  tablets. Doing this can lead to having too much sodium in your body (hypernatremia). Contact a health care provider if: You continue to have symptoms of mild or moderate dehydration, such as: Thirst. Dry lips. Slightly dry mouth. Dizziness. Dark urine or less urine than normal. Muscle cramps. You continue to vomit or have diarrhea. Get help right away if you: Have symptoms of dehydration that get worse. Have a fever. Have a severe headache. Have been vomiting and the following happens: Your vomiting gets worse or does not go away. Your vomit includes blood or green matter (bile). You cannot eat or drink without vomiting. Have problems with urination or bowel movements, such as: Diarrhea that gets worse or does not go away. Blood in your stool (feces). This may cause stool to look black and tarry. Not urinating, or urinating only a small amount of very dark urine, within 6-8 hours. Have trouble breathing. Have symptoms that get worse with treatment. These symptoms may represent a serious problem that is an emergency. Do not wait to see if the symptoms will go away. Get medical help right away. Call your local emergency services (911 in the [place] Do not drive yourself to the hospital. Summary Rehydration is the replacement of body fluids and minerals (electrolytes) that are lost during dehydration. Follow instructions from your health care provider for rehydration. The kind of fluid and amount you should drink depend on your condition. Slowly increase how much you drink until you have taken the amount recommended by your health care provider. Contact your health care provider if you continue to show signs of mild or moderate dehydration. This information is not intended to replace advice given to you by your health care provider. Make sure you discuss any questions you have with your health care provider. Document Revised: 11/26/2019 Document Reviewed: 10/06/2019 Elsevier Patient  Education  2022 [place].   Pegfilgrastim Injection What is this medication? PEGFILGRASTIM (PEG fil gra stim) lowers the risk of infection in people who are receiving chemotherapy. It works by Building control surveyor make more white blood cells, which protects your body from infection. It may also be used to help people who have been exposed to high doses of radiation. This medicine may be used for other purposes; ask your health care provider or pharmacist if you have questions. COMMON BRAND NAME(S): Rexene Edison, Ziextenzo What should I tell my care team before I take this medication? They need to know if you have any of these conditions: Kidney disease Latex allergy Ongoing radiation therapy Sickle cell disease Skin reactions to acrylic adhesives (On-Body Injector only) An unusual or allergic reaction to pegfilgrastim, filgrastim, other medications, foods, dyes, or preservatives Pregnant or trying to get pregnant Breast-feeding How should I use this medication? This medication is for injection under the skin. If you get this medication at home, you will be taught how to prepare and give the pre-filled syringe or how to use the On-body Injector. Refer to the patient Instructions for Use for detailed instructions. Use exactly as directed. Tell your care team immediately if you suspect that the On-body Injector may not have performed as intended or if you suspect the use of the On-body Injector resulted in a missed or partial dose. It is important that you put your used needles and syringes  in a special sharps container. Do not put them in a trash can. If you do not have a sharps container, call your pharmacist or care team to get one. Talk to your care team about the use of this medication in children. While this medication may be prescribed for selected conditions, precautions do apply. Overdosage: If you think you have taken too much of this medicine contact a poison  control center or emergency room at once. NOTE: This medicine is only for you. Do not share this medicine with others. What if I miss a dose? It is important not to miss your dose. Call your care team if you miss your dose. If you miss a dose due to an On-body Injector failure or leakage, a new dose should be administered as soon as possible using a single prefilled syringe for manual use. What may interact with this medication? Interactions have not been studied. This list may not describe all possible interactions. Give your health care provider a list of all the medicines, herbs, non-prescription drugs, or dietary supplements you use. Also tell them if you smoke, drink alcohol, or use illegal drugs. Some items may interact with your medicine. What should I watch for while using this medication? Your condition will be monitored carefully while you are receiving this medication. You may need blood work done while you are taking this medication. Talk to your care team about your risk of cancer. You may be more at risk for certain types of cancer if you take this medication. If you are going to need a MRI, CT scan, or other procedure, tell your care team that you are using this medication (On-Body Injector only). What side effects may I notice from receiving this medication? Side effects that you should report to your care team as soon as possible: Allergic reactions--skin rash, itching, hives, swelling of the face, lips, tongue, or throat Capillary leak syndrome--stomach or muscle pain, unusual weakness or fatigue, feeling faint or lightheaded, decrease in the amount of urine, swelling of the ankles, hands, or feet, trouble breathing High white blood cell level--fever, fatigue, trouble breathing, night sweats, change in vision, weight loss Inflammation of the aorta--fever, fatigue, back, chest, or stomach pain, severe headache Kidney injury (glomerulonephritis)--decrease in the amount of urine, red  or dark brown urine, foamy or bubbly urine, swelling of the ankles, hands, or feet Shortness of breath or trouble breathing Spleen injury--pain in upper left stomach or shoulder Unusual bruising or bleeding Side effects that usually do not require medical attention (report to your care team if they continue or are bothersome): Bone pain Pain in the hands or feet This list may not describe all possible side effects. Call your doctor for medical advice about side effects. You may report side effects to FDA at 1-800-FDA-1088. Where should I keep my medication? Keep out of the reach of children. If you are using this medication at home, you will be instructed on how to store it. Throw away any unused medication after the expiration date on the label. NOTE: This sheet is a summary. It may not cover all possible information. If you have questions about this medicine, talk to your doctor, pharmacist, or health care provider.  2022 Elsevier/[employer] Standard (2021-06-14 00:00:00)

## 2021-09-05 ENCOUNTER — Other Ambulatory Visit: Payer: Self-pay

## 2021-09-05 ENCOUNTER — Ambulatory Visit: Payer: BC Managed Care – PPO | Attending: General Surgery

## 2021-09-05 ENCOUNTER — Telehealth: Payer: Self-pay | Admitting: Radiation Oncology

## 2021-09-05 VITALS — Wt 121.5 lb

## 2021-09-05 DIAGNOSIS — Z483 Aftercare following surgery for neoplasm: Secondary | ICD-10-CM | POA: Insufficient documentation

## 2021-09-05 NOTE — Telephone Encounter (Signed)
Called pt to sch consultation with Dr. Isidore Moos. No answer, LVM for pt to return call.

## 2021-09-05 NOTE — Therapy (Signed)
[facility] @ [facility] [place], [place], [address] Phone: [phone]   Fax:  [phone]  Physical Therapy Treatment  Patient Details  Name: Sophia Moore MRN: [id number] Date of Birth: 07-Sep-1981 Referring Provider (PT): Dr. Wyvonnia Lora   Encounter Date: 09/05/2021   PT End of Session - 09/05/21 0915     Visit Number 8   # unchanged due to screen only   PT Start Time 0912    PT Stop Time 0916    PT Time Calculation (min) 4 min    Activity Tolerance Patient tolerated treatment well    Behavior During Therapy [facility] for tasks assessed/performed             Past Medical History:  Diagnosis Date   Depression    takes Citalopram daily   Family history of breast cancer    Thyroid nodule 07/2016    Past Surgical History:  Procedure Laterality Date   IR IMAGING GUIDED PORT INSERTION  06/27/2021   MASTECTOMY W/ SENTINEL NODE BIOPSY Right 06/01/2021   Procedure: RIGHT MASTECTOMY WITH AXILLARY SENTINEL LYMPH NODE BIOPSY;  Surgeon: Rolm Bookbinder, MD;  Location: [facility];  Service: General;  Laterality: Right;   THYROIDECTOMY  09/13/2016   THYROIDECTOMY N/A 09/13/2016   Procedure: TOTAL THYROIDECTOMY;  Surgeon: Leta Baptist, MD;  Location: [facility];  Service: ENT;  Laterality: N/A;   wisdom teeth extracted      WISDOM TOOTH EXTRACTION      Vitals:   09/05/21 0912  Weight: 121 lb 8 oz (55.1 kg)     Subjective Assessment - 09/05/21 0913     Subjective Pt returns for her 3 month L-Dex screen.    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 She underwent a right mastectomy and sentinel node biopsy (2 of 2 positive nodes) on 06/01/2021.  It is ER/PR positive and HER2 negative with a Ki67 of 5%. She had a thyroidectomy in 2017.                    L-DEX FLOWSHEETS - 09/05/21 0900       L-DEX LYMPHEDEMA  SCREENING   Measurement Type Unilateral    L-DEX MEASUREMENT EXTREMITY Upper Extremity    POSITION  Standing    DOMINANT SIDE Right    At Risk Side Right    BASELINE SCORE (UNILATERAL) 4.5    L-DEX SCORE (UNILATERAL) 1.2    VALUE CHANGE (UNILAT) -3.3                                     PT Long Term Goals - 08/02/21 0947       PT LONG TERM GOAL #1   Title Patient will demonstrate she has regained full shoulder ROM and function post operatively compared to bsaelines.    Status Achieved      PT LONG TERM GOAL #2   Title Patient will increase right shoulder active flexion to >/= 140 degrees for increased ease reaching overhead.    Status Achieved      PT LONG TERM GOAL #3   Title Patient will increase right shoulder active abduction to >/= 150 degrees for increased ease obtaining radiation positioning.    Status Achieved      PT LONG TERM GOAL #4   Title Patient will verbalize good  understanding of lymphedema risk reduction practices.    Status Achieved                   Plan - 09/05/21 0916     Clinical Impression Statement Pt returns for her 3 month L-Dex screen. Her change from basline of -3.3 is WNLs so no further treatment is required at this time except to cont every 3 month L-Dex screens which pt is agreeable to.    PT Next Visit Plan Cont every 3 month L-Dex screens for up to 2 years from her SLNb (~05/2023)    Consulted and Agree with Plan of Care Patient             Patient will benefit from skilled therapeutic intervention in order to improve the following deficits and impairments:     Visit Diagnosis: Aftercare following surgery for neoplasm     Problem List Patient Active Problem List   Diagnosis Date Noted   S/P mastectomy, right 06/01/2021   Genetic testing 05/11/2021   Family history of breast cancer 05/04/2021   Malignant neoplasm of upper-outer quadrant of right breast in female, estrogen receptor positive  ([place]) 05/02/2021   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

## 2021-09-06 ENCOUNTER — Inpatient Hospital Stay: Payer: BC Managed Care – PPO

## 2021-09-06 ENCOUNTER — Other Ambulatory Visit: Payer: Self-pay | Admitting: Adult Health

## 2021-09-06 ENCOUNTER — Encounter: Payer: Self-pay | Admitting: *Deleted

## 2021-09-06 VITALS — BP 96/60 | HR 65 | Temp 98.6°F | Resp 16

## 2021-09-06 DIAGNOSIS — C50411 Malignant neoplasm of upper-outer quadrant of right female breast: Secondary | ICD-10-CM | POA: Diagnosis not present

## 2021-09-06 DIAGNOSIS — Z170 Estrogen receptor positive status [ER+]: Secondary | ICD-10-CM

## 2021-09-06 MED ORDER — SODIUM CHLORIDE 0.9 % IV SOLN: Freq: Once | INTRAVENOUS | Status: AC

## 2021-09-06 MED ORDER — HEPARIN SOD (PORK) LOCK FLUSH 100 UNIT/ML IV SOLN
500.0000 [IU] | Freq: Once | INTRAVENOUS | Status: AC
  Administered 2021-09-06: 500 [IU] via INTRAVENOUS

## 2021-09-06 MED ORDER — SODIUM CHLORIDE 0.9% FLUSH
10.0000 mL | Freq: Once | INTRAVENOUS | Status: AC
  Administered 2021-09-06: 10 mL via INTRAVENOUS

## 2021-09-06 NOTE — Patient Instructions (Signed)
Rehidratacin en los adultos Rehydration, Adult La rehidratacin es la reposicin de los lquidos, sales y minerales del cuerpo (electrolitos) que se pierden durante la deshidratacin. La deshidratacin se da cuando hay una cantidad insuficiente de agua u otros lquidos en el organismo. Esto ocurre cuando se pierden ms lquidos de los que se ingieren. [place] causas frecuentes de deshidratacin, se incluyen: No beber la cantidad suficiente de lquidos. Esto puede suceder cuando se enferma o hace actividades que requieren mucha energa, especialmente cuando hace calor. Afecciones que [employer] se pierda agua u otros lquidos, [place], vmitos, o sudar u Education administrator. Otras enfermedades, como fiebre o infeccin. Determinados medicamentos, como aquellos que eliminan el exceso de lquido del cuerpo (diurticos). Los sntomas de la deshidratacin leve o moderada pueden incluir sed, sequedad de los labios y de la boca, y Tree surgeon. Los sntomas de la deshidratacin grave pueden incluir aumento de la frecuencia cardaca, confusin, desmayos e imposibilidad de Garment/textile technologist. En caso de deshidratacin grave, es posible que deba recibir lquidos por va intravenosa en el hospital. En caso de deshidratacin leve o moderada, generalmente puede rehidratarse en su casa tomando ciertos lquidos que le haya indicado el mdico. Cules son los riesgos? Por lo general, la rehidratacin es segura. Sin embargo, la ingesta excesiva de lquido (hiperhidratacin) puede ser un problema. Esto es poco frecuente. La hiperhidratacin puede causar un desequilibrio de los electrolitos, insuficiencia renal o una disminucin de la concentracin de sal (sodio) en el cuerpo. Materiales necesarios Necesitar una solucin de rehidratacin oral (SRO) si el mdico se lo indica. Se trata de una bebida que es para tratar la deshidratacin. Se puede conseguir en farmacias y tiendas. Cmo rehidratarse [place] instrucciones del mdico  con respecto a Neurosurgeon. El tipo y la cantidad de lquido que debe beber dependen de su afeccin. En general, debe elegir las bebidas que prefiera. Si se lo indic el mdico, tome una SRO. Para preparar una SRO, siga las instrucciones del envase. Comience por beber pequeas cantidades, aproximadamente  taza (120 ml) cada 5 a 10 minutos. Aumente lentamente la cantidad que bebe hasta que haya ingerido la cantidad recomendada por el mdico. Beba suficiente lquidos transparentes como para mantener la orina de color amarillo plido. Si le indicaron que beba una SRO, termnela primero y [place] [employer] a beber lentamente otros lquidos transparentes. Beba lquidos, por ejemplo: Agua. Esto incluye agua con gas y agua saborizada. Si bebe solo agua, puede disminuir mucho la cantidad de sodio en el cuerpo (hiponatremia). Siga el consejo del mdico. Agua de trocitos de hielo que usted succiona. Jugo de frutas rebajado con agua (diluido). Bebidas deportivas. Ts de hierbas calientes o fros. Sopas a base de caldo. Leche o productos lcteos. [place] instrucciones del mdico respecto de lo que puede comer [place] se Electrical engineer. El mdico puede recomendarle que comience a comer despacio alimentos habituales en pequeas cantidades. Consuma los alimentos que contienen un equilibrio saludable de Brewing technologist, como las bananas, las [place], las papas, los tomates y Nurse, mental health. Evite los alimentos grasos y los muy azucarados. En algunos casos, puede alimentarse a travs de una sonda de alimentacin que se coloca a travs de la nariz hasta el estmago (sonda nasogstrica o sonda NG). Esto puede hacerse si tiene vmitos o diarrea que no pueden controlarse. Bebidas que se deben evitar Ciertas bebidas pueden empeorar la deshidratacin. Mientras se rehidrata, evite consumir alcohol. Cmo saber si se est recuperando de la deshidratacin Se puede estar recuperando de la deshidratacin si:  Orina con  ms frecuencia que antes de Wellsite geologist. Su orina es de [employer] plido. Mejora el nivel de [place]. Vomita con menos frecuencia. Tiene diarrea con menos frecuencia. El apetito mejora o vuelve a la normalidad. Se siente menos mareado o disminuye la sensacin de desvanecimiento. El color y el tono de la piel comienzan a lucir ms normales. Siga estas instrucciones en su casa: Use los medicamentos de venta libre y los recetados solamente como se lo haya indicado el mdico. No tome comprimidos de sodio. Hacer esto puede aumentar la concentracin de sodio en el organismo (hipernatremia). Comunquese con un mdico si: Contina teniendo sntomas de deshidratacin leve o moderada, por ejemplo: Sed. Labios secos. Sequedad leve en la boca. Mareos. Orina oscura o menos orina que lo normal. Calambres musculares. Sigue con vmitos o diarrea. Busque ayuda de inmediato si: Tiene sntomas de deshidratacin que empeoran. Tiene fiebre. Tiene un dolor de cabeza intenso. Est con vmitos y sucede lo siguiente: Los vmitos empeoran o no desaparecen. El vmito tiene sangre o una sustancia verde (bilis). No puede comer ni beber sin vomitar. Tiene problemas con la miccin o las deposiciones, por ejemplo: Diarrea que empeora o que no desaparece. Sangre en la materia fecal (heces). Esto puede hacer que la materia fecal sea negra y de aspecto alquitranado. No orina u orina solamente una pequea cantidad de color muy oscuro en el trmino de 6 a 8 horas. Tiene dificultad para respirar. Tiene sntomas que empeoran con [employer]. Estos sntomas pueden representar un problema grave que constituye Engineer, maintenance (IT). No espere a ver si los sntomas desaparecen. Solicite atencin mdica de inmediato. Comunquese con el servicio de emergencias de su localidad (911 en los [place]). No conduzca por sus propios medios [employer]. Resumen La rehidratacin es la reposicin de los  lquidos y minerales del cuerpo (electrolitos) que se pierden durante la deshidratacin. Siga las instrucciones del mdico con respecto a Neurosurgeon. El tipo y la cantidad de lquido que debe beber dependen de su afeccin. Aumente lentamente la cantidad que bebe hasta que haya ingerido la cantidad recomendada por el mdico. Comunquese con el mdico si sigue presentando signos de deshidratacin leve o moderada. Esta informacin no tiene Marine scientist el consejo del mdico. Asegrese de hacerle al mdico cualquier pregunta que tenga. Document Revised: 01/05/2020 Document Reviewed: 01/05/2020 Elsevier Patient Education  [place].

## 2021-09-06 NOTE — Progress Notes (Signed)
Pt is here today for IVF per MD note from 08/31/2021. There were no orders placed for IVFs in order review, Supportive Therapy Plan, or her Treatment plan.  RN added order to the [facility] for 510mL/hr for 1 hour per previous IVF orders from 09/03/2021 infusion appt.  RN made Wilber Bihari, NP aware.

## 2021-09-06 NOTE — Progress Notes (Signed)
Orders placed for IV fluids post chemo.

## 2021-09-07 ENCOUNTER — Inpatient Hospital Stay: Payer: BC Managed Care – PPO

## 2021-09-07 ENCOUNTER — Other Ambulatory Visit: Payer: Self-pay

## 2021-09-07 VITALS — BP 104/75 | HR 70 | Temp 98.7°F | Resp 16

## 2021-09-07 DIAGNOSIS — C50411 Malignant neoplasm of upper-outer quadrant of right female breast: Secondary | ICD-10-CM | POA: Diagnosis not present

## 2021-09-07 DIAGNOSIS — Z95828 Presence of other vascular implants and grafts: Secondary | ICD-10-CM

## 2021-09-07 MED ORDER — HEPARIN SOD (PORK) LOCK FLUSH 100 UNIT/ML IV SOLN
500.0000 [IU] | Freq: Once | INTRAVENOUS | Status: AC
  Administered 2021-09-07: 500 [IU] via INTRAVENOUS

## 2021-09-07 MED ORDER — SODIUM CHLORIDE 0.9% FLUSH
10.0000 mL | INTRAVENOUS | Status: DC | PRN
  Administered 2021-09-07: 10 mL via INTRAVENOUS

## 2021-09-07 MED ORDER — SODIUM CHLORIDE 0.9 % IV SOLN: Freq: Once | INTRAVENOUS | Status: AC

## 2021-09-07 NOTE — Patient Instructions (Signed)
Rehidratacin en los adultos Rehydration, Adult La rehidratacin es la reposicin de los lquidos, sales y minerales del cuerpo (electrolitos) que se pierden durante la deshidratacin. La deshidratacin se da cuando hay una cantidad insuficiente de agua u otros lquidos en el organismo. Esto ocurre cuando se pierden ms lquidos de los que se ingieren. [place] causas frecuentes de deshidratacin, se incluyen: No beber la cantidad suficiente de lquidos. Esto puede suceder cuando se enferma o hace actividades que requieren mucha energa, especialmente cuando hace calor. Afecciones que [employer] se pierda agua u otros lquidos, [place], vmitos, o sudar u Education administrator. Otras enfermedades, como fiebre o infeccin. Determinados medicamentos, como aquellos que eliminan el exceso de lquido del cuerpo (diurticos). Los sntomas de la deshidratacin leve o moderada pueden incluir sed, sequedad de los labios y de la boca, y Tree surgeon. Los sntomas de la deshidratacin grave pueden incluir aumento de la frecuencia cardaca, confusin, desmayos e imposibilidad de Garment/textile technologist. En caso de deshidratacin grave, es posible que deba recibir lquidos por va intravenosa en el hospital. En caso de deshidratacin leve o moderada, generalmente puede rehidratarse en su casa tomando ciertos lquidos que le haya indicado el mdico. Cules son los riesgos? Por lo general, la rehidratacin es segura. Sin embargo, la ingesta excesiva de lquido (hiperhidratacin) puede ser un problema. Esto es poco frecuente. La hiperhidratacin puede causar un desequilibrio de los electrolitos, insuficiencia renal o una disminucin de la concentracin de sal (sodio) en el cuerpo. Materiales necesarios Necesitar una solucin de rehidratacin oral (SRO) si el mdico se lo indica. Se trata de una bebida que es para tratar la deshidratacin. Se puede conseguir en farmacias y tiendas. Cmo rehidratarse [place] instrucciones del mdico  con respecto a Neurosurgeon. El tipo y la cantidad de lquido que debe beber dependen de su afeccin. En general, debe elegir las bebidas que prefiera. Si se lo indic el mdico, tome una SRO. Para preparar una SRO, siga las instrucciones del envase. Comience por beber pequeas cantidades, aproximadamente  taza (120 ml) cada 5 a 10 minutos. Aumente lentamente la cantidad que bebe hasta que haya ingerido la cantidad recomendada por el mdico. Beba suficiente lquidos transparentes como para mantener la orina de color amarillo plido. Si le indicaron que beba una SRO, termnela primero y  [employer] [place] [employer] a beber lentamente otros lquidos transparentes. Beba lquidos, por ejemplo: Agua. Esto incluye agua con gas y agua saborizada. Si bebe solo agua, puede disminuir mucho la cantidad de sodio en el cuerpo (hiponatremia). Siga el consejo del mdico. Agua de trocitos de hielo que usted succiona. Jugo de frutas rebajado con agua (diluido). Bebidas deportivas. Ts de hierbas calientes o fros. [place]opas a base de caldo. Leche o productos lcteos. [place] instrucciones del mdico respecto de lo que puede comer [place] se Electrical engineer. El mdico puede recomendarle que comience a comer despacio alimentos habituales en pequeas cantidades. Consuma los alimentos que contienen un equilibrio saludable de Brewing technologist, como las bananas, las [place], las papas, los tomates y Nurse, mental health. Evite los alimentos grasos y los muy azucarados. En algunos casos, puede alimentarse a travs de una sonda de alimentacin que se coloca a travs de la nariz hasta el estmago (sonda nasogstrica o sonda NG). Esto puede hacerse si tiene vmitos o diarrea que no pueden controlarse. Bebidas que se deben evitar Ciertas bebidas pueden empeorar la deshidratacin. Mientras se rehidrata, evite consumir alcohol. Cmo saber si se est recuperando de la deshidratacin Se puede estar recuperando de la deshidratacin si:  Orina con  ms frecuencia que antes de Wellsite geologist. Su orina es de [employer] plido. Mejora el nivel de [place]. Vomita con menos frecuencia. Tiene diarrea con menos frecuencia. El apetito mejora o vuelve a la normalidad. Se siente menos mareado o disminuye la sensacin de desvanecimiento. El color y el tono de la piel comienzan a lucir ms normales. Siga estas instrucciones en su casa: Use los medicamentos de venta libre y los recetados solamente como se lo haya indicado el mdico. No tome comprimidos de sodio. Hacer esto puede aumentar la concentracin de sodio en el organismo (hipernatremia). Comunquese con un mdico si: Contina teniendo sntomas de deshidratacin leve o moderada, por ejemplo: Sed. Labios secos. Sequedad leve en la boca. Mareos. Orina oscura o menos orina que lo normal. Calambres musculares. Sigue con vmitos o diarrea. Busque ayuda de inmediato si: Tiene sntomas de deshidratacin que empeoran. Tiene fiebre. Tiene un dolor de cabeza intenso. Est con vmitos y sucede lo siguiente: Los vmitos empeoran o no desaparecen. El vmito tiene sangre o una sustancia verde (bilis). No puede comer ni beber sin vomitar. Tiene problemas con la miccin o las deposiciones, por ejemplo: Diarrea que empeora o que no desaparece. Sangre en la materia fecal (heces). Esto puede hacer que la materia fecal sea negra y de aspecto alquitranado. No orina u orina solamente una pequea cantidad de color muy oscuro en el trmino de 6 a 8 horas. Tiene dificultad para respirar. Tiene sntomas que empeoran con [employer]. Estos sntomas pueden representar un problema grave que constituye Engineer, maintenance (IT). No espere a ver si los sntomas desaparecen. Solicite atencin mdica de inmediato. Comunquese con el servicio de emergencias de su localidad (911 en los [place]). No conduzca por sus propios medios [employer]. Resumen La rehidratacin es la reposicin de los  lquidos y minerales del cuerpo (electrolitos) que se pierden durante la deshidratacin. Siga las instrucciones del mdico con respecto a Neurosurgeon. El tipo y la cantidad de lquido que debe beber dependen de su afeccin. Aumente lentamente la cantidad que bebe hasta que haya ingerido la cantidad recomendada por el mdico. Comunquese con el mdico si sigue presentando signos de deshidratacin leve o moderada. Esta informacin no tiene Marine scientist el consejo del mdico. Asegrese de hacerle al mdico cualquier pregunta que tenga. Document Revised: 01/05/2020 Document Reviewed: 01/05/2020 Elsevier Patient Education  [place].

## 2021-09-08 ENCOUNTER — Telehealth: Payer: Self-pay | Admitting: Oncology

## 2021-09-08 NOTE — Telephone Encounter (Signed)
Sch per 11/21 los, pt aware 

## 2021-09-10 ENCOUNTER — Encounter: Payer: Self-pay | Admitting: Oncology

## 2021-09-10 LAB — COMPREHENSIVE METABOLIC PANEL
ALT: 108 IU/L — ABNORMAL HIGH (ref 0–32)
AST: 93 IU/L — ABNORMAL HIGH (ref 0–40)
Albumin/Globulin Ratio: 1.9 (ref 1.2–2.2)
Albumin: 3.8 g/dL (ref 3.8–4.8)
Alkaline Phosphatase: 130 IU/L — ABNORMAL HIGH (ref 44–121)
BUN/Creatinine Ratio: 18 (ref 9–23)
BUN: 12 mg/dL (ref 6–24)
Bilirubin Total: 0.3 mg/dL (ref 0.0–1.2)
CO2: 26 mmol/L (ref 20–29)
Calcium: 9.1 mg/dL (ref 8.7–10.2)
Chloride: 102 mmol/L (ref 96–106)
Creatinine, Ser: 0.68 mg/dL (ref 0.57–1.00)
Globulin, Total: 2 g/dL (ref 1.5–4.5)
Glucose: 66 mg/dL — ABNORMAL LOW (ref 70–99)
Potassium: 4.3 mmol/L (ref 3.5–5.2)
Sodium: 143 mmol/L (ref 134–144)
Total Protein: 5.8 g/dL — ABNORMAL LOW (ref 6.0–8.5)
eGFR: 113 mL/min/{1.73_m2} (ref >59)

## 2021-09-10 LAB — T4, FREE: Free T4: 1.1 ng/dL (ref 0.82–1.77)

## 2021-09-10 LAB — TSH: TSH: 8.8 u[IU]/mL — ABNORMAL HIGH (ref 0.450–4.500)

## 2021-09-12 ENCOUNTER — Telehealth: Payer: Self-pay | Admitting: *Deleted

## 2021-09-12 ENCOUNTER — Other Ambulatory Visit: Payer: Self-pay

## 2021-09-12 ENCOUNTER — Other Ambulatory Visit: Payer: Self-pay | Admitting: *Deleted

## 2021-09-12 ENCOUNTER — Inpatient Hospital Stay: Payer: BC Managed Care – PPO

## 2021-09-12 DIAGNOSIS — C50411 Malignant neoplasm of upper-outer quadrant of right female breast: Secondary | ICD-10-CM | POA: Insufficient documentation

## 2021-09-12 DIAGNOSIS — Z823 Family history of stroke: Secondary | ICD-10-CM | POA: Insufficient documentation

## 2021-09-12 DIAGNOSIS — Z510 Encounter for antineoplastic radiation therapy: Secondary | ICD-10-CM | POA: Insufficient documentation

## 2021-09-12 DIAGNOSIS — R5383 Other fatigue: Secondary | ICD-10-CM | POA: Insufficient documentation

## 2021-09-12 DIAGNOSIS — Z8349 Family history of other endocrine, nutritional and metabolic diseases: Secondary | ICD-10-CM | POA: Insufficient documentation

## 2021-09-12 DIAGNOSIS — Z170 Estrogen receptor positive status [ER+]: Secondary | ICD-10-CM | POA: Insufficient documentation

## 2021-09-12 DIAGNOSIS — Z79810 Long term (current) use of selective estrogen receptor modulators (SERMs): Secondary | ICD-10-CM | POA: Insufficient documentation

## 2021-09-12 DIAGNOSIS — Z880 Allergy status to penicillin: Secondary | ICD-10-CM | POA: Insufficient documentation

## 2021-09-12 DIAGNOSIS — Z803 Family history of malignant neoplasm of breast: Secondary | ICD-10-CM | POA: Insufficient documentation

## 2021-09-12 DIAGNOSIS — Z79899 Other long term (current) drug therapy: Secondary | ICD-10-CM | POA: Diagnosis not present

## 2021-09-12 LAB — COMPREHENSIVE METABOLIC PANEL
ALT: 153 U/L — ABNORMAL HIGH (ref 0–44)
AST: 88 U/L — ABNORMAL HIGH (ref 15–41)
Albumin: 3.6 g/dL (ref 3.5–5.0)
Alkaline Phosphatase: 132 U/L — ABNORMAL HIGH (ref 38–126)
Anion gap: 10 (ref 5–15)
BUN: 15 mg/dL (ref 6–20)
CO2: 24 mmol/L (ref 22–32)
Calcium: 8.9 mg/dL (ref 8.9–10.3)
Chloride: 108 mmol/L (ref 98–111)
Creatinine, Ser: 0.72 mg/dL (ref 0.44–1.00)
GFR, Estimated: >60 mL/min (ref >60)
Glucose, Bld: 100 mg/dL — ABNORMAL HIGH (ref 70–99)
Potassium: 3.7 mmol/L (ref 3.5–5.1)
Sodium: 142 mmol/L (ref 135–145)
Total Bilirubin: 0.4 mg/dL (ref 0.3–1.2)
Total Protein: 6.8 g/dL (ref 6.5–8.1)

## 2021-09-12 LAB — URINALYSIS, COMPLETE (UACMP) WITH MICROSCOPIC
Bilirubin Urine: NEGATIVE
Glucose, UA: NEGATIVE mg/dL
Ketones, ur: NEGATIVE mg/dL
Nitrite: NEGATIVE
Protein, ur: NEGATIVE mg/dL
Specific Gravity, Urine: 1.006 (ref 1.005–1.030)
pH: 6 (ref 5.0–8.0)

## 2021-09-12 LAB — CBC WITH DIFFERENTIAL (CANCER CENTER ONLY)
Abs Immature Granulocytes: 1.01 10*3/uL — ABNORMAL HIGH (ref 0.00–0.07)
Basophils Absolute: 0.1 10*3/uL (ref 0.0–0.1)
Basophils Relative: 1 %
Eosinophils Absolute: 0 10*3/uL (ref 0.0–0.5)
Eosinophils Relative: 0 %
HCT: 36.8 % (ref 36.0–46.0)
Hemoglobin: 12.5 g/dL (ref 12.0–15.0)
Immature Granulocytes: 7 %
Lymphocytes Relative: 13 %
Lymphs Abs: 2 10*3/uL (ref 0.7–4.0)
MCH: 30.3 pg (ref 26.0–34.0)
MCHC: 34 g/dL (ref 30.0–36.0)
MCV: 89.1 fL (ref 80.0–100.0)
Monocytes Absolute: 0.7 10*3/uL (ref 0.1–1.0)
Monocytes Relative: 5 %
Neutro Abs: 11.6 10*3/uL — ABNORMAL HIGH (ref 1.7–7.7)
Neutrophils Relative %: 74 %
Platelet Count: 256 10*3/uL (ref 150–400)
RBC: 4.13 MIL/uL (ref 3.87–5.11)
RDW: 15.2 % (ref 11.5–15.5)
WBC Count: 15.5 10*3/uL — ABNORMAL HIGH (ref 4.0–10.5)
nRBC: 0.6 % — ABNORMAL HIGH (ref 0.0–0.2)

## 2021-09-12 LAB — PREGNANCY, URINE: Preg Test, Ur: NEGATIVE

## 2021-09-12 NOTE — Telephone Encounter (Signed)
Labs obtained today and reviewed by MD - with recommendation for pt to increase fluids per urination concerns presently.  Discomfort may be from the pegfilgrastin.  Culture is pending.

## 2021-09-13 LAB — URINE CULTURE: Culture: NO GROWTH

## 2021-09-13 NOTE — Progress Notes (Signed)
Radiation Oncology         [phone]) [phone] ________________________________  Name: Sophia Moore MRN: [id number]  Date: 09/14/2021  DOB: [birth year]  Follow-Up Visit Note  Outpatient  CC: [place]  Magrinat, Virgie Dad, MD  Diagnosis:      ICD-10-CM   1. 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     Since breast clinic consultation date on 05/04/21, the patient underwent genetic testing on 05/04/21 which revealed no clinically significant variants detected from the [facility] panel. However, a variant of unknown significance was detected on the +RNAinsight panel.  MammaPrint obtained from the 04/26/2021 biopsy returned low risk luminal type-A, , with a predicted benefit from additional chemotherapy in the 1-2% range (given the patient's young age however, the benefit of chemotherapy might be as high as 5%).  The patient opted to proceed with right breast mastectomy on the date of 06/01/21. 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  Systemic therapy, if applicable, involved (dates and therapy as follows): adjuvant chemotherapy consisting of docetaxel, with cyclophosphamide given on day 1 of a 21 day cycle x 4 from 06/30/2021 through 09/01/2021, under the care of Dr. Jana Hakim.  Pertinent imaging this far includes CT of the abdomen and pelvis on 07/06/21 (prompted by abdominal pain for 2 days) which showed findings consistent with acute proctocolitis. No focal fluid collection was appreciated to suggest an abscess.   Lymphedema issues, if any:  Patient denies    Pain issues, if any:  Patient denies   SAFETY ISSUES: Prior radiation? No Pacemaker/ICD? No Possible current pregnancy? No--LMP: 04/13/2021 (her husband is status postvasectomy and recent pregnancy test was negative) Is the patient on methotrexate? No  She expresses concerns about the risk of stomach cancer while on tamoxifen.  She is not sure when to resume the tamoxifen        ALLERGIES:  is allergic to amoxicillin and penicillins.  Meds: Current Outpatient Medications  Medication Sig Dispense Refill   cholecalciferol (VITAMIN D3) 25 MCG (1000 UNIT) tablet Take 1,000 Units by mouth daily.     ciprofloxacin (CIPRO) 500 MG tablet Take 1 tablet (500 mg total) by mouth 2 (two) times daily. 40 tablet 0   citalopram (CELEXA) 20 MG tablet Take 20 mg by mouth daily.     colestipol (COLESTID) 5 g granules TAKE 5 GRAMS BY MOUTH 2 (TWO) TIMES DAILY. 500 g 0   dexamethasone (DECADRON) 4 MG tablet Take 2 tablets (8 mg total) by  mouth 2 (two) times daily. Start the day before Taxotere. Then again the day after chemo for 3 days. 30 tablet 1   levothyroxine (SYNTHROID) 88 MCG tablet TAKE 1 TABLET BY MOUTH EVERY DAY BEFORE BREAKFAST (Patient taking differently: Take 88 mcg by mouth daily before breakfast.) 90 tablet 1   lidocaine-prilocaine  (EMLA) cream Apply to affected area once (Patient taking differently: Apply 1 application topically once as needed (port access).) 30 g 3   loperamide (IMODIUM) 2 MG capsule Take 2 mg by mouth as needed for diarrhea or loose stools.     methocarbamol (ROBAXIN) 750 MG tablet Take 1 tablet (750 mg total) by mouth 4 (four) times daily as needed (use for muscle cramps/pain). 30 tablet 0   oxyCODONE (OXY IR/ROXICODONE) 5 MG immediate release tablet Take 1 tablet (5 mg total) by mouth every 6 (six) hours as needed. 10 tablet 0   prochlorperazine (COMPAZINE) 10 MG tablet Take 1 tablet (10 mg total) by mouth every 6 (six) hours as needed (Nausea or vomiting). 30 tablet 1   tamoxifen (NOLVADEX) 20 MG tablet Take 20 mg by mouth daily.     No current facility-administered medications for this encounter.    Physical Findings:  height is '5\' 1"'  (1.549 m) and weight is 122 lb (55.3 kg). Her temperature is 97.7 F (36.5 C). Her blood pressure is 102/72 and her pulse is 68. Her respiration is 18 and oxygen saturation is 100%. .     General: Alert and oriented, in no acute distress Heart: Regular in rate and rhythm with no murmurs, rubs, or gallops. Chest: Clear to auscultation bilaterally, with no rhonchi, wheezes, or rales. Psychiatric: Judgment and insight are intact. Affect is appropriate. Breast exam reveals status post right mastectomy with no sign of local progression.  Scar has healed satisfactorily  Lab Findings: Lab Results  Component Value Date   WBC 15.5 (H) 09/12/2021   HGB 12.5 09/12/2021   HCT 36.8 09/12/2021   MCV 89.1 09/12/2021   PLT 256 09/12/2021      Radiographic Findings: No results found.  Impression/Plan: This is a very pleasant 40 year old woman with locally advanced ER positive right breast cancer status postmastectomy.  She did not undergo reconstructive surgery   We discussed adjuvant radiotherapy today.  I recommend 6 weeks of radiation therapy to the right chest wall  and regional nodes in order to reduce risk of local regional recurrence by two thirds.  I reviewed the logistics, benefits, risks, and potential side effects of this treatment in detail. Risks may include but not necessary be limited to acute and late injury tissue in the radiation fields such as skin irritation (change in color/pigmentation, itching, dryness, pain, peeling). She may experience fatigue. We also discussed possible risk of long term cosmetic changes or scar tissue. There is also a smaller risk for lung toxicity,  brachial plexopathy, lymphedema, musculoskeletal changes, rib fragility or induction of a second malignancy, late chronic non-healing soft tissue wound.    The patient asked good questions which I answered to her satisfaction. She is enthusiastic about proceeding with treatment. A consent form has been signed and placed in her chart.   We will proceed with treatment planning later this week and start her treatment about a week thereafter.  Regarding her concerns about the risk of stomach cancer from tamoxifen I let her know that to my knowledge this is a very small risk.  I encouraged her to talk about this further with medical oncology.  I let her know that tamoxifen is very important for her to prevent relapses and optimize her prognosis; I reiterated medical oncology's documentation that it is fine for her to hold the tamoxifen while she pursues radiation therapy.  She follows up with medical oncology later this month.  On date of service, in total, I spent 30 minutes on this encounter. Patient was seen in person.  _____________________________________   Eppie Gibson, MD  This document serves as a record of services personally performed by Eppie Gibson, MD. It was created on her behalf by Roney Mans, a trained medical scribe. The creation of this record is based on the scribe's personal observations and the provider's statements to them. This document has been checked and  approved by the attending provider.

## 2021-09-13 NOTE — Progress Notes (Signed)
Location of Breast Cancer:  Malignant neoplasm of upper-outer quadrant of right breast in female, estrogen receptor positive  Histology per Pathology Report:  06/01/2021 FINAL MICROSCOPIC DIAGNOSIS:  A. BREAST, RIGHT, MASTECTOMY:  -  Multifocal invasive ductal carcinoma, Nottingham grade 1 of 3, 6.5 cm  -  Ductal carcinoma in-situ, intermediate grade  -  Margins uninvolved by carcinoma  -  Invasive carcinoma (0.6 cm; posterior margin)  -  In-situ carcinoma (0.2 cm; posterior margin)  -  Previous biopsy site changes present  -  See oncology table below  B. LYMPH NODE, RIGHT AXILLARY, SENTINEL, EXCISION:  -  Metastatic carcinoma involving one lymph node (1/1)  -  Extracapsular extension present (0.4 cm)  C. LYMPH NODE, RIGHT AXILLARY, SENTINEL, EXCISION:  -  Metastatic carcinoma involving one lymph node (1/1)   Receptor Status: ER(95%), PR (70%), Her2-neu (Negative via FISH), Ki-67(5%)  Did patient present with symptoms (if so, please note symptoms) or was this found on screening mammography?: Patient had routine screening mammography (her first ever) on 03/22/2021 showing a possible abnormality in the right breast. She underwent right diagnostic mammography with tomography and right breast ultrasonography at [facility] on 04/19/2021 showing: breast density category C; palpable 6.3 cm mass involving the central and upper half of the right breast; 2-4 satellite nodules within upper-outer quadrant; no right axillary adenopathy.  Past/Anticipated interventions by surgeon, if any:  06/01/2021 --Dr. Rolm Bookbinder 1.  Right total mastectomy 2.  Injection of tracer for sentinel lymph node identification 3.  Right deep axillary sentinel lymph node biopsy  Past/Anticipated interventions by medical oncology, if any:  Under care of Dr. Sarajane Jews Magrinat 08/31/2021 Genetics testing 05/16/2021 through the [facility] panel A variant of uncertain significance (VUS) was detected in the  [facility] gene called p.R1369H (c.4106G>A) MammaPrint obtained from the 04/26/2021 biopsy returned luminal a type, low risk, with a predicted benefit from additional chemotherapy in the 1-2% range [given the patient's young age however, the benefit of chemotherapy might be as high as 5%]. status post right mastectomy and sentinel lymph node sampling 06/01/2021 for an mpT3 pN1-2, stage IIA invasive ductal carcinoma, grade 1, with negative margins a total of 2 right axillary lymph nodes were removed, both positive, 1 with extracapsular extension completion axillary dissection being considered adjuvant chemotherapy with docetaxel, cyclophosphamide given on day 1 of a 21 day cycle x 4 from 06/30/2021 through 09/01/2021 adjuvant radiation tamoxifen started 05/04/2021 in anticipation of surgical delays; held during chemotherapy I am making her a return with [place] on 10/04/2021 to check labs, make sure there are no residual side effects from chemo, and make sure she proceeds appropriately to the neck step.  Lymphedema issues, if any:  Patient denies    Pain issues, if any:  Patient denies   SAFETY ISSUES: Prior radiation? No Pacemaker/ICD? No Possible current pregnancy? No--LMP: 04/13/2021 Is the patient on methotrexate? No  Current Complaints / other details:  Nothing else of note

## 2021-09-14 ENCOUNTER — Ambulatory Visit
Admission: RE | Admit: 2021-09-14 | Discharge: 2021-09-14 | Disposition: A | Discharge status: Home / Self Care | Payer: BC Managed Care – PPO | Source: Ambulatory Visit | Attending: Radiation Oncology | Admitting: Radiation Oncology

## 2021-09-14 ENCOUNTER — Other Ambulatory Visit: Payer: Self-pay

## 2021-09-14 ENCOUNTER — Encounter: Payer: Self-pay | Admitting: Radiation Oncology

## 2021-09-14 VITALS — BP 102/72 | HR 68 | Temp 97.7°F | Resp 18 | Ht 61.0 in | Wt 122.0 lb

## 2021-09-14 DIAGNOSIS — Z170 Estrogen receptor positive status [ER+]: Secondary | ICD-10-CM | POA: Diagnosis not present

## 2021-09-14 DIAGNOSIS — R109 Unspecified abdominal pain: Secondary | ICD-10-CM | POA: Insufficient documentation

## 2021-09-14 DIAGNOSIS — Z9011 Acquired absence of right breast and nipple: Secondary | ICD-10-CM | POA: Insufficient documentation

## 2021-09-14 DIAGNOSIS — C50411 Malignant neoplasm of upper-outer quadrant of right female breast: Secondary | ICD-10-CM | POA: Insufficient documentation

## 2021-09-14 DIAGNOSIS — Z79899 Other long term (current) drug therapy: Secondary | ICD-10-CM | POA: Insufficient documentation

## 2021-09-15 ENCOUNTER — Encounter: Payer: Self-pay | Admitting: "Endocrinology

## 2021-09-15 ENCOUNTER — Ambulatory Visit (INDEPENDENT_AMBULATORY_CARE_PROVIDER_SITE_OTHER): Payer: BC Managed Care – PPO | Admitting: "Endocrinology

## 2021-09-15 VITALS — BP 94/68 | HR 80 | Ht 61.0 in | Wt 122.0 lb

## 2021-09-15 DIAGNOSIS — E890 Postprocedural hypothyroidism: Secondary | ICD-10-CM

## 2021-09-15 MED ORDER — LEVOTHYROXINE SODIUM 100 MCG PO TABS: 100.0000 ug | ORAL_TABLET | Freq: Every day | ORAL | 3 refills | Status: DC

## 2021-09-15 NOTE — Progress Notes (Signed)
40/05/2021  Endocrinology follow-up note   Subjective:    Patient ID: Sophia Moore, female    DOB: May 10, 1981, PCP [place]   Past Medical History:  Diagnosis Date   Depression    takes Citalopram daily   Family history of breast cancer    Thyroid nodule 07/2016   Past Surgical History:  Procedure Laterality Date   IR IMAGING GUIDED PORT INSERTION  06/27/2021   MASTECTOMY W/ SENTINEL NODE BIOPSY Right 06/01/2021   Procedure: RIGHT MASTECTOMY WITH AXILLARY SENTINEL LYMPH NODE BIOPSY;  Surgeon: Rolm Bookbinder, MD;  Location: [facility];  Service: General;  Laterality: Right;   THYROIDECTOMY  09/13/2016   THYROIDECTOMY N/A 09/13/2016   Procedure: TOTAL THYROIDECTOMY;  Surgeon: Leta Baptist, MD;  Location: [facility] OR;  Service: ENT;  Laterality: N/A;   wisdom teeth extracted      WISDOM TOOTH EXTRACTION     Social History   Socioeconomic History   Marital status: Married    Spouse name: Not on file   Number of children: Not on file   Years of education: Not on file   Highest education level: Not on file  Occupational History   Not on file  Tobacco Use   Smoking status: Never   Smokeless tobacco: Never  Vaping Use   Vaping Use: Never used  Substance and Sexual Activity   Alcohol use: No   Drug use: No   Sexual activity: Not Currently    Birth control/protection: None  Other Topics Concern   Not on file  Social History Narrative   Not on file   Social Determinants of Health   Financial Resource Strain: High Risk   Difficulty of Paying Living Expenses: Very hard  Food Insecurity: Not on file  Transportation Needs: No Transportation Needs   Lack of Transportation (Medical): No   Lack of Transportation (Non-Medical): No  Physical Activity: Not on file  Stress: Not on file  Social Connections: Not on file   Outpatient Encounter Medications as of 09/15/2021  Medication Sig   levothyroxine (SYNTHROID) 100 MCG  tablet Take 1 tablet (100 mcg total) by mouth daily.   cholecalciferol (VITAMIN D3) 25 MCG (1000 UNIT) tablet Take 1,000 Units by mouth daily.   citalopram (CELEXA) 20 MG tablet Take 20 mg by mouth daily.   dexamethasone (DECADRON) 4 MG tablet Take 2 tablets (8 mg total) by mouth 2 (two) times daily. Start the day before Taxotere. Then again the day after chemo for 3 days.   lidocaine-prilocaine (EMLA) cream Apply to affected area once (Patient taking differently: Apply 1 application topically once as needed (port access).)   loperamide (IMODIUM) 2 MG capsule Take 2 mg by mouth as needed for diarrhea or loose stools. (Patient not taking: Reported on 09/15/2021)   prochlorperazine (COMPAZINE) 10 MG tablet Take 1 tablet (10 mg total) by mouth every 6 (six) hours as needed (Nausea or vomiting). (Patient not taking: Reported on 09/15/2021)   tamoxifen (NOLVADEX) 20 MG tablet Take 20 mg by mouth daily.   [DISCONTINUED] ciprofloxacin (CIPRO) 500 MG tablet Take 1 tablet (500 mg total) by mouth 2 (two) times daily.   [DISCONTINUED] colestipol (COLESTID) 5 g granules TAKE 5 GRAMS BY MOUTH 2 (TWO) TIMES DAILY.   [DISCONTINUED] levothyroxine (SYNTHROID) 88 MCG tablet TAKE 1 TABLET BY MOUTH EVERY DAY BEFORE BREAKFAST (Patient taking differently: Take 88 mcg by mouth daily before breakfast.)   [DISCONTINUED] methocarbamol (ROBAXIN) 750 MG tablet Take 1 tablet (750 mg  total) by mouth 4 (four) times daily as needed (use for muscle cramps/pain).   [DISCONTINUED] oxyCODONE (OXY IR/ROXICODONE) 5 MG immediate release tablet Take 1 tablet (5 mg total) by mouth every 6 (six) hours as needed.   No facility-administered encounter medications on file as of 09/15/2021.   ALLERGIES: Allergies  Allergen Reactions   Amoxicillin Rash   Penicillins Rash    Has patient had a PCN reaction causing immediate rash, facial/tongue/throat swelling, SOB or lightheadedness with hypotension: Yes Has patient had a PCN reaction causing  severe rash involving mucus membranes or skin necrosis: No Has patient had a PCN reaction that required hospitalization No Has patient had a PCN reaction occurring within the last 10 years: Yes If all of the above answers are "NO", then may proceed with Cephalosporin use.    VACCINATION STATUS: Immunization History  Administered Date(s) Administered   PPD Test 04/24/2016   Tdap 07/02/2020    HPI 40 year old female patient with medical history as above. 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 - Her surgical pathology was negative for malignancy.  She is currently on thyroid hormone replacement, levothyroxine 88 mcg p.o. daily before breakfast.   In the interim, she was diagnosed with breast cancer s/p surgery and chemotherapy.  She is awaiting for radiation therapy. She reports symptoms of fatigue, and few pounds of weight gain. She denies palpitations, tremors, nor heat intolerance.  - She denies family history of thyroid dysfunction. She denies exposure to neck radiation. She denies heat/cold intolerance. She has steady body weight. Denies dysphagia, shortness of breath, nor voice change.  Review of Systems  Constitutional:  + Slightly fluctuating heart rate,  no fatigue, no subjective hyperthermia/hypothermia   Objective:    BP 94/68   Pulse 80   Ht 5\' 1"  (1.549 m)   Wt 122 lb (55.3 kg)   BMI 23.05 kg/m   Wt Readings from Last 3 Encounters:  09/15/21 122 lb (55.3 kg)  09/14/21 122 lb (55.3 kg)  09/05/21 121 lb 8 oz (55.1 kg)      Physical Exam- Limited  Constitutional:  Body mass index is 23.05 kg/m. , not in acute distress, normal state of mind       09/13/2016 surgical pathology of total thyroidectomy-no malignancy. Thyroid, thyroidectomy, Right - FOLLICULAR ADENOMA WITH CALCIFICATIONS, 2.1 CM. - MICROSCOPIC INTRATHYROIDAL PARATHYROID TISSUE. - THERE IS NO EVIDENCE OF  MALIGNANCY.  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  January 29, 2018 thyroid/neck ultrasound: Thyroid surgically absent with no evidence of remnant or recurrence.   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    Her surgical sample diagnosis showed no malignancy.    - She did not require radioactive iodine ablative therapy.   -Regarding her postsurgical hypothyroidism: -Her thyroid function tests are consistent with under replacement.  She would benefit from a higher dose of levothyroxine.  I discussed and increase her levothyroxine to 100 mcg p.o. daily before breakfast.     - We discussed about the correct intake of her thyroid hormone, on  empty stomach at fasting, with water, separated by at least 30 minutes from breakfast and other medications,  and separated by more than 4 hours from calcium, iron, multivitamins, acid reflux medications (PPIs). -Patient is made aware of the fact that thyroid hormone replacement is needed for life, dose to be adjusted by periodic monitoring of thyroid function tests.  She is encouraged to maintain close follow-up with her oncology service.  - I advised patient to maintain close follow up with [facility] for primary care needs.    I spent 21 minutes in the care of the patient today including review of labs from Thyroid Function, CMP, and other relevant labs ; imaging/biopsy records (current and previous including abstractions from other facilities); face-to-face time discussing  her lab results and  symptoms, medications doses, her options of short and long term treatment based on the latest standards of care / guidelines;   and documenting the encounter.  [employer]  participated in the discussions, expressed understanding, and voiced agreement with the above plans.  All questions were answered to her satisfaction. she is encouraged to contact clinic should she have any questions or concerns prior to her return visit.   Follow up plan: Return in about 3 months (around 12/14/2021) for F/U with Pre-visit Labs.  Glade Lloyd, MD Phone: [phone]  Fax: [phone]  -  This note was partially dictated with voice recognition software. Similar sounding words can be transcribed inadequately or may not  be corrected upon review.  09/15/2021, 4:55 PM

## 2021-09-16 ENCOUNTER — Other Ambulatory Visit: Payer: Self-pay

## 2021-09-16 ENCOUNTER — Ambulatory Visit
Admission: RE | Admit: 2021-09-16 | Discharge: 2021-09-16 | Disposition: A | Discharge status: Home / Self Care | Payer: BC Managed Care – PPO | Source: Ambulatory Visit | Attending: Radiation Oncology | Admitting: Radiation Oncology

## 2021-09-16 DIAGNOSIS — Z510 Encounter for antineoplastic radiation therapy: Secondary | ICD-10-CM | POA: Diagnosis not present

## 2021-09-20 DIAGNOSIS — Z510 Encounter for antineoplastic radiation therapy: Secondary | ICD-10-CM | POA: Diagnosis not present

## 2021-09-22 ENCOUNTER — Encounter: Payer: Self-pay | Admitting: *Deleted

## 2021-09-26 ENCOUNTER — Ambulatory Visit
Admission: RE | Admit: 2021-09-26 | Discharge: 2021-09-26 | Disposition: A | Discharge status: Home / Self Care | Payer: BC Managed Care – PPO | Source: Ambulatory Visit | Attending: Radiation Oncology | Admitting: Radiation Oncology

## 2021-09-26 ENCOUNTER — Other Ambulatory Visit: Payer: Self-pay

## 2021-09-26 DIAGNOSIS — C50411 Malignant neoplasm of upper-outer quadrant of right female breast: Secondary | ICD-10-CM

## 2021-09-26 DIAGNOSIS — Z510 Encounter for antineoplastic radiation therapy: Secondary | ICD-10-CM | POA: Diagnosis not present

## 2021-09-26 MED ORDER — RADIAPLEXRX EX GEL: Freq: Once | CUTANEOUS | Status: AC

## 2021-09-26 MED ORDER — ALRA NON-METALLIC DEODORANT (RAD-ONC)
1.0000 "application " | Freq: Once | TOPICAL | Status: AC
  Administered 2021-09-26: 1 via TOPICAL

## 2021-09-26 NOTE — Progress Notes (Signed)
Pt here for patient teaching.    Pt given Radiation and You booklet, skin care instructions, Alra deodorant, and Radiaplex gel.    Reviewed areas of pertinence such as fatigue, hair loss, skin changes, breast tenderness, and breast swelling .   Pt able to give teach back of to pat skin, use unscented/gentle soap, and use baby wipes,apply Radiaplex bid, avoid applying anything to skin within 4 hours of treatment, avoid wearing an under wire bra, and to use an electric razor if they must shave.   Pt verbalizes understanding of information given and will contact nursing with any questions or concerns.    [email or web address]

## 2021-09-27 ENCOUNTER — Ambulatory Visit
Admission: RE | Admit: 2021-09-27 | Discharge: 2021-09-27 | Disposition: A | Discharge status: Home / Self Care | Payer: BC Managed Care – PPO | Source: Ambulatory Visit | Attending: Radiation Oncology | Admitting: Radiation Oncology

## 2021-09-27 DIAGNOSIS — Z510 Encounter for antineoplastic radiation therapy: Secondary | ICD-10-CM | POA: Diagnosis not present

## 2021-09-28 ENCOUNTER — Other Ambulatory Visit: Payer: Self-pay

## 2021-09-28 ENCOUNTER — Ambulatory Visit
Admission: RE | Admit: 2021-09-28 | Discharge: 2021-09-28 | Disposition: A | Discharge status: Home / Self Care | Payer: BC Managed Care – PPO | Source: Ambulatory Visit | Attending: Radiation Oncology | Admitting: Radiation Oncology

## 2021-09-28 DIAGNOSIS — Z510 Encounter for antineoplastic radiation therapy: Secondary | ICD-10-CM | POA: Diagnosis not present

## 2021-09-29 ENCOUNTER — Ambulatory Visit
Admission: RE | Admit: 2021-09-29 | Discharge: 2021-09-29 | Disposition: A | Discharge status: Home / Self Care | Payer: BC Managed Care – PPO | Source: Ambulatory Visit | Attending: Radiation Oncology | Admitting: Radiation Oncology

## 2021-09-29 DIAGNOSIS — Z510 Encounter for antineoplastic radiation therapy: Secondary | ICD-10-CM | POA: Diagnosis not present

## 2021-09-30 ENCOUNTER — Ambulatory Visit
Admission: RE | Admit: 2021-09-30 | Discharge: 2021-09-30 | Disposition: A | Discharge status: Home / Self Care | Payer: BC Managed Care – PPO | Source: Ambulatory Visit | Attending: Radiation Oncology | Admitting: Radiation Oncology

## 2021-09-30 ENCOUNTER — Other Ambulatory Visit: Payer: Self-pay

## 2021-09-30 DIAGNOSIS — Z510 Encounter for antineoplastic radiation therapy: Secondary | ICD-10-CM | POA: Diagnosis not present

## 2021-10-04 ENCOUNTER — Ambulatory Visit
Admission: RE | Admit: 2021-10-04 | Discharge: 2021-10-04 | Disposition: A | Discharge status: Home / Self Care | Payer: BC Managed Care – PPO | Source: Ambulatory Visit | Attending: Radiation Oncology | Admitting: Radiation Oncology

## 2021-10-04 ENCOUNTER — Inpatient Hospital Stay: Payer: BC Managed Care – PPO | Admitting: Hematology and Oncology

## 2021-10-04 ENCOUNTER — Other Ambulatory Visit: Payer: Self-pay

## 2021-10-04 ENCOUNTER — Encounter: Payer: Self-pay | Admitting: Hematology and Oncology

## 2021-10-04 ENCOUNTER — Inpatient Hospital Stay: Payer: BC Managed Care – PPO

## 2021-10-04 VITALS — BP 100/71 | HR 81 | Temp 98.1°F | Resp 18 | Ht 61.0 in | Wt 120.8 lb

## 2021-10-04 DIAGNOSIS — C50411 Malignant neoplasm of upper-outer quadrant of right female breast: Secondary | ICD-10-CM | POA: Diagnosis not present

## 2021-10-04 DIAGNOSIS — Z170 Estrogen receptor positive status [ER+]: Secondary | ICD-10-CM

## 2021-10-04 DIAGNOSIS — Z510 Encounter for antineoplastic radiation therapy: Secondary | ICD-10-CM | POA: Diagnosis not present

## 2021-10-04 DIAGNOSIS — Z95828 Presence of other vascular implants and grafts: Secondary | ICD-10-CM | POA: Diagnosis not present

## 2021-10-04 DIAGNOSIS — Z9011 Acquired absence of right breast and nipple: Secondary | ICD-10-CM

## 2021-10-04 LAB — COMPREHENSIVE METABOLIC PANEL
ALT: 38 U/L (ref 0–44)
AST: 28 U/L (ref 15–41)
Albumin: 4.1 g/dL (ref 3.5–5.0)
Alkaline Phosphatase: 71 U/L (ref 38–126)
Anion gap: 6 (ref 5–15)
BUN: 13 mg/dL (ref 6–20)
CO2: 30 mmol/L (ref 22–32)
Calcium: 9.7 mg/dL (ref 8.9–10.3)
Chloride: 107 mmol/L (ref 98–111)
Creatinine, Ser: 0.57 mg/dL (ref 0.44–1.00)
GFR, Estimated: >60 mL/min (ref >60)
Glucose, Bld: 70 mg/dL (ref 70–99)
Potassium: 3.8 mmol/L (ref 3.5–5.1)
Sodium: 143 mmol/L (ref 135–145)
Total Bilirubin: 0.7 mg/dL (ref 0.3–1.2)
Total Protein: 6.6 g/dL (ref 6.5–8.1)

## 2021-10-04 LAB — CBC WITH DIFFERENTIAL/PLATELET
Abs Immature Granulocytes: 0.01 K/uL (ref 0.00–0.07)
Basophils Absolute: 0 K/uL (ref 0.0–0.1)
Basophils Relative: 1 %
Eosinophils Absolute: 0.1 K/uL (ref 0.0–0.5)
Eosinophils Relative: 1 %
HCT: 40.2 % (ref 36.0–46.0)
Hemoglobin: 13.4 g/dL (ref 12.0–15.0)
Immature Granulocytes: 0 %
Lymphocytes Relative: 15 %
Lymphs Abs: 0.9 K/uL (ref 0.7–4.0)
MCH: 30 pg (ref 26.0–34.0)
MCHC: 33.3 g/dL (ref 30.0–36.0)
MCV: 89.9 fL (ref 80.0–100.0)
Monocytes Absolute: 0.5 K/uL (ref 0.1–1.0)
Monocytes Relative: 8 %
Neutro Abs: 4.4 K/uL (ref 1.7–7.7)
Neutrophils Relative %: 75 %
Platelets: 289 K/uL (ref 150–400)
RBC: 4.47 MIL/uL (ref 3.87–5.11)
RDW: 14.5 % (ref 11.5–15.5)
WBC: 5.8 K/uL (ref 4.0–10.5)
nRBC: 0 % (ref 0.0–0.2)

## 2021-10-04 LAB — PREGNANCY, URINE: Preg Test, Ur: NEGATIVE

## 2021-10-04 NOTE — Progress Notes (Signed)
Sophia Moore  [phone] [phone]    ID: Sophia Moore DOB: 10-Aug-1981  MR#: [id number]  AST#:[id number]  Patient Care Team: [facility] as PCP - General (Family Medicine) Sophia Kaufmann, RN as Oncology Nurse Navigator Sophia Germany, RN as Oncology Nurse Navigator Sophia Bookbinder, MD as Consulting Physician (General Surgery) Sophia Moore, Sophia Dad, MD as Consulting Physician (Oncology) Sophia Gibson, MD as Attending Physician (Radiation Oncology) Sophia Pike, MD  OTHER MD:  CHIEF COMPLAINT: Estrogen receptor positive breast cancer  CURRENT TREATMENT: adjvuant chemotherapy   INTERVAL HISTORY: Sophia Moore returns today for follow up and treatment of her estrogen receptor positive breast cancer.  She completed adjuvant chemotherapy on 08/31/2021. Since she completed chemotherapy, she continues to feel better.  She still has some tiredness when she tries to do any activities but overall she is improving.  She is currently undergoing adjuvant radiation, today's fraction #6.  She denies any tingling or numbness from chemotherapy.  She denies any other residual nausea, vomiting or diarrhea. Rest of the pertinent 10 point ROS reviewed and negative.  REVIEW OF SYSTEMS:   COVID 19 VACCINATION STATUS: not vaccinated, infection 05/2019 (results in Sophia Moore)   HISTORY OF CURRENT ILLNESS: From the original intake note:  Sophia Moore had routine screening mammography (her first ever) on 03/22/2021 showing a possible abnormality in the right breast. She underwent right diagnostic mammography with tomography and right breast ultrasonography at [facility] on 04/19/2021 showing: breast density category C; palpable 6.3 cm mass involving the central and upper half of the right breast; 2-4 satellite nodules within upper-outer quadrant; no right axillary adenopathy.  Accordingly on 04/26/2021 she proceeded  to biopsy of the right breast area in question. 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 Proliferation marker Ki67 at 5%. HER2 equivocal by immunohistochemistry (2+), but negative by fluorescent in situ hybridization with a signals ratio 1.2 and number per cell 1.8. 2. Right Breast, upper-outer axillary tail  - flat epithelial atypia with calcifications   Cancer Staging  Malignant neoplasm of upper-outer quadrant of right breast in female, estrogen receptor positive ([place]) Staging form: Breast, AJCC 8th Edition - Clinical stage from 05/04/2021: Stage IIA (cT3, cN0, cM0, G2, ER+, PR+, HER2-) - Signed by Sophia Cruel, MD on 05/04/2021 Stage prefix: Initial diagnosis Histologic grading system: 3 grade system Laterality: Right Staged by: Pathologist and managing physician Stage used in treatment planning: Yes National guidelines used in treatment planning: Yes Type of national guideline used in treatment planning: NCCN   The patient's subsequent history is as detailed below.  PAST MEDICAL HISTORY: Past Medical History:  Diagnosis Date   Depression    takes Citalopram daily   Family history of breast cancer    Thyroid nodule 07/2016    PAST SURGICAL HISTORY: Past Surgical History:  Procedure Laterality Date   IR IMAGING GUIDED PORT INSERTION  06/27/2021   MASTECTOMY W/ SENTINEL NODE BIOPSY Right 06/01/2021   Procedure: RIGHT MASTECTOMY WITH AXILLARY SENTINEL LYMPH NODE BIOPSY;  Surgeon: Sophia Bookbinder, MD;  Location: [facility];  Service: General;  Laterality: Right;   THYROIDECTOMY  09/13/2016   THYROIDECTOMY N/A 09/13/2016   Procedure: TOTAL THYROIDECTOMY;  Surgeon: Leta Baptist, MD;  Location: [facility];  Service: ENT;  Laterality: N/A;   wisdom teeth extracted      WISDOM TOOTH EXTRACTION      FAMILY HISTORY: Family History  Problem Relation Age of Onset   Hyperlipidemia Mother    Stroke Mother    Breast cancer Maternal Aunt        dx 40s   Her parents are both living, her father age 40 and her mother age 95, as of 04/2021. Sophia Moore has two brothers and three sisters. She reports breast cancer in a maternal aunt (age 65+).   GYNECOLOGIC HISTORY:  No LMP recorded. Menarche: unsure Age at first live birth: 39 years old [place] P 3 LMP 04/13/2021 Contraceptive never used HRT n/a  Hysterectomy? no BSO? 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  She is originally from the [place] section of [place].  Husband Sophia Moore work in Architect. She lives at home with daughter Sophia Moore, age 80, and [name], age 7. Her oldest [name], age 14, works at [employer]. She attends a [employer].    ADVANCED DIRECTIVES: In the absence of any documentation to the contrary, the patient's spouse is their HCPOA.    HEALTH MAINTENANCE: Social History   Tobacco Use   Smoking status: Never   Smokeless tobacco: Never  Vaping Use   Vaping Use: Never used  Substance Use Topics   Alcohol use: No   Drug use: No     Colonoscopy: n/a (age)  PAP: 03/2021  Bone density: n/a (age)   Allergies  Allergen Reactions   Amoxicillin Rash   Penicillins Rash    Has patient had a PCN reaction causing immediate rash, facial/tongue/throat swelling, SOB or lightheadedness with hypotension: Yes Has patient had a PCN reaction causing severe rash involving mucus membranes or skin necrosis: No Has patient had a PCN reaction that required hospitalization No Has patient had a PCN reaction occurring within the last 10 years: Yes If all of the above answers are "NO", then may  proceed with Cephalosporin use.     Current Outpatient Medications  Medication Sig Dispense Refill   cholecalciferol (VITAMIN D3) 25 MCG (1000 UNIT) tablet Take 1,000 Units by mouth daily.     citalopram (CELEXA) 20 MG tablet Take 20 mg by mouth daily.     dexamethasone (DECADRON) 4 MG tablet Take 2 tablets (8 mg total) by mouth 2 (two) times daily. Start the day before Taxotere. Then again the day after chemo for 3 days. 30 tablet 1   levothyroxine (SYNTHROID) 100 MCG tablet Take 1 tablet (100 mcg total) by mouth daily. 90 tablet 3   lidocaine-prilocaine (EMLA) cream Apply to affected area once (Patient taking differently: Apply 1 application topically once as needed (port access).) 30 g 3   loperamide (IMODIUM) 2 MG capsule Take 2 mg by mouth as needed for diarrhea or loose stools. (Patient not taking: Reported on 09/15/2021)     prochlorperazine (COMPAZINE) 10 MG tablet Take 1 tablet (10 mg total) by mouth every 6 (six) hours as needed (Nausea or vomiting). (Patient not taking: Reported on 09/15/2021) 30 tablet 1   tamoxifen (NOLVADEX) 20 MG tablet Take 20 mg by mouth daily.     No current facility-administered medications for this visit.    OBJECTIVE: Spanish speaker in no acute distress  Vitals:   10/04/21 1141  BP: 100/71  Pulse: 81  Resp:  18  Temp: 98.1 F (36.7 C)  SpO2: 100%     Body mass index is 22.82 kg/m.   Wt Readings from Last 3 Encounters:  10/04/21 120 lb 12.8 oz (54.8 kg)  09/15/21 122 lb (55.3 kg)  09/14/21 122 lb (55.3 kg)  ECOG FS:1 - Symptomatic but completely ambulatory  Sclerae unicteric, EOMs intact Wearing a mask No cervical or supraclavicular adenopathy Lungs no rales or rhonchi Heart regular rate and rhythm Abd soft, nontender, positive bowel sounds MSK no focal spinal tenderness, no upper extremity lymphedema Neuro: nonfocal, well oriented, appropriate affect Breasts: Deferred   LAB RESULTS:  CMP     Component Value Date/Time   NA 142  09/12/2021 1238   NA 143 09/09/2021 0834   K 3.7 09/12/2021 1238   CL 108 09/12/2021 1238   CO2 24 09/12/2021 1238   GLUCOSE 100 (H) 09/12/2021 1238   BUN 15 09/12/2021 1238   BUN 12 09/09/2021 0834   CREATININE 0.72 09/12/2021 1238   CREATININE 0.62 08/16/2021 0939   CALCIUM 8.9 09/12/2021 1238   PROT 6.8 09/12/2021 1238   PROT 5.8 (L) 09/09/2021 0834   ALBUMIN 3.6 09/12/2021 1238   ALBUMIN 3.8 09/09/2021 0834   AST 88 (H) 09/12/2021 1238   AST 16 08/16/2021 0939   ALT 153 (H) 09/12/2021 1238   ALT 32 08/16/2021 0939   ALKPHOS 132 (H) 09/12/2021 1238   BILITOT 0.4 09/12/2021 1238   BILITOT 0.3 09/09/2021 0834   BILITOT 0.4 08/16/2021 0939   GFRNONAA >60 09/12/2021 1238   GFRNONAA >60 08/16/2021 0939    No results found for: TOTALPROTELP, ALBUMINELP, A1GS, A2GS, BETS, BETA2SER, GAMS, MSPIKE, SPEI  Lab Results  Component Value Date   WBC 5.8 10/04/2021   NEUTROABS 4.4 10/04/2021   HGB 13.4 10/04/2021   HCT 40.2 10/04/2021   MCV 89.9 10/04/2021   PLT 289 10/04/2021    No results found for: LABCA2  No components found for: [id number]  No results for input(s): INR in the last 168 hours.  No results found for: LABCA2  No results found for: WUJ811  No results found for: BJY782  No results found for: NFA213  No results found for: CA2729  No components found for: HGQUANT  No results found for: CEA1 / No results found for: CEA1   No results found for: AFPTUMOR  No results found for: CHROMOGRNA  No results found for: KPAFRELGTCHN, LAMBDASER, KAPLAMBRATIO (kappa/lambda light chains)  No results found for: HGBA, HGBA2QUANT, HGBFQUANT, HGBSQUAN (Hemoglobinopathy evaluation)   No results found for: LDH  No results found for: IRON, TIBC, IRONPCTSAT (Iron and TIBC)  No results found for: FERRITIN  Urinalysis    Component Value Date/Time   COLORURINE YELLOW 09/12/2021 1238   APPEARANCEUR HAZY (A) 09/12/2021 1238   LABSPEC 1.006 09/12/2021 1238   PHURINE  6.0 09/12/2021 [place] 09/12/2021 1238   HGBUR SMALL (A) 09/12/2021 [place] 09/12/2021 [place] 09/12/2021 [place] 09/12/2021 1238   NITRITE NEGATIVE 09/12/2021 1238   LEUKOCYTESUR SMALL (A) 09/12/2021 1238    STUDIES: No results found.   ELIGIBLE FOR AVAILABLE RESEARCH PROTOCOL: no  ASSESSMENT: 40 y.o. Spanish speaker status post right breast upper outer quadrant biopsy 04/26/2021 for a clinical mT3 N0, stage IIA invasive ductal carcinoma, grade 1 or 2, estrogen and progesterone receptor positive, HER2 not amplified, with an MIB-1 of 5%.  (1) genetics testing 05/16/2021 through the [employer] [facility]  panel (report date 05/11/2021) or CancerNext-Expanded + RNAinsight panel (report date 05/16/2021).   The BRCAplus panel offered by [employer] [facility] and includes sequencing and deletion/duplication analysis for the following 8 genes: ATM, BRCA1, BRCA2, CDH1, CHEK2, PALB2, PTEN, and TP53. The CancerNext-Expanded + RNAinsight gene panel offered by [employer] [facility] and includes sequencing and rearrangement analysis for the following 77 genes: AIP, ALK, APC, ATM, AXIN2, BAP1, BARD1, BLM, BMPR1A, BRCA1, BRCA2, BRIP1, CDC73, CDH1, CDK4, CDKN1B, CDKN2A, CHEK2, CTNNA1, DICER1, FANCC, FH, FLCN, GALNT12, KIF1B, LZTR1, MAX, MEN1, MET, MLH1, MSH2, MSH3, MSH6, MUTYH, NBN, NF1, NF2, NTHL1, PALB2, PHOX2B, PMS2, POT1, PRKAR1A, PTCH1, PTEN, RAD51C, RAD51D, RB1, RECQL, RET, SDHA, SDHAF2, SDHB, SDHC, SDHD, SMAD4, SMARCA4, SMARCB1, SMARCE1, STK11, SUFU, TMEM127, TP53, TSC1, TSC2, VHL and XRCC2 (sequencing and deletion/duplication); EGFR, EGLN1, HOXB13, KIT, MITF, PDGFRA, POLD1 and POLE (sequencing only); EPCAM and GREM1 (deletion/duplication only). RNA data is routinely analyzed for use in variant interpretation for all genes.  (A) A variant of uncertain significance (VUS) was detected in the [facility] gene called p.R1369H (c.4106G>A).  (2) MammaPrint obtained  from the 04/26/2021 biopsy returned luminal a type, low risk, with a predicted benefit from additional chemotherapy in the 1-2% range [given the patient's young age however, the benefit of chemotherapy might be as high as 5%].  (3) status post right mastectomy and sentinel lymph node sampling 06/01/2021 for an mpT3 pN1-2, stage IIA invasive ductal carcinoma, grade 1, with negative margins  (A) a total of 2 right axillary lymph nodes were removed, both positive, 1 with extracapsular extension  (B) completion axillary dissection being considered  (3) adjuvant chemotherapy with docetaxel, cyclophosphamide given on day 1 of a 21 day cycle x 4 from 06/30/2021 through 09/01/2021  (4) adjuvant radiation ongoing  (5) tamoxifen started 05/04/2021 in anticipation of surgical delays; held during chemotherapy.  #6 she completed adjuvant chemotherapy on 08/31/2021   PLAN:  Patient has been recovering very well from adjuvant chemotherapy.  No major residual adverse effects noted today. She will complete adjuvant radiation and return to clinic for consideration of adjuvant antiestrogen therapy.  She was initially started on tamoxifen in anticipation of surgical release which is held currently, we will plan to resume tamoxifen when she returns.  I spent 30 minutes in the care of this patient, reviewing her history, physical examination, counseling and coordination of care including discussion about antiestrogen therapy.  *Total Encounter Time as defined by the Centers for Medicare and Medicaid Services includes, in addition to the face-to-face time of a patient visit (documented in the note above) non-face-to-face time: obtaining and reviewing outside history, ordering and reviewing medications, tests or procedures, care coordination (communications with other health care professionals or caregivers) and documentation in the medical record.  Sophia Pike MD

## 2021-10-05 ENCOUNTER — Ambulatory Visit
Admission: RE | Admit: 2021-10-05 | Discharge: 2021-10-05 | Disposition: A | Discharge status: Home / Self Care | Payer: BC Managed Care – PPO | Source: Ambulatory Visit | Attending: Radiation Oncology | Admitting: Radiation Oncology

## 2021-10-05 DIAGNOSIS — Z510 Encounter for antineoplastic radiation therapy: Secondary | ICD-10-CM | POA: Diagnosis not present

## 2021-10-06 ENCOUNTER — Ambulatory Visit
Admission: RE | Admit: 2021-10-06 | Discharge: 2021-10-06 | Disposition: A | Discharge status: Home / Self Care | Payer: BC Managed Care – PPO | Source: Ambulatory Visit | Attending: Radiation Oncology | Admitting: Radiation Oncology

## 2021-10-06 ENCOUNTER — Other Ambulatory Visit: Payer: Self-pay

## 2021-10-06 DIAGNOSIS — Z510 Encounter for antineoplastic radiation therapy: Secondary | ICD-10-CM | POA: Diagnosis not present

## 2021-10-07 ENCOUNTER — Ambulatory Visit
Admission: RE | Admit: 2021-10-07 | Discharge: 2021-10-07 | Disposition: A | Discharge status: Home / Self Care | Payer: BC Managed Care – PPO | Source: Ambulatory Visit | Attending: Radiation Oncology | Admitting: Radiation Oncology

## 2021-10-07 DIAGNOSIS — Z510 Encounter for antineoplastic radiation therapy: Secondary | ICD-10-CM | POA: Diagnosis not present

## 2021-10-10 ENCOUNTER — Other Ambulatory Visit: Payer: Self-pay

## 2021-10-10 ENCOUNTER — Ambulatory Visit
Admission: EM | Admit: 2021-10-10 | Discharge: 2021-10-10 | Disposition: A | Discharge status: Home / Self Care | Payer: BC Managed Care – PPO | Attending: Emergency Medicine | Admitting: Emergency Medicine

## 2021-10-10 DIAGNOSIS — B349 Viral infection, unspecified: Secondary | ICD-10-CM | POA: Diagnosis not present

## 2021-10-10 DIAGNOSIS — J029 Acute pharyngitis, unspecified: Secondary | ICD-10-CM | POA: Insufficient documentation

## 2021-10-10 DIAGNOSIS — C50919 Malignant neoplasm of unspecified site of unspecified female breast: Secondary | ICD-10-CM

## 2021-10-10 DIAGNOSIS — Z170 Estrogen receptor positive status [ER+]: Secondary | ICD-10-CM | POA: Diagnosis not present

## 2021-10-10 DIAGNOSIS — C50929 Malignant neoplasm of unspecified site of unspecified male breast: Secondary | ICD-10-CM

## 2021-10-10 DIAGNOSIS — C50411 Malignant neoplasm of upper-outer quadrant of right female breast: Secondary | ICD-10-CM | POA: Diagnosis not present

## 2021-10-10 DIAGNOSIS — R519 Headache, unspecified: Secondary | ICD-10-CM | POA: Diagnosis not present

## 2021-10-10 DIAGNOSIS — B309 Viral conjunctivitis, unspecified: Secondary | ICD-10-CM | POA: Diagnosis not present

## 2021-10-10 LAB — POCT RAPID STREP A (OFFICE): Rapid Strep A Screen: NEGATIVE

## 2021-10-10 MED ORDER — OSELTAMIVIR PHOSPHATE 75 MG PO CAPS: 75.0000 mg | ORAL_CAPSULE | Freq: Two times a day (BID) | ORAL | 0 refills | Status: AC

## 2021-10-10 MED ORDER — AZITHROMYCIN 250 MG PO TABS
ORAL_TABLET | ORAL | 0 refills | Status: AC
Start: 2021-10-10 — End: 2021-10-15

## 2021-10-10 MED ORDER — OLOPATADINE HCL 0.1 % OP SOLN
1.0000 [drp] | Freq: Two times a day (BID) | OPHTHALMIC | 0 refills | Status: AC
Start: 2021-10-10 — End: ?

## 2021-10-10 NOTE — Discharge Instructions (Addendum)
On physical exam today, you do not show any overt signs of bacterial infection and while I do think it is most likely that you have a viral infection, I recommend that you begin medications for both bacterial and viral infection.  I have sent prescription for Tamiflu to your pharmacy, please take 1 capsule twice daily for 5 days.  I have also sent a prescription for azithromycin to your pharmacy, please take 2 tablets today and 1 tablet daily thereafter until complete.    The rapid strep and rapid flu test that we performed in the office today were both negative.  Throat culture to evaluate you for bacterial throat infection typically takes 3 to 5 days.  Your COVID test result should be posted to your MyChart within the next 24 to 48 hours, if it is positive you will receive a phone call and Paxlovid, the COVID antiviral medication, will be prescribed for you as well.  I provided you with a prescription for Pataday eyedrops for your eye irritation, please place 1 drop in each eye twice daily until symptoms resolve.  Please reach out to your oncologist to let them know that you are not feeling well, what we discussed at our visit today and the treatment plan that I have initiated.    It is my hope that they will be okay with you going in for your radiation treatment tomorrow morning, I would certainly hate for you to miss a treatment due to delayed test results.

## 2021-10-10 NOTE — ED Provider Notes (Signed)
[facility]    CSN: [id number] Arrival date & time: 10/10/21  1211    HISTORY   Chief Complaint  Patient presents with   Headache    Sore throat     HPI Sophia Moore is a 41 y.o. female. Patient states that 2 days ago she began to have a sore throat that was a little itchy, now has body ache and headache.  Patient states she has been drinking tea with honey and cinnamon with no improvement of symptoms, has not tried any over-the-counter medications.  Patient states she developed a cough that is nonproductive of sputum all day however states she does cough up a little bit of yellowish-green mucus in the mornings.  Patient states she had this morning that when she woke up her left eye had some crusting in it and was very red, denies eye pain, itching, vision change, light sensitivity, excessive discharge from eye at this time.  Patient states she is currently undergoing daily radiation treatment for breast cancer, is not receiving concomitant chemotherapy.  Patient has a mildly elevated temperature at this time, respiratory shins, heart rate and blood pressure are normal.  The history is provided by the patient.  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    Home Medications    Prior to Admission medications   Medication Sig Start Date End Date Taking? Authorizing Provider  cholecalciferol (VITAMIN D3) 25 MCG (1000 UNIT) tablet Take 1,000 Units by mouth daily.    [provider]  citalopram (CELEXA) 20 MG tablet Take 20 mg by mouth daily.    [provider]  levothyroxine (SYNTHROID) 100 MCG tablet Take 1 tablet (100 mcg total) by mouth daily. 09/15/21   Cassandria Anger, MD  tamoxifen (NOLVADEX) 20 MG tablet Take 20 mg by mouth daily. 07/31/21   [provider]   Family History Family History  Problem Relation Age of Onset   Hyperlipidemia Mother    Stroke Mother    Breast cancer Maternal Aunt        dx 61s   Social History Social History   Tobacco Use   Smoking status: Never   Smokeless tobacco: Never  Vaping Use   Vaping Use: Never used  Substance Use Topics   Alcohol use: No   Drug use: No   Allergies  Amoxicillin and Penicillins  Review of Systems Review of Systems Pertinent findings noted in history of present illness.   Physical Exam Triage Vital Signs ED Triage Vitals  Enc Vitals Group     BP 08/05/21 0827 (!) 147/82     Pulse Rate 08/05/21 0827 72     Resp 08/05/21 0827 18     Temp 08/05/21 0827 98.3 F (36.8 C)     Temp Source 08/05/21 0827 Oral     SpO2 08/05/21 0827 98 %     Weight --      Height --      Head Circumference --      Peak Flow --      Pain Score 08/05/21 0826 5     Pain Loc --      Pain Edu? --      Excl. in [place]? --   No data found.  Updated Vital Signs BP 102/73  (BP Location: Left Arm)    Pulse 89    Temp 99 F (37.2 C) (Oral)    Resp 16    SpO2 100%   Physical Exam Vitals and nursing note reviewed.  Constitutional:      General: She is not in acute distress.    Appearance: Normal appearance. She is not ill-appearing.  HENT:     Head: Normocephalic and atraumatic.     Salivary Glands: Right salivary gland is not diffusely enlarged or tender. Left salivary gland is not diffusely enlarged or tender.     Right Ear: Tympanic membrane, ear canal and external ear normal. No drainage. No middle ear effusion. There is no impacted cerumen. Tympanic membrane is not erythematous or bulging.     Left Ear: Tympanic membrane, ear canal and external ear normal. No drainage.  No middle ear effusion. There is no impacted cerumen. Tympanic membrane is not erythematous or bulging.     Nose: Nose normal. No nasal deformity, septal deviation, mucosal edema, congestion or rhinorrhea.     Right Turbinates: Not enlarged, swollen or pale.     Left Turbinates: Not enlarged, swollen or pale.     Right Sinus: No maxillary sinus tenderness or frontal sinus tenderness.     Left Sinus: No maxillary sinus tenderness or frontal sinus tenderness.     Mouth/Throat:     Lips: Pink. No lesions.     Mouth: Mucous membranes are moist. No oral lesions.     Pharynx: Oropharynx is clear. Uvula midline. Posterior oropharyngeal erythema (Mild) present. No uvula swelling.     Tonsils: No tonsillar exudate. 0 on the right. 0 on the left.  Eyes:     General: Lids are normal.        Right eye: No discharge.        Left eye: No discharge.     Extraocular Movements: Extraocular movements intact.     Conjunctiva/sclera:     Right eye: Right conjunctiva is not injected.     Left eye: Left conjunctiva is injected.  Neck:     Trachea: Trachea and phonation normal.  Cardiovascular:     Rate and Rhythm: Normal rate and regular rhythm.     Pulses: Normal pulses.     Heart sounds: Normal heart  sounds. No murmur heard.   No friction rub. No gallop.  Pulmonary:     Effort: Pulmonary effort is normal. No accessory muscle usage, prolonged expiration or respiratory distress.     Breath sounds: Normal breath sounds. No stridor, decreased air  movement or transmitted upper airway sounds. No decreased breath sounds, wheezing, rhonchi or rales.  Chest:     Chest wall: No tenderness.  Musculoskeletal:        General: Normal range of motion.     Cervical back: Normal range of motion and neck supple. Normal range of motion.  Lymphadenopathy:     Cervical: Cervical adenopathy present.     Right cervical: Superficial cervical adenopathy, deep cervical adenopathy and posterior cervical adenopathy present.     Left cervical: Superficial cervical adenopathy, deep cervical adenopathy and posterior cervical adenopathy present.  Skin:    General: Skin is warm and dry.     Findings: No erythema or rash.  Neurological:     General: No focal deficit present.     Mental Status: She is alert and oriented to person, place, and time.  Psychiatric:        Mood and Affect: Mood normal.        Behavior: Behavior normal.    Visual Acuity Right Eye Distance:   Left Eye Distance:   Bilateral Distance:    Right Eye Near:   Left Eye Near:    Bilateral Near:     UC Couse / Diagnostics / Procedures:    EKG  Radiology No results found.  Procedures Procedures (including critical care time)  UC Diagnoses / Final Clinical Impressions(s)   I have reviewed the triage vital signs and the nursing notes.  Pertinent labs & imaging results that were available during my care of the patient were reviewed by me and considered in my medical decision making (see chart for details).   Final diagnoses:  Acute pharyngitis, unspecified etiology  Acute nonintractable headache, unspecified headache type  Viral illness  Malignant neoplasm of breast in female, estrogen receptor positive, unspecified laterality,  unspecified site of breast ([place])  Viral conjunctivitis   Patient currently undergoing radiation therapy daily for breast cancer.  Patient appears mildly ill during visit today.  Because of her radiation therapy, I believe it is best practice to treat her empirically for both viral infection and bacterial infection.  Patient provided with a prescription for Tamiflu and amoxicillin clavulanic acid.  Patient was tested for influenza, COVID-19 and streptococcal pharyngitis.  Rapid flu and rapid strep test were both negative today.  Patient has been advised to reach out to her oncologist to discuss her symptoms in this visit today.  Patient provided with an N95 in case you are okay with her coming in for her radiation therapy tomorrow morning which is certainly my hope.  ED Prescriptions     Medication Sig Dispense Auth. Provider   oseltamivir (TAMIFLU) 75 MG capsule Take 1 capsule (75 mg total) by mouth every 12 (twelve) hours for 5 days. 10 capsule Lynden Oxford Scales, PA-C   azithromycin (ZITHROMAX) 250 MG tablet Take 2 tablets (500 mg total) by mouth daily for 1 day, THEN 1 tablet (250 mg total) daily for 4 days. 6 tablet Lynden Oxford Scales, PA-C   olopatadine (PATADAY) 0.1 % ophthalmic solution Place 1 drop into both eyes 2 (two) times daily. 5 mL Lynden Oxford Scales, PA-C      PDMP not reviewed this encounter.  Pending results:  Labs Reviewed  NOVEL CORONAVIRUS, NAA  CULTURE, GROUP A STREP [facility])  POCT RAPID STREP A (OFFICE)  POCT INFLUENZA A/B    Medications Ordered in UC: Medications - No data to display  Disposition Upon Discharge:  Condition: stable for discharge home Home: take medications  as prescribed; routine discharge instructions as discussed; follow up as advised.  Patient presented with an acute illness with associated systemic symptoms and significant discomfort requiring urgent management. In my opinion, this is a condition that a prudent lay person (someone  who possesses an average knowledge of health and medicine) may potentially expect to result in complications if not addressed urgently such as respiratory distress, impairment of bodily function or dysfunction of bodily organs.   Routine symptom specific, illness specific and/or disease specific instructions were discussed with the patient and/or caregiver at length.   As such, the patient has been evaluated and assessed, work-up was performed and treatment was provided in alignment with urgent care protocols and evidence based medicine.  Patient/parent/caregiver has been advised that the patient may require follow up for further testing and treatment if the symptoms continue in spite of treatment, as clinically indicated and appropriate.  If the patient was tested for COVID-19, Influenza and/or RSV, then the patient/parent/guardian was advised to isolate at home pending the results of his/her diagnostic coronavirus test and potentially longer if theyre positive. I have also advised pt that if his/her COVID-19 test returns positive, it's recommended to self-isolate for at least 10 days after symptoms first appeared AND until fever-free for 24 hours without fever reducer AND other symptoms have improved or resolved. Discussed self-isolation recommendations as well as instructions for household member/close contacts as per the [facility] and [place] DHHS, and also gave patient the [place] packet with this information.  Patient/parent/caregiver has been advised to return to the [facility] or PCP in 3-5 days if no better; to PCP or the Emergency Department if new signs and symptoms develop, or if the current signs or symptoms continue to change or worsen for further workup, evaluation and treatment as clinically indicated and appropriate  The patient will follow up with their current PCP if and as advised. If the patient does not currently have a PCP we will assist them in obtaining one.   The patient may need specialty follow  up if the symptoms continue, in spite of conservative treatment and management, for further workup, evaluation, consultation and treatment as clinically indicated and appropriate.  Patient/parent/caregiver verbalized understanding and agreement of plan as discussed.  All questions were addressed during visit.  Please see discharge instructions below for further details of plan.  Discharge Instructions:   Discharge Instructions      On physical exam today, you do not show any overt signs of bacterial infection and while I do think it is most likely that you have a viral infection, I recommend that you begin medications for both bacterial and viral infection.  I have sent prescription for Tamiflu to your pharmacy, please take 1 capsule twice daily for 5 days.  I have also sent a prescription for azithromycin to your pharmacy, please take 2 tablets today and 1 tablet daily thereafter until complete.    The rapid strep and rapid flu test that we performed in the office today were both negative.  Throat culture to evaluate you for bacterial throat infection typically takes 3 to 5 days.  Your COVID test result should be posted to your MyChart within the next 24 to 48 hours, if it is positive you will receive a phone call and Paxlovid, the COVID antiviral medication, will be prescribed for you as well.  I provided you with a prescription for Pataday eyedrops for your eye irritation, please place 1 drop in each eye twice daily until symptoms resolve.  Please reach out to your oncologist to let them know that you are not feeling well, what we discussed at our visit today and the treatment plan that I have initiated.    It is my hope that they will be okay with you going in for your radiation treatment tomorrow morning, I would certainly hate for you to miss a treatment due to delayed test results.      This office note has been dictated using Museum/gallery curator.  Unfortunately, and  despite my best efforts, this method of dictation can sometimes lead to occasional typographical or grammatical errors.  I apologize in advance if this occurs.     Lynden Oxford Scales, PA-C 10/10/21 1446

## 2021-10-11 ENCOUNTER — Ambulatory Visit
Admission: RE | Admit: 2021-10-11 | Discharge: 2021-10-11 | Disposition: A | Discharge status: Home / Self Care | Payer: BC Managed Care – PPO | Source: Ambulatory Visit | Attending: Radiation Oncology | Admitting: Radiation Oncology

## 2021-10-11 DIAGNOSIS — Z170 Estrogen receptor positive status [ER+]: Secondary | ICD-10-CM | POA: Insufficient documentation

## 2021-10-11 DIAGNOSIS — C50411 Malignant neoplasm of upper-outer quadrant of right female breast: Secondary | ICD-10-CM | POA: Insufficient documentation

## 2021-10-11 DIAGNOSIS — Z510 Encounter for antineoplastic radiation therapy: Secondary | ICD-10-CM | POA: Insufficient documentation

## 2021-10-12 ENCOUNTER — Other Ambulatory Visit: Payer: Self-pay

## 2021-10-12 ENCOUNTER — Ambulatory Visit
Admission: RE | Admit: 2021-10-12 | Discharge: 2021-10-12 | Disposition: A | Discharge status: Home / Self Care | Payer: BC Managed Care – PPO | Source: Ambulatory Visit | Attending: Radiation Oncology | Admitting: Radiation Oncology

## 2021-10-12 DIAGNOSIS — Z510 Encounter for antineoplastic radiation therapy: Secondary | ICD-10-CM | POA: Diagnosis not present

## 2021-10-12 LAB — NOVEL CORONAVIRUS, NAA: SARS-CoV-2, NAA: NOT DETECTED

## 2021-10-12 LAB — SARS-COV-2, NAA 2 DAY TAT

## 2021-10-13 ENCOUNTER — Ambulatory Visit
Admission: RE | Admit: 2021-10-13 | Discharge: 2021-10-13 | Disposition: A | Discharge status: Home / Self Care | Payer: BC Managed Care – PPO | Source: Ambulatory Visit | Attending: Radiation Oncology | Admitting: Radiation Oncology

## 2021-10-13 DIAGNOSIS — Z510 Encounter for antineoplastic radiation therapy: Secondary | ICD-10-CM | POA: Diagnosis not present

## 2021-10-14 ENCOUNTER — Other Ambulatory Visit: Payer: Self-pay

## 2021-10-14 ENCOUNTER — Ambulatory Visit
Admission: RE | Admit: 2021-10-14 | Discharge: 2021-10-14 | Disposition: A | Discharge status: Home / Self Care | Payer: BC Managed Care – PPO | Source: Ambulatory Visit | Attending: Radiation Oncology | Admitting: Radiation Oncology

## 2021-10-14 DIAGNOSIS — Z510 Encounter for antineoplastic radiation therapy: Secondary | ICD-10-CM | POA: Diagnosis not present

## 2021-10-14 LAB — CULTURE, GROUP A STREP (THRC)

## 2021-10-17 ENCOUNTER — Other Ambulatory Visit: Payer: Self-pay

## 2021-10-17 ENCOUNTER — Ambulatory Visit
Admission: RE | Admit: 2021-10-17 | Discharge: 2021-10-17 | Disposition: A | Discharge status: Home / Self Care | Payer: BC Managed Care – PPO | Source: Ambulatory Visit | Attending: Radiation Oncology | Admitting: Radiation Oncology

## 2021-10-17 DIAGNOSIS — Z510 Encounter for antineoplastic radiation therapy: Secondary | ICD-10-CM | POA: Diagnosis not present

## 2021-10-18 ENCOUNTER — Ambulatory Visit
Admission: RE | Admit: 2021-10-18 | Discharge: 2021-10-18 | Disposition: A | Discharge status: Home / Self Care | Payer: BC Managed Care – PPO | Source: Ambulatory Visit | Attending: Radiation Oncology | Admitting: Radiation Oncology

## 2021-10-18 DIAGNOSIS — Z510 Encounter for antineoplastic radiation therapy: Secondary | ICD-10-CM | POA: Diagnosis not present

## 2021-10-19 ENCOUNTER — Ambulatory Visit
Admission: RE | Admit: 2021-10-19 | Discharge: 2021-10-19 | Disposition: A | Discharge status: Home / Self Care | Payer: BC Managed Care – PPO | Source: Ambulatory Visit | Attending: Radiation Oncology | Admitting: Radiation Oncology

## 2021-10-19 ENCOUNTER — Other Ambulatory Visit: Payer: Self-pay

## 2021-10-19 DIAGNOSIS — Z510 Encounter for antineoplastic radiation therapy: Secondary | ICD-10-CM | POA: Diagnosis not present

## 2021-10-20 ENCOUNTER — Ambulatory Visit
Admission: RE | Admit: 2021-10-20 | Discharge: 2021-10-20 | Disposition: A | Discharge status: Home / Self Care | Payer: BC Managed Care – PPO | Source: Ambulatory Visit | Attending: Radiation Oncology | Admitting: Radiation Oncology

## 2021-10-20 DIAGNOSIS — Z510 Encounter for antineoplastic radiation therapy: Secondary | ICD-10-CM | POA: Diagnosis not present

## 2021-10-21 ENCOUNTER — Ambulatory Visit
Admission: RE | Admit: 2021-10-21 | Discharge: 2021-10-21 | Disposition: A | Discharge status: Home / Self Care | Payer: BC Managed Care – PPO | Source: Ambulatory Visit | Attending: Radiation Oncology | Admitting: Radiation Oncology

## 2021-10-21 ENCOUNTER — Other Ambulatory Visit: Payer: Self-pay

## 2021-10-21 DIAGNOSIS — Z510 Encounter for antineoplastic radiation therapy: Secondary | ICD-10-CM | POA: Diagnosis not present

## 2021-10-24 ENCOUNTER — Ambulatory Visit
Admission: RE | Admit: 2021-10-24 | Discharge: 2021-10-24 | Disposition: A | Discharge status: Home / Self Care | Payer: BC Managed Care – PPO | Source: Ambulatory Visit | Attending: Radiation Oncology | Admitting: Radiation Oncology

## 2021-10-24 ENCOUNTER — Ambulatory Visit: Payer: BC Managed Care – PPO | Admitting: Radiation Oncology

## 2021-10-24 ENCOUNTER — Other Ambulatory Visit: Payer: Self-pay

## 2021-10-24 DIAGNOSIS — Z510 Encounter for antineoplastic radiation therapy: Secondary | ICD-10-CM | POA: Diagnosis not present

## 2021-10-25 ENCOUNTER — Ambulatory Visit
Admission: RE | Admit: 2021-10-25 | Discharge: 2021-10-25 | Disposition: A | Discharge status: Home / Self Care | Payer: BC Managed Care – PPO | Source: Ambulatory Visit | Attending: Radiation Oncology | Admitting: Radiation Oncology

## 2021-10-25 DIAGNOSIS — Z170 Estrogen receptor positive status [ER+]: Secondary | ICD-10-CM

## 2021-10-25 DIAGNOSIS — Z510 Encounter for antineoplastic radiation therapy: Secondary | ICD-10-CM | POA: Diagnosis not present

## 2021-10-25 MED ORDER — RADIAPLEXRX EX GEL: Freq: Once | CUTANEOUS | Status: AC

## 2021-10-26 ENCOUNTER — Ambulatory Visit
Admission: RE | Admit: 2021-10-26 | Discharge: 2021-10-26 | Disposition: A | Discharge status: Home / Self Care | Payer: BC Managed Care – PPO | Source: Ambulatory Visit | Attending: Radiation Oncology | Admitting: Radiation Oncology

## 2021-10-26 ENCOUNTER — Other Ambulatory Visit: Payer: Self-pay

## 2021-10-26 DIAGNOSIS — Z510 Encounter for antineoplastic radiation therapy: Secondary | ICD-10-CM | POA: Diagnosis not present

## 2021-10-27 ENCOUNTER — Ambulatory Visit
Admission: RE | Admit: 2021-10-27 | Discharge: 2021-10-27 | Disposition: A | Discharge status: Home / Self Care | Payer: BC Managed Care – PPO | Source: Ambulatory Visit | Attending: Radiation Oncology | Admitting: Radiation Oncology

## 2021-10-27 DIAGNOSIS — Z510 Encounter for antineoplastic radiation therapy: Secondary | ICD-10-CM | POA: Diagnosis not present

## 2021-10-28 ENCOUNTER — Other Ambulatory Visit: Payer: Self-pay | Admitting: "Endocrinology

## 2021-10-28 ENCOUNTER — Ambulatory Visit
Admission: RE | Admit: 2021-10-28 | Discharge: 2021-10-28 | Disposition: A | Discharge status: Home / Self Care | Payer: BC Managed Care – PPO | Source: Ambulatory Visit | Attending: Radiation Oncology | Admitting: Radiation Oncology

## 2021-10-28 ENCOUNTER — Other Ambulatory Visit: Payer: Self-pay

## 2021-10-28 DIAGNOSIS — Z510 Encounter for antineoplastic radiation therapy: Secondary | ICD-10-CM | POA: Diagnosis not present

## 2021-10-31 ENCOUNTER — Ambulatory Visit
Admission: RE | Admit: 2021-10-31 | Discharge: 2021-10-31 | Disposition: A | Discharge status: Home / Self Care | Payer: BC Managed Care – PPO | Source: Ambulatory Visit | Attending: Radiation Oncology | Admitting: Radiation Oncology

## 2021-10-31 ENCOUNTER — Ambulatory Visit
Admission: RE | Admit: 2021-10-31 | Payer: BC Managed Care – PPO | Source: Ambulatory Visit | Admitting: Radiation Oncology

## 2021-10-31 ENCOUNTER — Other Ambulatory Visit: Payer: Self-pay

## 2021-10-31 DIAGNOSIS — Z510 Encounter for antineoplastic radiation therapy: Secondary | ICD-10-CM | POA: Diagnosis not present

## 2021-11-01 ENCOUNTER — Ambulatory Visit
Admission: RE | Admit: 2021-11-01 | Discharge: 2021-11-01 | Disposition: A | Discharge status: Home / Self Care | Payer: BC Managed Care – PPO | Source: Ambulatory Visit | Attending: Radiation Oncology | Admitting: Radiation Oncology

## 2021-11-01 DIAGNOSIS — Z510 Encounter for antineoplastic radiation therapy: Secondary | ICD-10-CM | POA: Diagnosis not present

## 2021-11-02 ENCOUNTER — Ambulatory Visit: Payer: BC Managed Care – PPO | Admitting: Radiation Oncology

## 2021-11-02 ENCOUNTER — Other Ambulatory Visit: Payer: Self-pay

## 2021-11-02 ENCOUNTER — Ambulatory Visit
Admission: RE | Admit: 2021-11-02 | Discharge: 2021-11-02 | Disposition: A | Discharge status: Home / Self Care | Payer: BC Managed Care – PPO | Source: Ambulatory Visit | Attending: Radiation Oncology | Admitting: Radiation Oncology

## 2021-11-02 DIAGNOSIS — Z510 Encounter for antineoplastic radiation therapy: Secondary | ICD-10-CM | POA: Diagnosis not present

## 2021-11-03 ENCOUNTER — Ambulatory Visit
Admission: RE | Admit: 2021-11-03 | Discharge: 2021-11-03 | Disposition: A | Discharge status: Home / Self Care | Payer: BC Managed Care – PPO | Source: Ambulatory Visit | Attending: Radiation Oncology | Admitting: Radiation Oncology

## 2021-11-03 DIAGNOSIS — Z510 Encounter for antineoplastic radiation therapy: Secondary | ICD-10-CM | POA: Diagnosis not present

## 2021-11-04 ENCOUNTER — Ambulatory Visit
Admission: RE | Admit: 2021-11-04 | Discharge: 2021-11-04 | Disposition: A | Discharge status: Home / Self Care | Payer: BC Managed Care – PPO | Source: Ambulatory Visit | Attending: Radiation Oncology | Admitting: Radiation Oncology

## 2021-11-04 ENCOUNTER — Other Ambulatory Visit: Payer: Self-pay

## 2021-11-04 DIAGNOSIS — Z510 Encounter for antineoplastic radiation therapy: Secondary | ICD-10-CM | POA: Diagnosis not present

## 2021-11-07 ENCOUNTER — Other Ambulatory Visit: Payer: Self-pay

## 2021-11-07 ENCOUNTER — Ambulatory Visit
Admission: RE | Admit: 2021-11-07 | Discharge: 2021-11-07 | Disposition: A | Discharge status: Home / Self Care | Payer: BC Managed Care – PPO | Source: Ambulatory Visit | Attending: Radiation Oncology | Admitting: Radiation Oncology

## 2021-11-07 DIAGNOSIS — Z510 Encounter for antineoplastic radiation therapy: Secondary | ICD-10-CM | POA: Diagnosis not present

## 2021-11-07 DIAGNOSIS — C50411 Malignant neoplasm of upper-outer quadrant of right female breast: Secondary | ICD-10-CM

## 2021-11-07 DIAGNOSIS — Z170 Estrogen receptor positive status [ER+]: Secondary | ICD-10-CM

## 2021-11-07 MED ORDER — RADIAPLEXRX EX GEL: Freq: Once | CUTANEOUS | Status: AC

## 2021-11-08 ENCOUNTER — Encounter: Payer: Self-pay | Admitting: *Deleted

## 2021-11-08 ENCOUNTER — Encounter: Payer: Self-pay | Admitting: Radiation Oncology

## 2021-11-08 ENCOUNTER — Ambulatory Visit
Admission: RE | Admit: 2021-11-08 | Discharge: 2021-11-08 | Disposition: A | Discharge status: Home / Self Care | Payer: BC Managed Care – PPO | Source: Ambulatory Visit | Attending: Radiation Oncology | Admitting: Radiation Oncology

## 2021-11-08 DIAGNOSIS — Z510 Encounter for antineoplastic radiation therapy: Secondary | ICD-10-CM | POA: Diagnosis not present

## 2021-11-09 ENCOUNTER — Encounter: Payer: Self-pay | Admitting: Oncology

## 2021-11-14 ENCOUNTER — Encounter: Payer: Self-pay | Admitting: Oncology

## 2021-11-14 NOTE — Progress Notes (Signed)
° °                                                                                                                                                          °  Patient Name: Sophia Moore MRN: [id number] DOB: 1981/03/18 Referring Physician: Sharmaine Base CORNERSTONE (Profile Not Attached) Date of Service: 11/08/2021 [facility], Amanda                                                        End Of Treatment Note  Diagnoses: C50.411-Malignant neoplasm of upper-outer quadrant of right female breast  Cancer Staging:  Cancer Staging  Malignant neoplasm of upper-outer quadrant of right breast in female, estrogen receptor positive ([place]) Staging form: Breast, AJCC 8th Edition - Clinical stage from 05/04/2021: Stage IIA (cT3, cN0, cM0, G2, ER+, PR+, HER2-) - Signed by Chauncey Cruel, MD on 05/04/2021 Stage prefix: Initial diagnosis Histologic grading system: 3 grade system Laterality: Right Staged by: Pathologist and managing physician Stage used in treatment planning: Yes National guidelines used in treatment planning: Yes Type of national guideline used in treatment planning: NCCN  mpT3, pN1a   Intent: Curative  Radiation Treatment Dates: 09/26/2021 through 11/08/2021 Site Technique Total Dose (Gy) Dose per Fx (Gy) Completed Fx Beam Energies  Chest Wall, Right: CW_R_IMN 3D 50/50 2 25/25 6X  Chest Wall, Right: CW_R_PAB_SCV 3D 50/50 2 25/25 6X, 10X  Chest Wall, Right: CW_R_Bst Electron 10/10 2 5/5 6E   Narrative: The patient tolerated radiation therapy relatively well.   Plan: The patient will follow-up with radiation oncology in 1 mo. -----------------------------------  Eppie Gibson, MD

## 2021-11-18 ENCOUNTER — Encounter: Payer: Self-pay | Admitting: *Deleted

## 2021-11-18 DIAGNOSIS — Z170 Estrogen receptor positive status [ER+]: Secondary | ICD-10-CM

## 2021-11-18 DIAGNOSIS — C50411 Malignant neoplasm of upper-outer quadrant of right female breast: Secondary | ICD-10-CM

## 2021-11-21 ENCOUNTER — Ambulatory Visit: Payer: BC Managed Care – PPO | Attending: General Surgery

## 2021-11-21 ENCOUNTER — Other Ambulatory Visit: Payer: Self-pay

## 2021-11-21 VITALS — Wt 119.4 lb

## 2021-11-21 DIAGNOSIS — Z483 Aftercare following surgery for neoplasm: Secondary | ICD-10-CM | POA: Insufficient documentation

## 2021-11-21 NOTE — Therapy (Signed)
[facility] @ [address] [place], [place], [address] Phone: [phone]   Fax:  [phone]  Physical Therapy Treatment  Patient Details  Name: Domnique Vanegas MRN: [id number] Date of Birth: Apr 02, 1981 Referring Provider (PT): Dr. Wyvonnia Lora   Encounter Date: 11/21/2021   PT End of Session - 11/21/21 0807     Visit Number 8   # unchanged due to screen only   PT Start Time 0805    PT Stop Time 0814    PT Time Calculation (min) 9 min    Activity Tolerance Patient tolerated treatment well    Behavior During Therapy [facility] for tasks assessed/performed             Past Medical History:  Diagnosis Date   Depression    takes Citalopram daily   Family history of breast cancer    Thyroid nodule 07/2016    Past Surgical History:  Procedure Laterality Date   IR IMAGING GUIDED PORT INSERTION  06/27/2021   MASTECTOMY W/ SENTINEL NODE BIOPSY Right 06/01/2021   Procedure: RIGHT MASTECTOMY WITH AXILLARY SENTINEL LYMPH NODE BIOPSY;  Surgeon: Rolm Bookbinder, MD;  Location: [place];  Service: General;  Laterality: Right;   THYROIDECTOMY  09/13/2016   THYROIDECTOMY N/A 09/13/2016   Procedure: TOTAL THYROIDECTOMY;  Surgeon: Leta Baptist, MD;  [facility];  Service: ENT;  Laterality: N/A;   wisdom teeth extracted      WISDOM TOOTH EXTRACTION      Vitals:   11/21/21 0808  Weight: 119 lb 6 oz (54.1 kg)     Subjective Assessment - 11/21/21 0806     Subjective Pt returns for her 3 month L-Dex screen.    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 She underwent a right mastectomy and sentinel node biopsy (2 of 2 positive nodes) on 06/01/2021.  It is ER/PR positive and HER2 negative with a Ki67 of 5%. She had a thyroidectomy in 2017.                    L-DEX FLOWSHEETS - 11/21/21 0800       L-DEX LYMPHEDEMA  SCREENING   Measurement Type Unilateral    L-DEX MEASUREMENT EXTREMITY Upper Extremity    POSITION  Standing    DOMINANT SIDE Right    At Risk Side Right    BASELINE SCORE (UNILATERAL) 4.5    L-DEX SCORE (UNILATERAL) -0.8    VALUE CHANGE (UNILAT) -5.3                                     PT Long Term Goals - 08/02/21 0947       PT LONG TERM GOAL #1   Title Patient will demonstrate she has regained full shoulder ROM and function post operatively compared to bsaelines.    Status Achieved      PT LONG TERM GOAL #2   Title Patient will increase right shoulder active flexion to >/= 140 degrees for increased ease reaching overhead.    Status Achieved      PT LONG TERM GOAL #3   Title Patient will increase right shoulder active abduction to >/= 150 degrees for increased ease obtaining radiation positioning.    Status Achieved      PT LONG TERM GOAL #4   Title Patient will verbalize good  understanding of lymphedema risk reduction practices.    Status Achieved                   Plan - 11/21/21 0809     Clinical Impression Statement Pt returns for her 3 month L-Dex screen. Her change from baseline of -5.3 is WNLs so no furhter treatment is required at this time except to cont every 3 month L-Dex screens which pt is agreeable to.    PT Next Visit Plan Cont every 3 month L-Dex screens for up to 2 years from her SLNB (~05/2023)    Consulted and Agree with Plan of Care Patient             Patient will benefit from skilled therapeutic intervention in order to improve the following deficits and impairments:     Visit Diagnosis: Aftercare following surgery for neoplasm     Problem List Patient Active Problem List   Diagnosis Date Noted   S/P mastectomy, right 06/01/2021   Genetic testing 05/11/2021   Family history of breast cancer 05/04/2021   Malignant neoplasm of upper-outer quadrant of right breast in female, estrogen receptor positive  ([place]) 05/02/2021   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

## 2021-11-22 ENCOUNTER — Encounter: Payer: Self-pay | Admitting: *Deleted

## 2021-11-22 ENCOUNTER — Encounter: Payer: Self-pay | Admitting: Hematology and Oncology

## 2021-11-22 ENCOUNTER — Other Ambulatory Visit: Payer: Self-pay

## 2021-11-22 ENCOUNTER — Inpatient Hospital Stay: Payer: BC Managed Care – PPO | Attending: Oncology | Admitting: Hematology and Oncology

## 2021-11-22 VITALS — BP 112/78 | HR 60 | Temp 98.1°F | Resp 16 | Ht 61.0 in | Wt 120.3 lb

## 2021-11-22 DIAGNOSIS — Z803 Family history of malignant neoplasm of breast: Secondary | ICD-10-CM | POA: Insufficient documentation

## 2021-11-22 DIAGNOSIS — Z880 Allergy status to penicillin: Secondary | ICD-10-CM | POA: Insufficient documentation

## 2021-11-22 DIAGNOSIS — Z8349 Family history of other endocrine, nutritional and metabolic diseases: Secondary | ICD-10-CM | POA: Insufficient documentation

## 2021-11-22 DIAGNOSIS — Z923 Personal history of irradiation: Secondary | ICD-10-CM | POA: Insufficient documentation

## 2021-11-22 DIAGNOSIS — Z79899 Other long term (current) drug therapy: Secondary | ICD-10-CM | POA: Insufficient documentation

## 2021-11-22 DIAGNOSIS — Z823 Family history of stroke: Secondary | ICD-10-CM | POA: Insufficient documentation

## 2021-11-22 DIAGNOSIS — Z170 Estrogen receptor positive status [ER+]: Secondary | ICD-10-CM | POA: Diagnosis not present

## 2021-11-22 DIAGNOSIS — C50411 Malignant neoplasm of upper-outer quadrant of right female breast: Secondary | ICD-10-CM | POA: Insufficient documentation

## 2021-11-22 MED ORDER — TAMOXIFEN CITRATE 20 MG PO TABS: 20.0000 mg | ORAL_TABLET | Freq: Every day | ORAL | 3 refills | Status: DC

## 2021-11-22 NOTE — Progress Notes (Signed)
[place]  Telephone:([phone]) [phone] Fax:(336) [phone]    ID: Sophia Moore DOB: Nov 06, 1980  MR#: [id number]  JAS#:[id number]  Patient Care Team: [facility] as PCP - General (Family Medicine) Mauro Kaufmann, RN as Oncology Nurse Navigator Rockwell Germany, RN as Oncology Nurse Navigator Rolm Bookbinder, MD as Consulting Physician (General Surgery) Eppie Gibson, MD as Attending Physician (Radiation Oncology) Benay Pike, MD as Consulting Physician (Hematology and Oncology) Benay Pike, MD  OTHER MD:  CHIEF COMPLAINT: Estrogen receptor positive breast cancer  CURRENT TREATMENT: adjvuant chemotherapy   INTERVAL HISTORY: Sophia Moore returns today for follow up and treatment of her estrogen receptor positive breast cancer.  She completed adjuvant radiation 1/31. She says her energy is better, she is exercising again and running. She is very worried about taking tamoxifen again. She says the last time she took it, she had headaches and she couldn't sleep. She is also worried about other possible adverse effects from Tamoxifen. Rest of the pertinent 10 point ROS reviewed and negative.  REVIEW OF SYSTEMS:   COVID 19 VACCINATION STATUS: not vaccinated, infection 05/2019 (results in [place])   HISTORY OF CURRENT ILLNESS: From the original intake note:  Sophia Moore had routine screening mammography (her first ever) on 03/22/2021 showing a possible abnormality in the right breast. She underwent right diagnostic mammography with tomography and right breast ultrasonography at [facility] on 04/19/2021 showing: breast density category C; palpable 6.3 cm mass involving the central and upper half of the right breast; 2-4 satellite nodules within upper-outer quadrant; no right axillary adenopathy.  Accordingly on 04/26/2021 she proceeded to biopsy of the right breast area in question. 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 Proliferation marker Ki67 at 5%. HER2 equivocal by immunohistochemistry (2+), but negative by fluorescent in situ hybridization with a signals ratio 1.2 and number per cell 1.8. 2. Right Breast, upper-outer axillary tail  - flat epithelial atypia with calcifications   Cancer Staging  Malignant neoplasm of upper-outer quadrant of right breast in female, estrogen receptor positive ([place]) Staging form: Breast, AJCC 8th Edition - Clinical stage from 05/04/2021: Stage IIA (cT3, cN0, cM0, G2, ER+, PR+, HER2-) - Signed by Chauncey Cruel, MD on 05/04/2021 Stage prefix: Initial diagnosis Histologic grading system: 3 grade system Laterality: Right Staged by: Pathologist and managing physician Stage used in treatment planning: Yes National guidelines used in treatment planning: Yes Type of national guideline used in treatment planning: NCCN   The patient's subsequent history is as detailed below.  PAST MEDICAL HISTORY: Past Medical History:  Diagnosis Date   Depression    takes Citalopram daily   Family history of breast cancer    Thyroid nodule 07/2016    PAST SURGICAL HISTORY: Past Surgical History:  Procedure Laterality Date   IR IMAGING GUIDED PORT INSERTION  06/27/2021   MASTECTOMY W/ SENTINEL NODE BIOPSY Right 06/01/2021   Procedure: RIGHT MASTECTOMY WITH AXILLARY SENTINEL LYMPH NODE BIOPSY;  Surgeon: Rolm Bookbinder, MD;  Location: [facility];  Service: General;  Laterality: Right;   THYROIDECTOMY  09/13/2016   THYROIDECTOMY N/A 09/13/2016   Procedure: TOTAL THYROIDECTOMY;  Surgeon: Leta Baptist, MD;  Location: [facility] [place] [facility];  Service: ENT;  Laterality: N/A;   wisdom  teeth extracted  WISDOM TOOTH EXTRACTION      FAMILY HISTORY: Family History  Problem Relation Age of Onset   Hyperlipidemia Mother    Stroke Mother    Breast cancer Maternal Aunt        dx 50s   Her parents are both living, her father age 54 and her mother age 71, as of 04/2021. Clariece has two brothers and three sisters. She reports breast cancer in a maternal aunt (age 109+).   GYNECOLOGIC HISTORY:  No LMP recorded. Menarche: unsure Age at first live birth: 41 years old [place] P 3 LMP 04/13/2021 Contraceptive never used HRT n/a  Hysterectomy? no BSO? 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  She is originally from the [place] section of [place].  Husband Efren work in Architect. She lives at home with daughter [name], age 29, and son [name], age 57. Her oldest son [name], age 80, works at [employer]. She attends a [employer].    ADVANCED DIRECTIVES: In the absence of any documentation to the contrary, the patient's spouse is their HCPOA.    HEALTH MAINTENANCE: Social History   Tobacco Use   Smoking status: Never   Smokeless tobacco: Never  Vaping Use   Vaping Use: Never used  Substance Use Topics   Alcohol use: No   Drug use: No     Colonoscopy: n/a (age)  PAP: 03/2021  Bone density: n/a (age)   Allergies  Allergen Reactions   Amoxicillin Rash   Penicillins Rash    Has patient had a PCN reaction causing immediate rash, facial/tongue/throat swelling, SOB or lightheadedness with hypotension: Yes Has patient had a PCN reaction causing severe rash involving mucus membranes or skin necrosis: No Has patient had a PCN reaction that required hospitalization No Has patient had a PCN reaction occurring within the last 10 years: Yes If all of the above answers are "NO", then may proceed with Cephalosporin use.     Current Outpatient Medications   Medication Sig Dispense Refill   cholecalciferol (VITAMIN D3) 25 MCG (1000 UNIT) tablet Take 1,000 Units by mouth daily.     citalopram (CELEXA) 20 MG tablet Take 20 mg by mouth daily.     levothyroxine (SYNTHROID) 100 MCG tablet Take 1 tablet (100 mcg total) by mouth daily. 90 tablet 3   olopatadine (PATADAY) 0.1 % ophthalmic solution Place 1 drop into both eyes 2 (two) times daily. 5 mL 0   tamoxifen (NOLVADEX) 20 MG tablet Take 20 mg by mouth daily.     No current facility-administered medications for this visit.    OBJECTIVE: Spanish speaker in no acute distress  There were no vitals filed for this visit.    There is no height or weight on file to calculate BMI.   Wt Readings from Last 3 Encounters:  11/21/21 119 lb 6 oz (54.1 kg)  10/04/21 120 lb 12.8 oz (54.8 kg)  09/15/21 122 lb (55.3 kg)  ECOG FS:1 - Symptomatic but completely ambulatory  Sclerae unicteric, EOMs intact Wearing a mask No cervical or supraclavicular adenopathy Lungs no rales or rhonchi Heart regular rate and rhythm Abd soft, nontender, positive bowel sounds MSK no focal spinal tenderness, no upper extremity lymphedema Neuro: nonfocal, well oriented, appropriate affect Breasts: right mastectomy, post rad changes noted Left breast, no palpable masses or regional adenopathy.  LAB RESULTS:  CMP  Component Value Date/Time   NA 143 10/04/2021 1124   NA 143 09/09/2021 0834   K 3.8 10/04/2021 1124   CL 107 10/04/2021 1124   CO2 30 10/04/2021 1124   GLUCOSE 70 10/04/2021 1124   BUN 13 10/04/2021 1124   BUN 12 09/09/2021 0834   CREATININE 0.57 10/04/2021 1124   CREATININE 0.62 08/16/2021 0939   CALCIUM 9.7 10/04/2021 1124   PROT 6.6 10/04/2021 1124   PROT 5.8 (L) 09/09/2021 0834   ALBUMIN 4.1 10/04/2021 1124   ALBUMIN 3.8 09/09/2021 0834   AST 28 10/04/2021 1124   AST 16 08/16/2021 0939   ALT 38 10/04/2021 1124   ALT 32 08/16/2021 0939   ALKPHOS 71 10/04/2021 1124   BILITOT 0.7 10/04/2021 1124    BILITOT 0.3 09/09/2021 0834   BILITOT 0.4 08/16/2021 0939   GFRNONAA >60 10/04/2021 1124   GFRNONAA >60 08/16/2021 0939    No results found for: TOTALPROTELP, ALBUMINELP, A1GS, A2GS, BETS, BETA2SER, GAMS, MSPIKE, SPEI  Lab Results  Component Value Date   WBC 5.8 10/04/2021   NEUTROABS 4.4 10/04/2021   HGB 13.4 10/04/2021   HCT 40.2 10/04/2021   MCV 89.9 10/04/2021   PLT 289 10/04/2021    No results found for: LABCA2  No components found for: CHYIFO277  No results for input(s): INR in the last 168 hours.  No results found for: LABCA2  No results found for: AJO878  No results found for: MVE720  No results found for: NOB096  No results found for: CA2729  No components found for: HGQUANT  No results found for: CEA1 / No results found for: CEA1   No results found for: AFPTUMOR  No results found for: CHROMOGRNA  No results found for: KPAFRELGTCHN, LAMBDASER, KAPLAMBRATIO (kappa/lambda light chains)  No results found for: HGBA, HGBA2QUANT, HGBFQUANT, HGBSQUAN (Hemoglobinopathy evaluation)   No results found for: LDH  No results found for: IRON, TIBC, IRONPCTSAT (Iron and TIBC)  No results found for: FERRITIN  Urinalysis    Component Value Date/Time   COLORURINE YELLOW 09/12/2021 1238   APPEARANCEUR HAZY (A) 09/12/2021 1238   LABSPEC 1.006 09/12/2021 1238   PHURINE 6.0 09/12/2021 [place] 09/12/2021 1238   HGBUR SMALL (A) 09/12/2021 [place] 09/12/2021 [place] 09/12/2021 [place] 09/12/2021 1238   NITRITE NEGATIVE 09/12/2021 1238   LEUKOCYTESUR SMALL (A) 09/12/2021 1238    STUDIES: No results found.   ELIGIBLE FOR AVAILABLE RESEARCH PROTOCOL: no  ASSESSMENT: 41 y.o. Spanish speaker status post right breast upper outer quadrant biopsy 04/26/2021 for a clinical mT3 N0, stage IIA invasive ductal carcinoma, grade 1 or 2, estrogen and progesterone receptor positive, HER2 not  amplified, with an MIB-1 of 5%.  (1) genetics testing 05/16/2021 through the [employer] panel (report date 05/11/2021) or CancerNext-Expanded + RNAinsight panel (report date 05/16/2021).   The BRCAplus panel offered by [employer] and includes sequencing and deletion/duplication analysis for the following 8 genes: ATM, BRCA1, BRCA2, CDH1, CHEK2, PALB2, PTEN, and TP53. The CancerNext-Expanded + RNAinsight gene panel offered by [employer] and includes sequencing and rearrangement analysis for the following 77 genes: AIP, ALK, APC, ATM, AXIN2, BAP1, BARD1, BLM, BMPR1A, BRCA1, BRCA2, BRIP1, CDC73, CDH1, CDK4, CDKN1B, CDKN2A, CHEK2, CTNNA1, DICER1, FANCC, FH, FLCN, GALNT12, KIF1B, LZTR1, MAX, MEN1, MET, MLH1, MSH2, MSH3, MSH6, MUTYH, NBN, NF1, NF2, NTHL1, PALB2, PHOX2B, PMS2, POT1, PRKAR1A, PTCH1, PTEN, RAD51C, RAD51D, RB1, RECQL, RET, SDHA, SDHAF2, SDHB, SDHC,  SDHD, SMAD4, SMARCA4, SMARCB1, SMARCE1, STK11, SUFU, TMEM127, TP53, TSC1, TSC2, VHL and XRCC2 (sequencing and deletion/duplication); EGFR, EGLN1, HOXB13, KIT, MITF, PDGFRA, POLD1 and POLE (sequencing only); EPCAM and GREM1 (deletion/duplication only). RNA data is routinely analyzed for use in variant interpretation for all genes.  (A) A variant of uncertain significance (VUS) was detected in the [facility] gene called p.R1369H (c.4106G>A).  (2) MammaPrint obtained from the 04/26/2021 biopsy returned luminal a type, low risk, with a predicted benefit from additional chemotherapy in the 1-2% range [given the patient's young age however, the benefit of chemotherapy might be as high as 5%].  (3) status post right mastectomy and sentinel lymph node sampling 06/01/2021 for an mpT3 pN1-2, stage IIA invasive ductal carcinoma, grade 1, with negative margins  (A) a total of 2 right axillary lymph nodes were removed, both positive, 1 with extracapsular extension  (B) completion axillary dissection being considered  (3) adjuvant chemotherapy with docetaxel,  cyclophosphamide given on day 1 of a 21 day cycle x 4 from 06/30/2021 through 09/01/2021  (4) adjuvant radiation completed 11/08/2021  (5) tamoxifen started 05/04/2021 in anticipation of surgical delays; held during chemotherapy.  #6 she completed adjuvant chemotherapy on 08/31/2021   PLAN: She is here to discuss adjuvant antiestrogen therapy We have once again discussed the mechanism of action of estrogen, adverse effects of tamoxifen including post menopausal symptoms,  increased risk of DVT/PE,  increased risk of endometrial cancer, cataracts, increased risk of cardiovascular effects. We have discussed about role of abemaciclib based on [facility] E trial but she doesn't want to take this. She understands the benefits, but she is not willing  to take two medications at any cost. She is only willing to take one anti estrogen therapy, hence she will continue tamoxifen Prescription sent to pharmacy of her choice. Port removal ordered Left breast diagnostic mammogram ordered for July 2022.  I spent 30 minutes in the care of this patient, reviewing her history, physical examination, counseling and coordination of care including discussion about antiestrogen therapy.  *Total Encounter Time as defined by the Centers for Medicare and Medicaid Services includes, in addition to the face-to-face time of a patient visit (documented in the note above) non-face-to-face time: obtaining and reviewing outside history, ordering and reviewing medications, tests or procedures, care coordination (communications with other health care professionals or caregivers) and documentation in the medical record.  Benay Pike MD

## 2021-11-28 ENCOUNTER — Other Ambulatory Visit (HOSPITAL_COMMUNITY): Payer: Self-pay | Admitting: Physician Assistant

## 2021-11-29 ENCOUNTER — Ambulatory Visit (HOSPITAL_COMMUNITY)
Admission: RE | Admit: 2021-11-29 | Discharge: 2021-11-29 | Disposition: A | Discharge status: Home / Self Care | Payer: BC Managed Care – PPO | Source: Ambulatory Visit | Attending: Hematology and Oncology | Admitting: Hematology and Oncology

## 2021-11-29 ENCOUNTER — Other Ambulatory Visit: Payer: Self-pay

## 2021-11-29 DIAGNOSIS — Z452 Encounter for adjustment and management of vascular access device: Secondary | ICD-10-CM | POA: Diagnosis not present

## 2021-11-29 DIAGNOSIS — C50411 Malignant neoplasm of upper-outer quadrant of right female breast: Secondary | ICD-10-CM | POA: Insufficient documentation

## 2021-11-29 DIAGNOSIS — Z170 Estrogen receptor positive status [ER+]: Secondary | ICD-10-CM | POA: Diagnosis not present

## 2021-11-29 HISTORY — PX: IR REMOVAL TUN ACCESS W/ PORT W/O FL MOD SED: IMG2290

## 2021-11-29 MED ORDER — LIDOCAINE-EPINEPHRINE 1 %-1:100000 IJ SOLN
INTRAMUSCULAR | Status: AC
  Filled 2021-11-29: qty 1

## 2021-11-29 MED ORDER — LIDOCAINE-EPINEPHRINE 1 %-1:100000 IJ SOLN
INTRAMUSCULAR | Status: AC
  Filled 2021-11-29: qty 2

## 2021-11-29 NOTE — Progress Notes (Signed)
Patient presented to the [facility] Interventional Radiology department 11/29/21 for port-a-catheter removal.   Please call Interventional Radiology with any questions.  Soyla Dryer, [place] [phone] 11/29/2021, 10:21 AM

## 2021-11-29 NOTE — Procedures (Signed)
Vascular and Interventional Radiology Procedure Note  Patient: Sophia Moore DOB: October 31, 1980 Medical Record Number: [id number] Note Date/Time: 11/29/21 10:08 AM   Performing Physician: Michaelle Birks, MD Assistant(s): None  Diagnosis: Breast cancer and Completion of Therapy.  Procedure: PORT REMOVAL  Anesthesia: Local Anesthetic Complications: None Estimated Blood Loss: Minimal Specimens:  None  Findings:  Successful removal of a left-sided venous port. Primary incision closure. Dermabond at skin.  See detailed procedure note with images in PACS. The patient tolerated the procedure well without incident or complication and was returned to Recovery in stable condition.    Michaelle Birks, MD Vascular and Interventional Radiology Specialists [facility] Radiology   Pager. [address]

## 2021-12-01 LAB — TSH: TSH: 0.006 u[IU]/mL — ABNORMAL LOW (ref 0.450–4.500)

## 2021-12-01 LAB — T4, FREE: Free T4: 1.71 ng/dL (ref 0.82–1.77)

## 2021-12-07 ENCOUNTER — Other Ambulatory Visit: Payer: Self-pay

## 2021-12-07 ENCOUNTER — Ambulatory Visit
Admission: RE | Admit: 2021-12-07 | Discharge: 2021-12-07 | Disposition: A | Discharge status: Home / Self Care | Payer: BC Managed Care – PPO | Source: Ambulatory Visit | Attending: Radiation Oncology | Admitting: Radiation Oncology

## 2021-12-07 ENCOUNTER — Encounter: Payer: Self-pay | Admitting: Radiation Oncology

## 2021-12-07 VITALS — BP 100/70 | HR 79 | Resp 17

## 2021-12-07 DIAGNOSIS — Z923 Personal history of irradiation: Secondary | ICD-10-CM | POA: Diagnosis not present

## 2021-12-07 DIAGNOSIS — Z170 Estrogen receptor positive status [ER+]: Secondary | ICD-10-CM | POA: Diagnosis not present

## 2021-12-07 DIAGNOSIS — Z79899 Other long term (current) drug therapy: Secondary | ICD-10-CM | POA: Diagnosis not present

## 2021-12-07 DIAGNOSIS — C50411 Malignant neoplasm of upper-outer quadrant of right female breast: Secondary | ICD-10-CM | POA: Insufficient documentation

## 2021-12-07 NOTE — Progress Notes (Signed)
?Radiation Oncology         [phone] ?________________________________ ? ?Name: Sophia Moore MRN: [id number]  ?Date: 12/07/2021  DOB: Aug 02, 1981 ? ?Follow-Up Visit Note ? ?Outpatient ? ?CC: Sophia Moore, Stage manager At  [place] ? ?Diagnosis and Prior Radiotherapy:  ?  ICD-10-CM   ?1. Malignant neoplasm of upper-outer quadrant of right breast in female, estrogen receptor positive ([place])  C50.411   ? Z17.0   ?  ? ?mpT3, pN1a  ? ?Intent: Curative ? ?Radiation Treatment Dates: 09/26/2021 through 11/08/2021 ?Site Technique Total Dose (Gy) Dose per Fx (Gy) Completed Fx Beam Energies  ?Chest Wall, Right: CW_R_IMN 3D 50/50 2 25/25 6X  ?Chest Wall, Right: CW_R_PAB_SCV 3D 50/50 2 25/25 6X, 10X  ?Chest Wall, Right: CW_R_Bst Electron 10/10 2 5/5 6E  ? ?CHIEF COMPLAINT: Here for follow-up and surveillance of breast cancer ? ?Narrative:  The patient returns today for routine follow-up.  Sophia Moore presents today for follow-up after completing radiation to her right breast on 11/08/2021 ? ?Pain: Reports occasional twinges (states they are short lived and resolve on their own) ?Skin: Reports skin is intact and well healed. Recommended applying lotion with vitamin E oil to treatment field for an additional 2 months ?ROM: Patient denies any issues; reports she is diligent about doing her PT stretching and exercises ?Lymphedema: Patient denies ?MedOnc F/U: Scheduled for F/U with Dr. Arletha Pili Moore on 02/20/2022 ?Other issues of note: Overall reports she feels good and is pleased with her continued recovery/progress ? ?Pt reports Yes No Comments  ?Tamoxifen [x]  []  Mild hot flashes   ?Letrozole []  [x]    ?Anastrazole []  [x]    ?Mammogram [x]  Date: TBD []    ?                            ? ?ALLERGIES:  is allergic to amoxicillin and penicillins. ? ?Meds: ?Current Outpatient Medications  ?Medication Sig Dispense Refill  ? cholecalciferol (VITAMIN D3) 25 MCG (1000 UNIT) tablet  Take 1,000 Units by mouth daily.    ? citalopram (CELEXA) 20 MG tablet Take 20 mg by mouth daily.    ? levothyroxine (SYNTHROID) 100 MCG tablet Take 1 tablet (100 mcg total) by mouth daily. 90 tablet 3  ? olopatadine (PATADAY) 0.1 % ophthalmic solution Place 1 drop into both eyes 2 (two) times daily. 5 mL 0  ? tamoxifen (NOLVADEX) 20 MG tablet Take 1 tablet (20 mg total) by mouth daily. 30 tablet 3  ? ?No current facility-administered medications for this encounter.  ? ? ?Physical Findings: ?The patient is in no acute distress. Patient is alert and oriented. ? blood pressure is 100/70 and her pulse is 79. Her respiration is 17 and oxygen saturation is 100%. Marland Kitchen    ?Satisfactory skin healing in radiotherapy fields. Skin over right chest wall/ upper torso is intact and still hyperpigmented but much improved ? ? ?Lab Findings: ?Lab Results  ?Component Value Date  ? WBC 5.8 10/04/2021  ? HGB 13.4 10/04/2021  ? HCT 40.2 10/04/2021  ? MCV 89.9 10/04/2021  ? PLT 289 10/04/2021  ? ? ?Radiographic Findings: ?IR REMOVAL TUN ACCESS W/ PORT W/O FL MOD SED ? ?Result Date: 11/29/2021 ?CLINICAL DATA:  History of breast cancer.  Completion of therapy EXAM: REMOVAL OF IMPLANTED TUNNELED PORT-A-CATH MEDICATIONS: None. ANESTHESIA/SEDATION: Local anesthetic was employed during this procedure. The patient's vital signs were monitored continuously by radiology nursing throughout the procedure under my direct supervision. FLUOROSCOPY TIME:  None PROCEDURE: Informed written consent was obtained from the patient after a discussion of the risk, benefits and alternatives to the procedure. The patient was positioned supine on the fluoroscopy table and the LEFT chest Port-A-Cath site was prepped with chlorhexidine. A sterile gown and gloves were worn during the procedure. Local anesthesia was provided with 1% lidocaine with epinephrine. A timeout was performed prior to the initiation of the procedure. An incision was made overlying the Port-A-Cath  with a #15 scalpel. Utilizing sharp and blunt dissection, the Port-A-Cath was removed completely. The pocked was irrigated with sterile saline. Wound closure was performed with interrupted subcutaneous 2-0 Vicryl sutures, then Dermabond was applied at the skin. Dressings were applied. The patient tolerated the procedure well without immediate post procedural complication. FINDINGS: Removal of implant Port-A-Cath without immediate post procedural complication. IMPRESSION: Successful removal of LEFT chest implanted Port-A-Cath, as above. Michaelle Birks, MD Vascular and Interventional Radiology Specialists [facility] Radiology Electronically Signed   By: Michaelle Birks M.D.   On: 11/29/2021 20:02   ? ?Impression/Plan: Healing well from radiotherapy  ? ?Continue skin care with topical Vitamin E Oil for at least 2 more months for further healing. ? ?I encouraged her to continue with yearly mammography as appropriate (for intact breast tissue) and followup with medical oncology. I will see her back on an as-needed basis. I have encouraged her to call if she has any issues or concerns in the future. I wished her the very best. ? ?On date of service, in total, I spent 15 minutes on this encounter. Patient was seen in person.  ?_____________________________________ ? ? ?Eppie Gibson, MD  ?

## 2021-12-07 NOTE — Progress Notes (Signed)
Sophia Moore presents today for follow-up after completing radiation to her right breast on 11/08/2021 ? ?Pain: Reports occasional twinges (states they are short lived and resolve on their own) ?Skin: Reports skin is intact and well healed. Recommended applying lotion with vitamin E oil to treatment field for an additional 2 months ?ROM: Patient denies any issues; reports she is diligent about doing her PT stretching and exercises ?Lymphedema: Patient denies ?MedOnc F/U: Scheduled for F/U with Dr. Arletha Pili [place] on 02/20/2022 ?Other issues of note: Overall reports she feels good and is pleased with her continued recovery/progress ? ?Pt reports Yes No Comments  ?Tamoxifen [x]  []  Mild hot flashes   ?Letrozole []  [x]    ?Anastrazole []  [x]    ?Mammogram [x]  Date: TBD []    ? ? ?

## 2021-12-14 ENCOUNTER — Other Ambulatory Visit: Payer: Self-pay

## 2021-12-14 ENCOUNTER — Ambulatory Visit (INDEPENDENT_AMBULATORY_CARE_PROVIDER_SITE_OTHER): Payer: BC Managed Care – PPO | Admitting: "Endocrinology

## 2021-12-14 ENCOUNTER — Encounter: Payer: Self-pay | Admitting: "Endocrinology

## 2021-12-14 VITALS — BP 102/68 | HR 72 | Ht 61.0 in | Wt 117.8 lb

## 2021-12-14 DIAGNOSIS — E890 Postprocedural hypothyroidism: Secondary | ICD-10-CM

## 2021-12-14 MED ORDER — LEVOTHYROXINE SODIUM 88 MCG PO TABS: 88.0000 ug | ORAL_TABLET | Freq: Every day | ORAL | 2 refills | Status: DC

## 2021-12-14 NOTE — Progress Notes (Signed)
12/14/2021  Endocrinology follow-up note   Subjective:    Patient ID: Sophia Moore, female    DOB: [birth year], PCP [facility]   Past Medical History:  Diagnosis Date   Depression    takes Citalopram daily   Family history of breast cancer    Thyroid nodule 07/2016   Past Surgical History:  Procedure Laterality Date   IR IMAGING GUIDED PORT INSERTION  06/27/2021   IR REMOVAL TUN ACCESS W/ PORT W/O FL MOD SED  11/29/2021   MASTECTOMY W/ SENTINEL NODE BIOPSY Right 06/01/2021   Procedure: RIGHT MASTECTOMY WITH AXILLARY SENTINEL LYMPH NODE BIOPSY;  Surgeon: Rolm Bookbinder, MD;  Location: [place];  Service: General;  Laterality: Right;   THYROIDECTOMY  09/13/2016   THYROIDECTOMY N/A 09/13/2016   Procedure: TOTAL THYROIDECTOMY;  Surgeon: Leta Baptist, MD;  Location: [facility];  Service: ENT;  Laterality: N/A;   wisdom teeth extracted      WISDOM TOOTH EXTRACTION     Social History   Socioeconomic History   Marital status: Married    Spouse name: Not on file   Number of children: Not on file   Years of education: Not on file   Highest education level: Not on file  Occupational History   Not on file  Tobacco Use   Smoking status: Never   Smokeless tobacco: Never  Vaping Use   Vaping Use: Never used  Substance and Sexual Activity   Alcohol use: No   Drug use: No   Sexual activity: Not Currently    Birth control/protection: None  Other Topics Concern   Not on file  Social History Narrative   Not on file   Social Determinants of Health   Financial Resource Strain: High Risk   Difficulty of Paying Living Expenses: Very hard  Food Insecurity: Not on file  Transportation Needs: No Transportation Needs   Lack of Transportation (Medical): No   Lack of Transportation (Non-Medical): No  Physical Activity: Not on file  Stress: Not on file  Social Connections: Not on file   Outpatient Encounter Medications as of  12/14/2021  Medication Sig   cholecalciferol (VITAMIN D3) 25 MCG (1000 UNIT) tablet Take 1,000 Units by mouth daily.   citalopram (CELEXA) 20 MG tablet Take 20 mg by mouth daily.   levothyroxine (SYNTHROID) 88 MCG tablet Take 1 tablet (88 mcg total) by mouth daily.   olopatadine (PATADAY) 0.1 % ophthalmic solution Place 1 drop into both eyes 2 (two) times daily.   tamoxifen (NOLVADEX) 20 MG tablet Take 1 tablet (20 mg total) by mouth daily.   [DISCONTINUED] levothyroxine (SYNTHROID) 100 MCG tablet Take 1 tablet (100 mcg total) by mouth daily.   No facility-administered encounter medications on file as of 12/14/2021.   ALLERGIES: Allergies  Allergen Reactions   Amoxicillin Rash   Penicillins Rash    Has patient had a PCN reaction causing immediate rash, facial/tongue/throat swelling, SOB or lightheadedness with hypotension: Yes Has patient had a PCN reaction causing severe rash involving mucus membranes or skin necrosis: No Has patient had a PCN reaction that required hospitalization No Has patient had a PCN reaction occurring within the last 10 years: Yes If all of the above answers are "NO", then may proceed with Cephalosporin use.    VACCINATION STATUS: Immunization History  Administered Date(s) Administered   PPD Test 04/24/2016   Tdap 07/02/2020    HPI 41 year old female patient with medical history as above. 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 - Her surgical pathology was negative for malignancy.  She is currently on thyroid hormone replacement, levothyroxine 100 mcg p.o. daily before breakfast.    Her previsit thyroid function tests are consistent with slight over replacement.   -Prior to her last visit, she was diagnosed with breast cancer s/p surgery and chemotherapy.  She is awaiting for radiation therapy. She reports symptoms of fatigue, and few pounds of weight gain. She denies  palpitations, tremors, nor heat intolerance.  - She denies family history of thyroid dysfunction. She denies exposure to neck radiation. She denies heat/cold intolerance. She has steady body weight. Denies dysphagia, shortness of breath, nor voice change.  Review of Systems  Constitutional:  + Slightly fluctuating heart rate,  no fatigue, no subjective hyperthermia/hypothermia   Objective:    BP 102/68    Pulse 72    Ht '5\' 1"'$  (1.549 m)    Wt 117 lb 12.8 oz (53.4 kg)    BMI 22.26 kg/m   Wt Readings from Last 3 Encounters:  12/14/21 117 lb 12.8 oz (53.4 kg)  11/22/21 120 lb 4.8 oz (54.6 kg)  11/21/21 119 lb 6 oz (54.1 kg)      Physical Exam- Limited  Constitutional:  Body mass index is 22.26 kg/m. , not in acute distress, normal state of mind       09/13/2016 surgical pathology of total thyroidectomy-no malignancy. Thyroid, thyroidectomy, Right - FOLLICULAR ADENOMA WITH CALCIFICATIONS, 2.1 CM. - MICROSCOPIC INTRATHYROIDAL PARATHYROID TISSUE. - THERE IS NO EVIDENCE OF MALIGNANCY.  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  January 29, 2018 thyroid/neck ultrasound: Thyroid surgically absent with no evidence of remnant or recurrence.  Recent Results (from the past 2160 hour(s))  Comprehensive metabolic panel     Status: None   Collection Time: 10/04/21 11:24 AM  Result Value Ref Range   Sodium 143 135 - 145 mmol/L   Potassium 3.8 3.5 - 5.1 mmol/L   Chloride 107 98 - 111 mmol/L   CO2 30 22 - 32 mmol/L   Glucose, Bld 70 70 - 99 mg/dL    Comment: Glucose reference range applies only to samples taken after fasting for at least 8 hours.   BUN 13 6 - 20 mg/dL   Creatinine, Ser 0.57 0.44 - 1.00 mg/dL   Calcium 9.7 8.9 - 10.3 mg/dL   Total Protein 6.6 6.5 - 8.1 g/dL   Albumin 4.1 3.5 - 5.0 g/dL   AST 28 15 - 41 U/L   ALT 38 0 - 44 U/L    Alkaline Phosphatase 71 38 - 126 U/L   Total Bilirubin 0.7 0.3 - 1.2 mg/dL   GFR, Estimated >60 >60 mL/min    Comment: (NOTE) Calculated using the CKD-EPI Creatinine Equation (2021)    Anion gap 6 5 - 15    Comment: Performed at [facility], [place] [address] [place],  [address]  CBC with Differential/Platelet     Status: None   Collection Time: 10/04/21 11:24 AM  Result Value Ref Range   WBC 5.8 4.0 - 10.5 K/uL   RBC 4.47 3.87 - 5.11 MIL/uL   Hemoglobin 13.4 12.0 - 15.0 g/dL   HCT 40.2 36.0 - 46.0 %   MCV 89.9 80.0 - 100.0 fL   MCH 30.0 26.0 - 34.0 pg  MCHC 33.3 30.0 - 36.0 g/dL   RDW 14.5 11.5 - 15.5 %   Platelets 289 150 - 400 K/uL   nRBC 0.0 0.0 - 0.2 %   Neutrophils Relative % 75 %   Neutro Abs 4.4 1.7 - 7.7 K/uL   Lymphocytes Relative 15 %   Lymphs Abs 0.9 0.7 - 4.0 K/uL   Monocytes Relative 8 %   Monocytes Absolute 0.5 0.1 - 1.0 K/uL   Eosinophils Relative 1 %   Eosinophils Absolute 0.1 0.0 - 0.5 K/uL   Basophils Relative 1 %   Basophils Absolute 0.0 0.0 - 0.1 K/uL   Immature Granulocytes 0 %   Abs Immature Granulocytes 0.01 0.00 - 0.07 K/uL    Comment: Performed at [facility], [place] [address] [place], [place] [address]  Pregnancy, urine     Status: None   Collection Time: 10/04/21 11:25 AM  Result Value Ref Range   Preg Test, Ur NEGATIVE NEGATIVE    Comment:        THE SENSITIVITY OF THIS METHODOLOGY IS >20 mIU/mL. Performed at [facility], [place] [address] [place], [place] [address]   POCT rapid strep A     Status: None   Collection Time: 10/10/21  2:13 PM  Result Value Ref Range   Rapid Strep A Screen Negative Negative  Novel Coronavirus, NAA ([facility])     Status: None   Collection Time: 10/10/21  2:30 PM   Specimen: Nasopharyngeal Swab; Nasopharyngeal(NP) swabs in vial transport medium   Nasopharynge NASOPHARY  Result Value Ref Range   SARS-CoV-2, NAA Not Detected Not  Detected    Comment: This nucleic acid amplification test was developed and its performance characteristics determined by [employer]. Nucleic acid amplification tests include RT-PCR and TMA. This test has not been FDA cleared or approved. This test has been authorized by FDA under an Emergency Use Authorization (EUA). This test is only authorized for the duration of time the declaration that circumstances exist justifying the authorization of the emergency use of in vitro diagnostic tests for detection of SARS-CoV-2 virus and/or diagnosis of COVID-19 infection under section 564(b)(1) of the Act, 21 U.S.C. 259DGL-8(V) (1), unless the authorization is terminated or revoked sooner. When diagnostic testing is negative, the possibility of a false negative result should be considered in the context of a patient's recent exposures and the presence of clinical signs and symptoms consistent with COVID-19. An individual without symptoms of COVID-19 and who is not shedding SARS-CoV-2 virus wo uld expect to have a negative (not detected) result in this assay.   Culture, group A strep     Status: None   Collection Time: 10/10/21  2:30 PM   Specimen: Throat  Result Value Ref Range   Specimen Description THROAT    Special Requests NONE    Culture      NO GROUP A STREP (S.PYOGENES) ISOLATED Performed at [facility], [place] [address] [place], [place] [address]    Report Status 10/14/2021 FINAL   SARS-COV-2, NAA 2 DAY TAT     Status: None   Collection Time: 10/10/21  2:30 PM   Nasopharynge NASOPHARY  Result Value Ref Range   SARS-CoV-2, NAA 2 DAY TAT Performed   TSH     Status: Abnormal   Collection Time: 11/30/21  9:21 AM  Result Value Ref Range   TSH 0.006 (L) 0.450 - 4.500 uIU/mL  T4, free     Status: None  Collection Time: 11/30/21  9:21 AM  Result Value Ref Range   Free T4 1.71 0.82 - 1.77 ng/dL     Assessment & Plan:  1. 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    Her surgical sample diagnosis showed no malignancy.    - She did not require radioactive iodine ablative therapy.   -Regarding her postsurgical hypothyroidism: -Her thyroid function tests are consistent with over replacement.  I discussed and lowered her levothyroxine to 88 mcg p.o. daily before breakfast.     - We discussed about the correct intake of her thyroid hormone, on empty stomach at fasting, with water, separated by at least 30 minutes from breakfast and other medications,  and separated by more than 4 hours from calcium, iron, multivitamins, acid reflux medications (PPIs). -Patient is made aware of the fact that thyroid hormone replacement is needed for life, dose to be adjusted by periodic monitoring of thyroid function tests.   She is encouraged to maintain close follow-up with her oncology service.  - I advised patient to maintain close follow up with [facility] for primary care needs.   I spent 21 minutes in the care of the patient today including review of labs from Thyroid Function, CMP, and other relevant labs ; imaging/biopsy records (current and previous including abstractions from other facilities); face-to-face time discussing  her lab results and symptoms, medications doses, her options of short and long term treatment based on the latest standards of care / guidelines;   and documenting the encounter.  [employer]  participated in the discussions, expressed understanding, and voiced agreement with the above plans.  All questions were answered to her satisfaction. she is encouraged to contact clinic should she have any questions or concerns prior to her return visit.   Follow up plan: Return in about 3 months (around 03/16/2022) for NV with Dayanis Bergquist, F/U with Pre-visit Labs.  Glade Lloyd, MD Phone: [phone]   Fax: [phone]  -  This note was partially dictated with voice recognition software. Similar sounding words can be transcribed inadequately or may not  be corrected upon review.  12/14/2021, 12:45 PM

## 2021-12-20 ENCOUNTER — Telehealth: Payer: Self-pay | Admitting: *Deleted

## 2022-02-13 ENCOUNTER — Ambulatory Visit: Payer: BC Managed Care – PPO

## 2022-02-17 ENCOUNTER — Telehealth: Payer: Self-pay | Admitting: *Deleted

## 2022-02-20 ENCOUNTER — Telehealth: Payer: Self-pay | Admitting: Hematology and Oncology

## 2022-02-20 ENCOUNTER — Encounter: Payer: BC Managed Care – PPO | Admitting: *Deleted

## 2022-02-20 ENCOUNTER — Ambulatory Visit: Payer: BC Managed Care – PPO | Admitting: Hematology and Oncology

## 2022-02-20 NOTE — Telephone Encounter (Signed)
Rescheduled appointment per providers. Patient aware.  ? ?

## 2022-02-24 ENCOUNTER — Telehealth: Payer: Self-pay | Admitting: Hematology and Oncology

## 2022-02-24 NOTE — Telephone Encounter (Signed)
.  Called pt per 5/19 inbasket , Patient was unavailable, a message with appt time and date was left with number on file.

## 2022-02-27 ENCOUNTER — Ambulatory Visit: Payer: BC Managed Care – PPO | Attending: General Surgery

## 2022-02-27 VITALS — Wt 120.4 lb

## 2022-02-27 DIAGNOSIS — Z483 Aftercare following surgery for neoplasm: Secondary | ICD-10-CM | POA: Insufficient documentation

## 2022-02-27 NOTE — Therapy (Signed)
  OUTPATIENT PHYSICAL THERAPY SOZO SCREENING NOTE   Patient Name: Sophia Moore MRN: [id number] DOB:[birth year], 41 y.o., female Today's Date: 02/27/2022  PCP: [place] [place]: Rolm Bookbinder, MD   PT End of Session - 02/27/22 1620     Visit Number 8   # unchanged due to screen only   PT Start Time 1618    PT Stop Time 1623    PT Time Calculation (min) 5 min    Behavior During Therapy [facility] for tasks assessed/performed             Past Medical History:  Diagnosis Date   Depression    takes Citalopram daily   Family history of breast cancer    Thyroid nodule 07/2016   Past Surgical History:  Procedure Laterality Date   IR IMAGING GUIDED PORT INSERTION  06/27/2021   IR REMOVAL TUN ACCESS W/ PORT W/O FL MOD SED  11/29/2021   MASTECTOMY W/ SENTINEL NODE BIOPSY Right 06/01/2021   Procedure: RIGHT MASTECTOMY WITH AXILLARY SENTINEL LYMPH NODE BIOPSY;  Surgeon: Rolm Bookbinder, MD;  Location: [facility];  Service: General;  Laterality: Right;   THYROIDECTOMY  09/13/2016   THYROIDECTOMY N/A 09/13/2016   Procedure: TOTAL THYROIDECTOMY;  Surgeon: Leta Baptist, MD;  Location: [facility];  Service: ENT;  Laterality: N/A;   wisdom teeth extracted      WISDOM TOOTH EXTRACTION     Patient Active Problem List   Diagnosis Date Noted   S/P mastectomy, right 06/01/2021   Genetic testing 05/11/2021   Family history of breast cancer 05/04/2021   Malignant neoplasm of upper-outer quadrant of right breast in female, estrogen receptor positive ([place]) 05/02/2021   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   H/O total thyroidectomy 41/66/0630   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 She  underwent a right mastectomy and sentinel node biopsy (2 of 2 positive nodes) on 06/01/2021.  It is ER/PR positive and HER2 negative with a Ki67 of 5%. She had a thyroidectomy in 2017.  PRECAUTIONS: right UE Lymphedema risk, None  SUBJECTIVE: Pt returns for her 3 month L-Dex screen.   PAIN:  Are you having pain? No  SOZO SCREENING: Patient was assessed today using the SOZO machine to determine the lymphedema index score. This was compared to her baseline score. It was determined that she is within the recommended range when compared to her baseline and no further action is needed at this time. She will continue SOZO screenings. These are done every 3 months for 2 years post operatively followed by every 6 months for 2 years, and then annually.    [name], PTA 02/27/2022, 4:22 PM

## 2022-03-10 ENCOUNTER — Other Ambulatory Visit: Payer: Self-pay | Admitting: "Endocrinology

## 2022-03-14 ENCOUNTER — Other Ambulatory Visit: Payer: Self-pay | Admitting: Oncology

## 2022-03-16 ENCOUNTER — Telehealth: Payer: Self-pay | Admitting: *Deleted

## 2022-03-16 ENCOUNTER — Telehealth: Payer: BC Managed Care – PPO | Admitting: Hematology and Oncology

## 2022-03-16 ENCOUNTER — Inpatient Hospital Stay: Payer: BC Managed Care – PPO | Attending: Hematology and Oncology | Admitting: *Deleted

## 2022-03-16 ENCOUNTER — Inpatient Hospital Stay (HOSPITAL_BASED_OUTPATIENT_CLINIC_OR_DEPARTMENT_OTHER): Payer: BC Managed Care – PPO | Admitting: Hematology and Oncology

## 2022-03-16 DIAGNOSIS — C50411 Malignant neoplasm of upper-outer quadrant of right female breast: Secondary | ICD-10-CM

## 2022-03-16 DIAGNOSIS — Z170 Estrogen receptor positive status [ER+]: Secondary | ICD-10-CM

## 2022-03-16 NOTE — Progress Notes (Signed)
[place]  [phone] [phone]) [phone]    ID: Sophia Moore DOB: 1981-08-03  MR#: [id number]  NOI#:[id number]  Patient Care Team: [facility] as PCP - General (Family Medicine) Mauro Kaufmann, RN as Oncology Nurse Navigator Rockwell Germany, RN as Oncology Nurse Navigator Rolm Bookbinder, MD as Consulting Physician (General Surgery) Eppie Gibson, MD as Attending Physician (Radiation Oncology) Benay Pike, MD as Consulting Physician (Hematology and Oncology) Delice Bison, Charlestine Massed, NP as Nurse Practitioner (Hematology and Oncology) Harmon Pier, RN as Registered Nurse Benay Pike, MD  OTHER MD:  CHIEF COMPLAINT: Estrogen receptor positive breast cancer  CURRENT TREATMENT: adjvuant chemotherapy   INTERVAL HISTORY: [name] returns today for telephone follow up.    Rest of the pertinent 10 point ROS reviewed and negative.  REVIEW OF SYSTEMS:   COVID 19 VACCINATION STATUS: not vaccinated, infection 05/2019 (results in [place])   HISTORY OF CURRENT ILLNESS: From the original intake note:  Sophia Moore had routine screening mammography (her first ever) on 03/22/2021 showing a possible abnormality in the right breast. She underwent right diagnostic mammography with tomography and right breast ultrasonography at [facility] on 04/19/2021 showing: breast density category C; palpable 6.3 cm mass involving the central and upper half of the right breast; 2-4 satellite nodules within upper-outer quadrant; no right axillary adenopathy.  Accordingly on 04/26/2021 she proceeded to biopsy of the right breast area in question. 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 Proliferation marker Ki67 at 5%. HER2 equivocal by immunohistochemistry (2+), but negative by fluorescent in situ hybridization with a signals ratio 1.2 and number per cell 1.8. 2. Right Breast, upper-outer axillary tail  - flat epithelial atypia with calcifications   Cancer Staging  Malignant neoplasm of upper-outer quadrant of right breast in female, estrogen receptor positive ([place]) Staging form: Breast, AJCC 8th Edition - Clinical stage from 05/04/2021: Stage IIA (cT3, cN0, cM0, G2, ER+, PR+, HER2-) - Signed by Chauncey Cruel, MD on 05/04/2021 Stage prefix: Initial diagnosis Histologic grading system: 3 grade system Laterality: Right Staged by: Pathologist and managing physician Stage used in treatment planning: Yes National guidelines used in treatment planning: Yes Type of national guideline used in treatment planning: NCCN   The patient's subsequent history is as detailed below.  PAST MEDICAL HISTORY: Past Medical History:  Diagnosis Date   Depression    takes Citalopram daily   Family history of breast cancer    Thyroid nodule 07/2016    PAST SURGICAL HISTORY: Past Surgical History:  Procedure Laterality Date   IR IMAGING GUIDED PORT INSERTION  06/27/2021   IR REMOVAL TUN ACCESS W/ PORT W/O FL MOD SED  11/29/2021   MASTECTOMY W/ SENTINEL NODE BIOPSY Right 06/01/2021   Procedure: RIGHT MASTECTOMY WITH AXILLARY SENTINEL LYMPH NODE BIOPSY;  Surgeon: Rolm Bookbinder, MD;  Location: [place];  Service: General;  Laterality: Right;   THYROIDECTOMY  09/13/2016   THYROIDECTOMY N/A 09/13/2016   Procedure: TOTAL THYROIDECTOMY;  Surgeon: Leta Baptist, MD;  Location: [facility];  Service: ENT;  Laterality: N/A;   wisdom teeth extracted      WISDOM TOOTH EXTRACTION      FAMILY HISTORY: Family History  Problem Relation Age of Onset   Hyperlipidemia Mother  Stroke Mother    Breast cancer  Maternal Aunt        dx 61s   Her parents are both living, her father age 37 and her mother age 41, as of 04/2021. Meilyn has two brothers and three sisters. She reports breast cancer in a maternal aunt (age 3+).   GYNECOLOGIC HISTORY:  No LMP recorded. Menarche: unsure Age at first live birth: 41 years old Sophia Moore 3 LMP 04/13/2021 Contraceptive never used HRT n/a  Hysterectomy? no BSO? 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  She is originally from the [place] section of [place].  Husband Efren work in Architect. She lives at home with daughter Sophia Moore, age 59, and [name], age 55. Her oldest [name], age 70, works at [employer]. She attends a [employer].    ADVANCED DIRECTIVES: In the absence of any documentation to the contrary, the patient's spouse is their HCPOA.    HEALTH MAINTENANCE: Social History   Tobacco Use   Smoking status: Never   Smokeless tobacco: Never  Vaping Use   Vaping Use: Never used  Substance Use Topics   Alcohol use: No   Drug use: No     Colonoscopy: n/a (age)  PAP: 03/2021  Bone density: n/a (age)   Allergies  Allergen Reactions   Amoxicillin Rash   Penicillins Rash    Has patient had a PCN reaction causing immediate rash, facial/tongue/throat swelling, SOB or lightheadedness with hypotension: Yes Has patient had a PCN reaction causing severe rash involving mucus membranes or skin necrosis: No Has patient had a PCN reaction that required hospitalization No Has patient had a PCN reaction occurring within the last 10 years: Yes If all of the above answers are "NO", then may proceed with Cephalosporin use.     Current Outpatient Medications  Medication Sig Dispense Refill   cholecalciferol (VITAMIN D3) 25 MCG (1000 UNIT) tablet Take 1,000 Units by mouth daily.     citalopram (CELEXA) 20 MG tablet Take 20 mg by mouth  daily.     levothyroxine (SYNTHROID) 88 MCG tablet TAKE 1 TABLET BY MOUTH EVERY DAY 90 tablet 0   olopatadine (PATADAY) 0.1 % ophthalmic solution Place 1 drop into both eyes 2 (two) times daily. 5 mL 0   tamoxifen (NOLVADEX) 20 MG tablet Take 1 tablet (20 mg total) by mouth daily. 30 tablet 3   No current facility-administered medications for this visit.    OBJECTIVE: Spanish speaker in no acute distress  There were no vitals filed for this visit.    There is no height or weight on file to calculate BMI.   Wt Readings from Last 3 Encounters:  02/27/22 120 lb 6 oz (54.6 kg)  12/14/21 117 lb 12.8 oz (53.4 kg)  11/22/21 120 lb 4.8 oz (54.6 kg)  ECOG FS:1 - Symptomatic but completely ambulatory   LAB RESULTS:  CMP     Component Value Date/Time   NA 143 10/04/2021 1124   NA 143 09/09/2021 0834   K 3.8 10/04/2021 1124   CL 107 10/04/2021 1124   CO2 30 10/04/2021 1124   GLUCOSE 70 10/04/2021 1124   BUN 13 10/04/2021 1124   BUN 12 09/09/2021 0834   CREATININE 0.57 10/04/2021 1124   CREATININE 0.62 08/16/2021 0939   CALCIUM 9.7 10/04/2021 1124   PROT 6.6 10/04/2021 1124   PROT  5.8 (L) 09/09/2021 0834   ALBUMIN 4.1 10/04/2021 1124   ALBUMIN 3.8 09/09/2021 0834   AST 28 10/04/2021 1124   AST 16 08/16/2021 0939   ALT 38 10/04/2021 1124   ALT 32 08/16/2021 0939   ALKPHOS 71 10/04/2021 1124   BILITOT 0.7 10/04/2021 1124   BILITOT 0.3 09/09/2021 0834   BILITOT 0.4 08/16/2021 0939   GFRNONAA >60 10/04/2021 1124   GFRNONAA >60 08/16/2021 0939    No results found for: "TOTALPROTELP", "ALBUMINELP", "A1GS", "A2GS", "BETS", "BETA2SER", "GAMS", "MSPIKE", "SPEI"  Lab Results  Component Value Date   WBC 5.8 10/04/2021   NEUTROABS 4.4 10/04/2021   HGB 13.4 10/04/2021   HCT 40.2 10/04/2021   MCV 89.9 10/04/2021   PLT 289 10/04/2021    No results found for: "LABCA2"  No components found for: "IFOYDX412"  No results for input(s): "INR" in the last 168 hours.  No results found  for: "LABCA2"  No results found for: "INO676"  No results found for: "CAN125"  No results found for: "CAN153"  No results found for: "CA2729"  No components found for: "HGQUANT"  No results found for: "CEA1", "CEA" / No results found for: "CEA1", "CEA"   No results found for: "AFPTUMOR"  No results found for: "CHROMOGRNA"  No results found for: "KPAFRELGTCHN", "LAMBDASER", "KAPLAMBRATIO" (kappa/lambda light chains)  No results found for: "HGBA", "HGBA2QUANT", "HGBFQUANT", "HGBSQUAN" (Hemoglobinopathy evaluation)   No results found for: "LDH"  No results found for: "IRON", "TIBC", "IRONPCTSAT" (Iron and TIBC)  No results found for: "FERRITIN"  Urinalysis    Component Value Date/Time   COLORURINE YELLOW 09/12/2021 1238   APPEARANCEUR HAZY (A) 09/12/2021 1238   LABSPEC 1.006 09/12/2021 1238   PHURINE 6.0 09/12/2021 [place] 09/12/2021 1238   HGBUR SMALL (A) 09/12/2021 [place] 09/12/2021 [place] 09/12/2021 [place] 09/12/2021 1238   NITRITE NEGATIVE 09/12/2021 1238   LEUKOCYTESUR SMALL (A) 09/12/2021 1238    STUDIES: No results found.   ELIGIBLE FOR AVAILABLE RESEARCH PROTOCOL: no  ASSESSMENT: 41 y.o. Spanish speaker status post right breast upper outer quadrant biopsy 04/26/2021 for a clinical mT3 N0, stage IIA invasive ductal carcinoma, grade 1 or 2, estrogen and progesterone receptor positive, HER2 not amplified, with an MIB-1 of 5%.  (1) genetics testing 05/16/2021 through the [employer] panel (report date 05/11/2021) or CancerNext-Expanded + RNAinsight panel (report date 05/16/2021).   The BRCAplus panel offered by [employer] and includes sequencing and deletion/duplication analysis for the following 8 genes: ATM, BRCA1, BRCA2, CDH1, CHEK2, PALB2, PTEN, and TP53. The CancerNext-Expanded + RNAinsight gene panel offered by [employer] and includes sequencing and rearrangement analysis  for the following 77 genes: AIP, ALK, APC, ATM, AXIN2, BAP1, BARD1, BLM, BMPR1A, BRCA1, BRCA2, BRIP1, CDC73, CDH1, CDK4, CDKN1B, CDKN2A, CHEK2, CTNNA1, DICER1, FANCC, FH, FLCN, GALNT12, KIF1B, LZTR1, MAX, MEN1, MET, MLH1, MSH2, MSH3, MSH6, MUTYH, NBN, NF1, NF2, NTHL1, PALB2, PHOX2B, PMS2, POT1, PRKAR1A, PTCH1, PTEN, RAD51C, RAD51D, RB1, RECQL, RET, SDHA, SDHAF2, SDHB, SDHC, SDHD, SMAD4, SMARCA4, SMARCB1, SMARCE1, STK11, SUFU, TMEM127, TP53, TSC1, TSC2, VHL and XRCC2 (sequencing and deletion/duplication); EGFR, EGLN1, HOXB13, KIT, MITF, PDGFRA, POLD1 and POLE (sequencing only); EPCAM and GREM1 (deletion/duplication only). RNA data is routinely analyzed for use in variant interpretation for all genes.  (A) A variant of uncertain significance (VUS) was detected in the [facility] gene called Moore.[id number] (c.4106G>A).  (2) MammaPrint obtained from the 04/26/2021 biopsy returned luminal a type, low  risk, with a predicted benefit from additional chemotherapy in the 1-2% range [given the patient's young age however, the benefit of chemotherapy might be as high as 5%].  (3) status post right mastectomy and sentinel lymph node sampling 06/01/2021 for an mpT3 pN1-2, stage IIA invasive ductal carcinoma, grade 1, with negative margins  (A) a total of 2 right axillary lymph nodes were removed, both positive, 1 with extracapsular extension  (B) completion axillary dissection being considered  (3) adjuvant chemotherapy with docetaxel, cyclophosphamide given on day 1 of a 21 day cycle x 4 from 06/30/2021 through 09/01/2021  (4) adjuvant radiation completed 11/08/2021  (5) tamoxifen started 05/04/2021 in anticipation of surgical delays; held during chemotherapy.  #6 she completed adjuvant chemotherapy on 08/31/2021   PLAN: Patient didn't answer the call Please reschedule in person.    Benay Pike MD

## 2022-03-17 ENCOUNTER — Telehealth: Payer: Self-pay | Admitting: *Deleted

## 2022-03-17 ENCOUNTER — Inpatient Hospital Stay: Payer: BC Managed Care – PPO | Admitting: *Deleted

## 2022-03-17 ENCOUNTER — Other Ambulatory Visit: Payer: Self-pay | Admitting: *Deleted

## 2022-03-17 LAB — T4, FREE: Free T4: 1.02 ng/dL (ref 0.82–1.77)

## 2022-03-17 LAB — TSH: TSH: 1.6 u[IU]/mL (ref 0.450–4.500)

## 2022-03-20 ENCOUNTER — Encounter: Payer: Self-pay | Admitting: "Endocrinology

## 2022-03-20 ENCOUNTER — Ambulatory Visit (INDEPENDENT_AMBULATORY_CARE_PROVIDER_SITE_OTHER): Payer: BC Managed Care – PPO | Admitting: "Endocrinology

## 2022-03-20 VITALS — BP 96/64 | HR 76 | Ht 61.0 in | Wt 122.4 lb

## 2022-03-20 DIAGNOSIS — E890 Postprocedural hypothyroidism: Secondary | ICD-10-CM | POA: Diagnosis not present

## 2022-03-20 MED ORDER — LEVOTHYROXINE SODIUM 88 MCG PO TABS
88.0000 ug | ORAL_TABLET | Freq: Every day | ORAL | 1 refills | Status: DC
Start: 2022-03-20 — End: 2022-09-19

## 2022-03-20 NOTE — Progress Notes (Signed)
03/20/2022  Endocrinology follow-up note   Subjective:    Patient ID: Sophia Moore, female    DOB: April 15, 1981, PCP [facility]   Past Medical History:  Diagnosis Date   Depression    takes Citalopram daily   Family history of breast cancer    Thyroid nodule 07/2016   Past Surgical History:  Procedure Laterality Date   IR IMAGING GUIDED PORT INSERTION  06/27/2021   IR REMOVAL TUN ACCESS W/ PORT W/O FL MOD SED  11/29/2021   MASTECTOMY W/ SENTINEL NODE BIOPSY Right 06/01/2021   Procedure: RIGHT MASTECTOMY WITH AXILLARY SENTINEL LYMPH NODE BIOPSY;  Surgeon: Rolm Bookbinder, MD;  Location: [place];  Service: General;  Laterality: Right;   THYROIDECTOMY  09/13/2016   THYROIDECTOMY N/A 09/13/2016   Procedure: TOTAL THYROIDECTOMY;  Surgeon: Leta Baptist, MD;  Location: [facility];  Service: ENT;  Laterality: N/A;   wisdom teeth extracted      WISDOM TOOTH EXTRACTION     Social History   Socioeconomic History   Marital status: Married    Spouse name: Not on file   Number of children: Not on file   Years of education: Not on file   Highest education level: Not on file  Occupational History   Not on file  Tobacco Use   Smoking status: Never   Smokeless tobacco: Never  Vaping Use   Vaping Use: Never used  Substance and Sexual Activity   Alcohol use: No   Drug use: No   Sexual activity: Not Currently    Birth control/protection: None  Other Topics Concern   Not on file  Social History Narrative   Not on file   Social Determinants of Health   Financial Resource Strain: High Risk (07/20/2021)   Overall Financial Resource Strain (CARDIA)    Difficulty of Paying Living Expenses: Very hard  Food Insecurity: Not on file  Transportation Needs: No Transportation Needs (05/04/2021)   PRAPARE - Transportation    Lack of Transportation (Medical): No    Lack of Transportation (Non-Medical): No  Physical Activity: Not on file   Stress: Not on file  Social Connections: Not on file   Outpatient Encounter Medications as of 03/20/2022  Medication Sig   cholecalciferol (VITAMIN D3) 25 MCG (1000 UNIT) tablet Take 1,000 Units by mouth daily.   citalopram (CELEXA) 20 MG tablet Take 20 mg by mouth daily.   levothyroxine (SYNTHROID) 88 MCG tablet Take 1 tablet (88 mcg total) by mouth daily.   olopatadine (PATADAY) 0.1 % ophthalmic solution Place 1 drop into both eyes 2 (two) times daily.   tamoxifen (NOLVADEX) 20 MG tablet Take 1 tablet (20 mg total) by mouth daily.   [DISCONTINUED] levothyroxine (SYNTHROID) 88 MCG tablet TAKE 1 TABLET BY MOUTH EVERY DAY   No facility-administered encounter medications on file as of 03/20/2022.   ALLERGIES: Allergies  Allergen Reactions   Amoxicillin Rash   Penicillins Rash    Has patient had a PCN reaction causing immediate rash, facial/tongue/throat swelling, SOB or lightheadedness with hypotension: Yes Has patient had a PCN reaction causing severe rash involving mucus membranes or skin necrosis: No Has patient had a PCN reaction that required hospitalization No Has patient had a PCN reaction occurring within the last 10 years: Yes If all of the above answers are "NO", then may proceed with Cephalosporin use.    VACCINATION STATUS: Immunization History  Administered Date(s) Administered   PPD Test 04/24/2016   Tdap 07/02/2020  HPI 41 year old female patient with medical history as above. 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 - Her surgical pathology was negative for malignancy.  She is currently on thyroid hormone replacement, levothyroxine 88 mcg p.o. daily before breakfast.   Her previsit thyroid function tests are consistent with appropriate replacement.    -Prior to her last visit, she was diagnosed with breast cancer s/p surgery and chemotherapy.  She reports symptoms of fatigue, and  few pounds of weight gain. She denies palpitations, tremors, nor heat intolerance.  - She denies family history of thyroid dysfunction. She denies exposure to neck radiation. She denies heat/cold intolerance. She has steady body weight. Denies dysphagia, shortness of breath, nor voice change.  Review of Systems  Constitutional:  + Slightly fluctuating heart rate,  no fatigue, no subjective hyperthermia/hypothermia   Objective:    BP 96/64   Pulse 76   Ht '5\' 1"'$  (1.549 m)   Wt 122 lb 6.4 oz (55.5 kg)   BMI 23.13 kg/m   Wt Readings from Last 3 Encounters:  03/20/22 122 lb 6.4 oz (55.5 kg)  02/27/22 120 lb 6 oz (54.6 kg)  12/14/21 117 lb 12.8 oz (53.4 kg)      Physical Exam- Limited  Constitutional:  Body mass index is 23.13 kg/m. , not in acute distress, normal state of mind       09/13/2016 surgical pathology of total thyroidectomy-no malignancy. Thyroid, thyroidectomy, Right - FOLLICULAR ADENOMA WITH CALCIFICATIONS, 2.1 CM. - MICROSCOPIC INTRATHYROIDAL PARATHYROID TISSUE. - THERE IS NO EVIDENCE OF MALIGNANCY.  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  January 29, 2018 thyroid/neck ultrasound: Thyroid surgically absent with no evidence of remnant or recurrence.  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    Her surgical sample diagnosis showed no malignancy.    - She did not require radioactive iodine ablative  therapy.   -Regarding her postsurgical hypothyroidism: -Her thyroid function tests are consistent with appropriate replacement.  She is advised to continue levothyroxine 88 mcg p.o. daily before breakfast.     - We discussed about the correct intake of her thyroid hormone, on empty stomach at fasting, with water, separated by at least 30 minutes from breakfast and other medications,  and separated by more than 4 hours from calcium, iron, multivitamins, acid reflux medications (PPIs). -Patient is made aware of the fact that thyroid hormone replacement is needed for life, dose to be adjusted by periodic monitoring of thyroid function tests.   She is encouraged to maintain close follow-up with her oncology service.  - I advised patient to maintain close follow up with [facility] for primary care needs.    I spent 22 minutes in the care of the patient today including review of labs from Thyroid Function, CMP, and other relevant  labs ; imaging/biopsy records (current and previous including abstractions from other facilities); face-to-face time discussing  her lab results and symptoms, medications doses, her options of short and long term treatment based on the latest standards of care / guidelines;   and documenting the encounter.  [employer]  participated in the discussions, expressed understanding, and voiced agreement with the above plans.  All questions were answered to her satisfaction. she is encouraged to contact clinic should she have any questions or concerns prior to her return visit.   Follow up plan: Return in about 6 months (around 09/19/2022) for F/U with Pre-visit Labs.  Glade Lloyd, MD Phone: [phone]  Fax: [phone]  -  This note was partially dictated with voice recognition software. Similar sounding words can be transcribed inadequately or may not  be corrected upon review.  03/20/2022, 1:07 PM

## 2022-03-21 ENCOUNTER — Telehealth: Payer: Self-pay | Admitting: Hematology and Oncology

## 2022-03-21 NOTE — Telephone Encounter (Signed)
Scheduled appointment per provider request. Patient aware.  ?

## 2022-03-23 ENCOUNTER — Other Ambulatory Visit: Payer: Self-pay | Admitting: Hematology and Oncology

## 2022-03-23 ENCOUNTER — Ambulatory Visit
Admission: RE | Admit: 2022-03-23 | Discharge: 2022-03-23 | Disposition: A | Discharge status: Home / Self Care | Payer: BC Managed Care – PPO | Source: Ambulatory Visit | Attending: Hematology and Oncology | Admitting: Hematology and Oncology

## 2022-03-23 DIAGNOSIS — Z170 Estrogen receptor positive status [ER+]: Secondary | ICD-10-CM

## 2022-03-23 DIAGNOSIS — Z1231 Encounter for screening mammogram for malignant neoplasm of breast: Secondary | ICD-10-CM

## 2022-03-23 HISTORY — DX: Personal history of antineoplastic chemotherapy: Z92.21

## 2022-03-23 HISTORY — DX: Personal history of irradiation: Z92.3

## 2022-03-24 ENCOUNTER — Other Ambulatory Visit: Payer: Self-pay | Admitting: Hematology and Oncology

## 2022-03-24 DIAGNOSIS — R928 Other abnormal and inconclusive findings on diagnostic imaging of breast: Secondary | ICD-10-CM

## 2022-03-31 ENCOUNTER — Ambulatory Visit
Admission: RE | Admit: 2022-03-31 | Discharge: 2022-03-31 | Disposition: A | Discharge status: Home / Self Care | Payer: BC Managed Care – PPO | Source: Ambulatory Visit | Attending: Hematology and Oncology | Admitting: Hematology and Oncology

## 2022-03-31 DIAGNOSIS — R928 Other abnormal and inconclusive findings on diagnostic imaging of breast: Secondary | ICD-10-CM

## 2022-04-21 ENCOUNTER — Inpatient Hospital Stay: Payer: BC Managed Care – PPO | Attending: Hematology and Oncology | Admitting: Adult Health

## 2022-04-21 ENCOUNTER — Other Ambulatory Visit: Payer: Self-pay

## 2022-04-21 ENCOUNTER — Encounter: Payer: Self-pay | Admitting: Adult Health

## 2022-04-21 VITALS — BP 112/72 | HR 58 | Temp 97.8°F | Resp 16 | Ht 61.0 in | Wt 121.9 lb

## 2022-04-21 DIAGNOSIS — Z79899 Other long term (current) drug therapy: Secondary | ICD-10-CM | POA: Diagnosis not present

## 2022-04-21 DIAGNOSIS — Z9221 Personal history of antineoplastic chemotherapy: Secondary | ICD-10-CM | POA: Insufficient documentation

## 2022-04-21 DIAGNOSIS — Z8349 Family history of other endocrine, nutritional and metabolic diseases: Secondary | ICD-10-CM | POA: Diagnosis not present

## 2022-04-21 DIAGNOSIS — Z823 Family history of stroke: Secondary | ICD-10-CM | POA: Diagnosis not present

## 2022-04-21 DIAGNOSIS — C50411 Malignant neoplasm of upper-outer quadrant of right female breast: Secondary | ICD-10-CM | POA: Insufficient documentation

## 2022-04-21 DIAGNOSIS — Z880 Allergy status to penicillin: Secondary | ICD-10-CM | POA: Insufficient documentation

## 2022-04-21 DIAGNOSIS — Z923 Personal history of irradiation: Secondary | ICD-10-CM | POA: Diagnosis not present

## 2022-04-21 DIAGNOSIS — Z170 Estrogen receptor positive status [ER+]: Secondary | ICD-10-CM | POA: Insufficient documentation

## 2022-04-21 DIAGNOSIS — Z5986 Financial insecurity: Secondary | ICD-10-CM | POA: Insufficient documentation

## 2022-04-21 DIAGNOSIS — Z803 Family history of malignant neoplasm of breast: Secondary | ICD-10-CM | POA: Diagnosis not present

## 2022-04-21 NOTE — Progress Notes (Signed)
[facility] Follow up:    [place], [place] At [address] [place] [address]   DIAGNOSIS:  Cancer Staging  Malignant neoplasm of upper-outer quadrant of right breast in female, estrogen receptor positive ([place]) Staging form: Breast, AJCC 8th Edition - Clinical stage from 05/04/2021: Stage IIA (cT3, cN0, cM0, G2, ER+, PR+, HER2-) - Signed by Chauncey Cruel, MD on 05/04/2021 Stage prefix: Initial diagnosis Histologic grading system: 3 grade system Laterality: Right Staged by: Pathologist and managing physician Stage used in treatment planning: Yes National guidelines used in treatment planning: Yes Type of national guideline used in treatment planning: NCCN   SUMMARY OF ONCOLOGIC HISTORY: Oncology History  Malignant neoplasm of upper-outer quadrant of right breast in female, estrogen receptor positive ([place])  05/02/2021 Initial Diagnosis   Malignant neoplasm of upper-outer quadrant of right breast in female, estrogen receptor positive ([place])   05/04/2021 Cancer Staging   Staging form: Breast, AJCC 8th Edition - Clinical stage from 05/04/2021: Stage IIA (cT3, cN0, cM0, G2, ER+, PR+, HER2-) - Signed by Chauncey Cruel, MD on 05/04/2021 Stage prefix: Initial diagnosis Histologic grading system: 3 grade system Laterality: Right Staged by: Pathologist and managing physician Stage used in treatment planning: Yes National guidelines used in treatment planning: Yes Type of national guideline used in treatment planning: NCCN   05/11/2021 Genetic Testing   Negative genetic testing:  No pathogenic variants detected on the [employer] BRCAplus panel (report date 05/11/2021) or CancerNext-Expanded + RNAinsight panel (report date 05/16/2021). A variant of uncertain significance (VUS) was detected in the [facility] gene called p.R1369H (c.4106G>A).  The BRCAplus panel offered by [employer] and includes sequencing and deletion/duplication analysis  for the following 8 genes: ATM, BRCA1, BRCA2, CDH1, CHEK2, PALB2, PTEN, and TP53. The CancerNext-Expanded + RNAinsight gene panel offered by [employer] and includes sequencing and rearrangement analysis for the following 77 genes: AIP, ALK, APC, ATM, AXIN2, BAP1, BARD1, BLM, BMPR1A, BRCA1, BRCA2, BRIP1, CDC73, CDH1, CDK4, CDKN1B, CDKN2A, CHEK2, CTNNA1, DICER1, FANCC, FH, FLCN, GALNT12, KIF1B, LZTR1, MAX, MEN1, MET, MLH1, MSH2, MSH3, MSH6, MUTYH, NBN, NF1, NF2, NTHL1, PALB2, PHOX2B, PMS2, POT1, PRKAR1A, PTCH1, PTEN, RAD51C, RAD51D, RB1, RECQL, RET, SDHA, SDHAF2, SDHB, SDHC, SDHD, SMAD4, SMARCA4, SMARCB1, SMARCE1, STK11, SUFU, TMEM127, TP53, TSC1, TSC2, VHL and XRCC2 (sequencing and deletion/duplication); EGFR, EGLN1, HOXB13, KIT, MITF, PDGFRA, POLD1 and POLE (sequencing only); EPCAM and GREM1 (deletion/duplication only). RNA data is routinely analyzed for use in variant interpretation for all genes.   06/01/2021 Surgery   status post right mastectomy and sentinel lymph node sampling 06/01/2021 for an mpT3 pN1-2, stage IIA invasive ductal carcinoma, grade 1, with negative margins             (A) a total of 2 right axillary lymph nodes were removed, both positive, 1 with extracapsular extension             (B) completion axillary dissection being considered   06/30/2021 - 09/02/2021 Chemotherapy   Patient is on Treatment Plan : BREAST TC q21d     09/26/2021 - 11/08/2021 Radiation Therapy   09/26/2021 through 11/08/2021 Site Technique Total Dose (Gy) Dose per Fx (Gy) Completed Fx Beam Energies  Chest Wall, Right: CW_R_IMN 3D 50/50 2 25/25 6X  Chest Wall, Right: CW_R_PAB_SCV 3D 50/50 2 25/25 6X, 10X  Chest Wall, Right: CW_R_Bst Electron 10/10 2 5/5 6E    11/2021 -  Anti-estrogen oral therapy   Tamoxifen daily     CURRENT THERAPY:  INTERVAL HISTORY: Sophia Moore 41 y.o. female returns for f/u of her h/o bresat cancer.  Her most recent left breast screening mammogram was completed on  03/23/2022 which recommended diagnostic mammogram and ultrasound that was subsequently completed on 03/31/2022 that showed no evidence of malignancy and breast density category C.  Repeat mammogram was recommended to occur in 03/2023.  She is taking Tamoxifen daily however notes that since May she has been feeling increasingly anxious and on edge.  She is unsure what to do about this, but notes she is increasingly fearful particularly when driving.     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  MEDICAL HISTORY: Past Medical History:  Diagnosis Date   Depression    takes Citalopram daily   Family history of breast cancer    Personal history of chemotherapy    Personal history of radiation therapy    Thyroid nodule 07/2016    SURGICAL HISTORY: Past Surgical History:  Procedure Laterality Date   BREAST BIOPSY Right 04/2021   IR IMAGING GUIDED PORT INSERTION  06/27/2021   IR REMOVAL TUN ACCESS W/ PORT W/O FL MOD SED  11/29/2021   MASTECTOMY Right 05/2021   MASTECTOMY W/ SENTINEL NODE BIOPSY Right 06/01/2021   Procedure: RIGHT MASTECTOMY WITH AXILLARY SENTINEL LYMPH NODE BIOPSY;  Surgeon: Rolm Bookbinder, MD;  Location: [facility];  Service: General;  Laterality: Right;   THYROIDECTOMY  09/13/2016   THYROIDECTOMY N/A 09/13/2016   Procedure: TOTAL THYROIDECTOMY;  Surgeon: Leta Baptist, MD;  Location: [facility] OR;  Service: ENT;  Laterality: N/A;   wisdom teeth extracted      WISDOM TOOTH EXTRACTION      SOCIAL HISTORY: Social History   Socioeconomic History   Marital status:  Married    Spouse name: Not on file   Number of children: Not on file   Years of education: Not on file   Highest education level: Not on file  Occupational History   Not on file  Tobacco Use   Smoking status: Never   Smokeless tobacco: Never  Vaping Use   Vaping Use: Never used  Substance and Sexual Activity   Alcohol use: No   Drug use: No   Sexual activity: Not Currently    Birth control/protection: None  Other Topics Concern   Not on file  Social History Narrative   Not on file   Social Determinants of Health   Financial Resource Strain: High Risk (07/20/2021)   Overall Financial Resource Strain (CARDIA)    Difficulty of Paying Living Expenses: Very hard  Food Insecurity: Not on file  Transportation Needs: No Transportation Needs (05/04/2021)   PRAPARE - Transportation    Lack of Transportation (Medical): No    Lack of Transportation (Non-Medical): No  Physical Activity: Not on file  Stress: Not on file  Social Connections: Not on file  Intimate Partner Violence: Not on file    FAMILY HISTORY: Family History  Problem Relation Age of Onset   Hyperlipidemia Mother    Stroke Mother    Breast cancer Maternal Aunt        dx 78s  Review of Systems  Constitutional:  Negative for appetite change, chills, fatigue, fever and unexpected weight change.  HENT:   Negative for hearing loss, lump/mass and trouble swallowing.   Eyes:  Negative for eye problems and icterus.  Respiratory:  Negative for chest tightness, cough and shortness of breath.   Cardiovascular:  Negative for chest pain, leg swelling and palpitations.  Gastrointestinal:  Negative for abdominal distention, abdominal pain, constipation, diarrhea, nausea and vomiting.  Endocrine: Negative for hot flashes.  Genitourinary:  Negative for difficulty urinating.   Musculoskeletal:  Negative for arthralgias.  Skin:  Negative for itching and rash.  Neurological:  Negative for dizziness, extremity weakness,  headaches and numbness.  Hematological:  Negative for adenopathy. Does not bruise/bleed easily.  Psychiatric/Behavioral:  Negative for depression. The patient is nervous/anxious.       PHYSICAL EXAMINATION  ECOG PERFORMANCE STATUS: 1 - Symptomatic but completely ambulatory  Vitals:   04/21/22 0927  BP: 112/72  Pulse: (!) 58  Resp: 16  Temp: 97.8 F (36.6 C)  SpO2: 100%    Physical Exam Constitutional:      General: She is not in acute distress.    Appearance: Normal appearance. She is not toxic-appearing.  HENT:     Head: Normocephalic and atraumatic.  Eyes:     General: No scleral icterus. Cardiovascular:     Rate and Rhythm: Normal rate and regular rhythm.     Pulses: Normal pulses.     Heart sounds: Normal heart sounds.  Pulmonary:     Effort: Pulmonary effort is normal.     Breath sounds: Normal breath sounds.  Chest:     Comments: Right breast s/p mastectomy and radiation, no sign of local recurrence, left breast benign Abdominal:     General: Abdomen is flat. Bowel sounds are normal. There is no distension.     Palpations: Abdomen is soft.     Tenderness: There is no abdominal tenderness.  Musculoskeletal:        General: No swelling.     Cervical back: Neck supple.  Lymphadenopathy:     Cervical: No cervical adenopathy.  Skin:    General: Skin is warm and dry.     Findings: No rash.  Neurological:     General: No focal deficit present.     Mental Status: She is alert.  Psychiatric:        Mood and Affect: Mood normal.        Behavior: Behavior normal.     LABORATORY DATA:  None for this visit  ASSESSMENT and THERAPY PLAN:   Malignant neoplasm of upper-outer quadrant of right breast in female, estrogen receptor positive ([place]) Ymani is a 41 year old woman with stage IIA estrogen positive breast cancer s/p mastectomy, adjuvant chemotherapy, adjuvant radiation therapy and antiestrogen with Tamoxifen beginning in 11/2021.   Bibi is doing well  today.  She has no clinical or radiographic sign of breast cancer recurrence.  She will continue on tamoxifen, however due to the increased anxiety she has been experiencing, we discussed a couple of different options including taking a 2 week break from the tamoxifen, decreasing the tamoxifen to 89m for 2 weeks, or keeping tamoxifen the same and starting effexor xr 37.541mdaily.    After discussion, [place] decrease her tamoxifen to 1041mnd will call us [place] 2 weeks to tell us [place] she is doing.  If it is no better she may need a break to see if it really is the  culprit, or if there is an alternate etiology.    We discussed health maintenance and wellness promotion.  She is due for her pap smear and f/u with her PCP.  She was encouraged to continue with health diet and exercise.  We will see Morrissa back in 3 months for f/u.      All questions were answered. The patient knows to call the clinic with any problems, questions or concerns. We can certainly see the patient much sooner if necessary.  Total encounter time:20 minutes*in face-to-face visit time, chart review, lab review, care coordination, order entry, and documentation of the encounter time.    Wilber Bihari, NP 04/21/22 10:12 AM Medical Oncology and Hematology [facility] [place], [place] [address] Tel. [phone]    Fax. [phone]  *Total Encounter Time as defined by the Centers for Medicare and Medicaid Services includes, in addition to the face-to-face time of a patient visit (documented in the note above) non-face-to-face time: obtaining and reviewing outside history, ordering and reviewing medications, tests or procedures, care coordination (communications with other health care professionals or caregivers) and documentation in the medical record.

## 2022-04-21 NOTE — Assessment & Plan Note (Signed)
Sophia Moore is a 41 year old woman with stage IIA estrogen positive breast cancer s/p mastectomy, adjuvant chemotherapy, adjuvant radiation therapy and antiestrogen with Tamoxifen beginning in 11/2021.   Sophia Moore is doing well today.  She has no clinical or radiographic sign of breast cancer recurrence.  She will continue on tamoxifen, however due to the increased anxiety she has been experiencing, we discussed a couple of different options including taking a 2 week break from the tamoxifen, decreasing the tamoxifen to '10mg'$  for 2 weeks, or keeping tamoxifen the same and starting effexor xr 37.'5mg'$  daily.    After discussion, Sophia Moore will decrease her tamoxifen to '10mg'$  and will call us in 2 weeks to tell us how she is doing.  If it is no better she may need a break to see if it really is the culprit, or if there is an alternate etiology.    We discussed health maintenance and wellness promotion.  She is due for her pap smear and f/u with her PCP.  She was encouraged to continue with health diet and exercise.  We will see Sophia Moore back in 3 months for f/u.

## 2022-05-20 ENCOUNTER — Other Ambulatory Visit: Payer: Self-pay | Admitting: Hematology and Oncology

## 2022-05-25 ENCOUNTER — Telehealth: Payer: Self-pay | Admitting: *Deleted

## 2022-05-25 NOTE — Telephone Encounter (Signed)
Patient called - Sophia Moore that she felt much better on 10 mg than 20 mg. She'd like to stay on 10 mg. She stated that if called back, please leave message as she had to work after 4.   Per L. Causey's OV note 04/21/22: After discussion, [place] will decrease her tamoxifen to '10mg'$  and will call us in 2 weeks to tell us how she is doing.   Dr. Chryl Heck informed. Per Dr. Chryl Heck, patient can remain on 10 mg dose.  Sophia Moore: Continue 10 mg dose of Tamoxifen. If you need an Rx for 10 mg, call the office.

## 2022-05-29 ENCOUNTER — Ambulatory Visit: Payer: BC Managed Care – PPO | Attending: General Surgery | Admitting: Rehabilitation

## 2022-06-22 NOTE — Telephone Encounter (Signed)
No entry 

## 2022-07-24 ENCOUNTER — Inpatient Hospital Stay: Payer: BC Managed Care – PPO | Attending: Hematology and Oncology | Admitting: Hematology and Oncology

## 2022-07-24 DIAGNOSIS — Z170 Estrogen receptor positive status [ER+]: Secondary | ICD-10-CM | POA: Insufficient documentation

## 2022-07-24 DIAGNOSIS — Z880 Allergy status to penicillin: Secondary | ICD-10-CM | POA: Insufficient documentation

## 2022-07-24 DIAGNOSIS — C50411 Malignant neoplasm of upper-outer quadrant of right female breast: Secondary | ICD-10-CM | POA: Insufficient documentation

## 2022-07-24 DIAGNOSIS — Z79810 Long term (current) use of selective estrogen receptor modulators (SERMs): Secondary | ICD-10-CM | POA: Insufficient documentation

## 2022-07-24 DIAGNOSIS — Z923 Personal history of irradiation: Secondary | ICD-10-CM | POA: Insufficient documentation

## 2022-07-24 DIAGNOSIS — Z823 Family history of stroke: Secondary | ICD-10-CM | POA: Insufficient documentation

## 2022-07-24 DIAGNOSIS — Z803 Family history of malignant neoplasm of breast: Secondary | ICD-10-CM | POA: Insufficient documentation

## 2022-07-24 DIAGNOSIS — Z8349 Family history of other endocrine, nutritional and metabolic diseases: Secondary | ICD-10-CM | POA: Insufficient documentation

## 2022-07-24 DIAGNOSIS — Z79899 Other long term (current) drug therapy: Secondary | ICD-10-CM | POA: Insufficient documentation

## 2022-07-24 DIAGNOSIS — Z5986 Financial insecurity: Secondary | ICD-10-CM | POA: Insufficient documentation

## 2022-07-24 DIAGNOSIS — Z9221 Personal history of antineoplastic chemotherapy: Secondary | ICD-10-CM | POA: Insufficient documentation

## 2022-08-07 ENCOUNTER — Inpatient Hospital Stay (HOSPITAL_BASED_OUTPATIENT_CLINIC_OR_DEPARTMENT_OTHER): Payer: BC Managed Care – PPO | Admitting: Adult Health

## 2022-08-07 ENCOUNTER — Other Ambulatory Visit: Payer: Self-pay

## 2022-08-07 ENCOUNTER — Encounter: Payer: Self-pay | Admitting: Adult Health

## 2022-08-07 VITALS — BP 114/67 | HR 55 | Temp 97.9°F | Resp 18 | Ht 61.0 in | Wt 123.2 lb

## 2022-08-07 DIAGNOSIS — C50411 Malignant neoplasm of upper-outer quadrant of right female breast: Secondary | ICD-10-CM

## 2022-08-07 DIAGNOSIS — Z880 Allergy status to penicillin: Secondary | ICD-10-CM | POA: Diagnosis not present

## 2022-08-07 DIAGNOSIS — Z923 Personal history of irradiation: Secondary | ICD-10-CM | POA: Diagnosis not present

## 2022-08-07 DIAGNOSIS — Z79810 Long term (current) use of selective estrogen receptor modulators (SERMs): Secondary | ICD-10-CM | POA: Diagnosis not present

## 2022-08-07 DIAGNOSIS — Z170 Estrogen receptor positive status [ER+]: Secondary | ICD-10-CM

## 2022-08-07 DIAGNOSIS — Z5986 Financial insecurity: Secondary | ICD-10-CM | POA: Diagnosis not present

## 2022-08-07 DIAGNOSIS — Z803 Family history of malignant neoplasm of breast: Secondary | ICD-10-CM | POA: Diagnosis not present

## 2022-08-07 DIAGNOSIS — Z79899 Other long term (current) drug therapy: Secondary | ICD-10-CM | POA: Diagnosis not present

## 2022-08-07 DIAGNOSIS — Z8349 Family history of other endocrine, nutritional and metabolic diseases: Secondary | ICD-10-CM | POA: Diagnosis not present

## 2022-08-07 DIAGNOSIS — Z9221 Personal history of antineoplastic chemotherapy: Secondary | ICD-10-CM | POA: Diagnosis not present

## 2022-08-07 DIAGNOSIS — Z823 Family history of stroke: Secondary | ICD-10-CM | POA: Diagnosis not present

## 2022-08-07 NOTE — Progress Notes (Signed)
Sophia Moore Cancer Follow up:    Sophia Moore, Sophia Moore At [address] [place] [address]   DIAGNOSIS:  Cancer Staging  Malignant neoplasm of upper-outer quadrant of right breast in female, estrogen receptor positive (Sophia Moore) Staging form: Breast, AJCC 8th Edition - Clinical stage from 05/04/2021: Stage IIA (cT3, cN0, cM0, G2, ER+, PR+, HER2-) - Signed by Sophia Cruel, MD on 05/04/2021 Stage prefix: Initial diagnosis Histologic grading system: 3 grade system Laterality: Right Staged by: Pathologist and managing physician Stage used in treatment planning: Yes National guidelines used in treatment planning: Yes Type of national guideline used in treatment planning: Sophia Moore   SUMMARY OF ONCOLOGIC HISTORY: Oncology History  Malignant neoplasm of upper-outer quadrant of right breast in female, estrogen receptor positive ([place])  05/02/2021 Initial Diagnosis   Malignant neoplasm of upper-outer quadrant of right breast in female, estrogen receptor positive (Sophia Moore)   05/04/2021 Cancer Staging   Staging form: Breast, AJCC 8th Edition - Clinical stage from 05/04/2021: Stage IIA (cT3, cN0, cM0, G2, ER+, PR+, HER2-) - Signed by Sophia Cruel, MD on 05/04/2021 Stage prefix: Initial diagnosis Histologic grading system: 3 grade system Laterality: Right Staged by: Pathologist and managing physician Stage used in treatment planning: Yes National guidelines used in treatment planning: Yes Type of national guideline used in treatment planning: Sophia Moore   05/11/2021 Genetic Testing   Negative genetic testing:  No pathogenic variants detected on the [facility] BRCAplus panel (report date 05/11/2021) or CancerNext-Expanded + RNAinsight panel (report date 05/16/2021). A variant of uncertain significance (VUS) was detected in the [facility] gene called p.R1369H (c.4106G>A).  The BRCAplus panel offered by Sophia Moore and includes sequencing and deletion/duplication analysis  for the following 8 genes: ATM, BRCA1, BRCA2, CDH1, CHEK2, PALB2, PTEN, and TP53. The CancerNext-Expanded + RNAinsight gene panel offered by Sophia Moore and includes sequencing and rearrangement analysis for the following 77 genes: AIP, ALK, APC, ATM, AXIN2, BAP1, BARD1, BLM, BMPR1A, BRCA1, BRCA2, BRIP1, CDC73, CDH1, CDK4, CDKN1B, CDKN2A, CHEK2, CTNNA1, DICER1, FANCC, FH, FLCN, GALNT12, KIF1B, LZTR1, MAX, MEN1, MET, MLH1, MSH2, MSH3, MSH6, MUTYH, NBN, NF1, NF2, NTHL1, PALB2, PHOX2B, PMS2, POT1, PRKAR1A, PTCH1, PTEN, RAD51C, RAD51D, RB1, RECQL, RET, SDHA, SDHAF2, SDHB, SDHC, SDHD, SMAD4, SMARCA4, SMARCB1, SMARCE1, STK11, SUFU, TMEM127, TP53, TSC1, TSC2, VHL and XRCC2 (sequencing and deletion/duplication); EGFR, EGLN1, HOXB13, KIT, MITF, PDGFRA, POLD1 and POLE (sequencing only); EPCAM and GREM1 (deletion/duplication only). RNA data is routinely analyzed for use in variant interpretation for all genes.   06/01/2021 Surgery   status post right mastectomy and sentinel lymph node sampling 06/01/2021 for an mpT3 pN1-2, stage IIA invasive ductal carcinoma, grade 1, with negative margins             (A) a total of 2 right axillary lymph nodes were removed, both positive, 1 with extracapsular extension             (B) completion axillary dissection being considered   06/30/2021 - 09/02/2021 Chemotherapy   Patient is on Treatment Plan : BREAST TC q21d     09/26/2021 - 11/08/2021 Radiation Therapy   09/26/2021 through 11/08/2021 Site Technique Total Dose (Gy) Dose per Fx (Gy) Completed Fx Beam Energies  Chest Wall, Right: CW_R_IMN 3D 50/50 2 25/25 6X  Chest Wall, Right: CW_R_PAB_SCV 3D 50/50 2 25/25 6X, 10X  Chest Wall, Right: CW_R_Bst Electron 10/10 2 5/5 6E    11/2021 -  Anti-estrogen oral therapy   Sophia Moore daily     CURRENT THERAPY: Sophia Moore  INTERVAL HISTORY: Sophia Moore 41 y.o. female returns for f/u on Sophia Moore daily.  She is taking 32m daily.  She would rather take 158minstead  of 2061mue to improved QOL and decreased side effects.   Sophia Moore underwent a left breast screening mammogram on March 23, 2022 that showed a possible mass in the left breast.  She underwent a diagnostic left breast mammogram along with ultrasound on March 31, 2022 which showed that the Port-A-Cath scar and small seroma cavity accounted for this abnormal mammographic finding.  There was no mammographic or sonographic evidence for malignancy.  Sophia Moore any changes at her mastectomy site or in her left breast.  She is exercising, but not like she did previously.  She gained about 5-10 pounds and notes she is exercising at least 3 times per week.    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  MEDICAL HISTORY: Past Medical History:  Diagnosis Date   Depression    takes Citalopram daily   Family history of breast cancer    Personal history of chemotherapy    Personal history of radiation therapy    Thyroid nodule 07/2016    SURGICAL HISTORY: Past Surgical History:  Procedure Laterality Date   BREAST BIOPSY Right 04/2021   IR IMAGING GUIDED PORT INSERTION  06/27/2021   IR REMOVAL TUN ACCESS W/ PORT W/O FL MOD SED  11/29/2021   MASTECTOMY Right 05/2021   MASTECTOMY W/ SENTINEL NODE BIOPSY Right 06/01/2021   Procedure: RIGHT MASTECTOMY WITH AXILLARY SENTINEL LYMPH NODE BIOPSY;  Surgeon: Sophia Moore;  [place] [facility] [place];  Laterality: Right;   THYROIDECTOMY  09/13/2016   THYROIDECTOMY N/A 09/13/2016   Procedure: TOTAL  THYROIDECTOMY;  Surgeon: Su Leta BaptistD;  [facility];  Service: ENT;  Laterality: N/A;   wisdom teeth extracted      WISDOM TOOTH EXTRACTION      SOCIAL HISTORY: Social History   Socioeconomic History   Marital status: Married    Spouse name: Not on file   Number of children: Not on file   Years of education: Not on file   Highest education level: Not on file  Occupational History   Not on file  Tobacco Use   Smoking status: Never   Smokeless tobacco: Never  Vaping Use   Vaping Use: Never used  Substance and Sexual Activity   Alcohol use: No   Drug use: No   Sexual activity: Not Currently    Birth control/protection: None  Other Topics Concern   Not on file  Social History Narrative   Not on file   Social Determinants of Health   Financial Resource Strain: High Risk (07/20/2021)   Overall Financial Resource Strain (CARDIA)    Difficulty of Paying Living Expenses: Very hard  Food Insecurity: Not on file  Transportation Needs: No Transportation Needs (05/04/2021)   PRAPARE - Transportation    Lack of Transportation (Medical): No    Lack of Transportation (Non-Medical): No  Physical Activity: Not on file  Stress: Not on file  Social Connections: Not on  file  Intimate Partner Violence: Not on file    FAMILY HISTORY: Family History  Problem Relation Age of Onset   Hyperlipidemia Mother    Stroke Mother    Breast cancer Maternal Aunt        dx 22s    Review of Systems  Constitutional:  Negative for appetite change, chills, fatigue, fever and unexpected weight change.  HENT:   Negative for hearing loss, lump/mass and trouble swallowing.   Eyes:  Negative for eye problems and icterus.  Respiratory:  Negative for chest tightness, cough and shortness of breath.   Cardiovascular:  Negative for chest pain, leg swelling and palpitations.  Gastrointestinal:  Negative for abdominal distention, abdominal pain, constipation, diarrhea, nausea and vomiting.  Endocrine:  Positive for hot flashes.  Genitourinary:  Negative for difficulty urinating.   Musculoskeletal:  Negative for arthralgias.  Skin:  Negative for itching and rash.  Neurological:  Negative for dizziness, extremity weakness, headaches and numbness.  Hematological:  Negative for adenopathy. Does not bruise/bleed easily.  Psychiatric/Behavioral:  Negative for depression. The patient is not nervous/anxious.       PHYSICAL EXAMINATION  ECOG PERFORMANCE STATUS: 1 - Symptomatic but completely ambulatory  Vitals:   08/07/22 1048  BP: 114/67  Pulse: (!) 55  Resp: 18  Temp: 97.9 F (36.6 C)  SpO2: 100%    Physical Exam Constitutional:      General: She is not in acute distress.    Appearance: Normal appearance. She is not toxic-appearing.  HENT:     Head: Normocephalic and atraumatic.  Eyes:     General: No scleral icterus. Cardiovascular:     Rate and Rhythm: Normal rate and regular rhythm.     Pulses: Normal pulses.     Heart sounds: Normal heart sounds.  Pulmonary:     Effort: Pulmonary effort is normal.     Breath sounds: Normal breath sounds.  Chest:     Comments: Right breast status postmastectomy and radiation no sign of local recurrence left breast is benign. Abdominal:     General: Abdomen is flat. Bowel sounds are normal. There is no distension.     Palpations: Abdomen is soft.     Tenderness: There is no abdominal tenderness.  Musculoskeletal:        General: No swelling.     Cervical back: Neck supple.  Lymphadenopathy:     Cervical: No cervical adenopathy.  Skin:    General: Skin is warm and dry.     Findings: No rash.  Neurological:     General: No focal deficit present.     Mental Status: She is alert.  Psychiatric:        Mood and Affect: Mood normal.        Behavior: Behavior normal.     LABORATORY DATA: None for this visit   ASSESSMENT and THERAPY PLAN:   Malignant neoplasm of upper-outer quadrant of right breast in female, estrogen receptor  positive ([place]) Sophia Moore is a 41 year old woman with stage IIa ER/PR positive right-sided breast cancer diagnosed in July 2022 status post right mastectomy, adjuvant chemotherapy, adjuvant radiation therapy, and Sophia Moore that began in February 2023.  Sophia Moore has struggled with continuing on Sophia Moore at 20 mg however can tolerate and have good quality of life at the 10 mg dose.  She does experience intermittent hot flashes however is managing these without difficulty.  She also notes some mild weight gain which can happen when starting Sophia Moore but should level off.  I  encouraged her to continue exercising.  Sophia Moore has no clinical or radiographic sign of breast cancer recurrence.  She will continue to undergo left breast mammograms which are due annually in July.  We will see her back in 6 months for follow-up with Dr. Chryl Heck.  All questions were answered. The patient knows to call the clinic with any problems, questions or concerns. We can certainly see the patient much sooner if necessary.  Total encounter time:20 minutes*in face-to-face visit time, chart review, lab review, care coordination, order entry, and documentation of the encounter time.  Wilber Bihari, NP 08/07/22 11:26 AM Medical Oncology and Hematology [facility], [place] [address] Tel. [phone]    Fax. [phone]  *Total Encounter Time as defined by the Centers for Medicare and Medicaid Services includes, in addition to the face-to-face time of a patient visit (documented in the note above) non-face-to-face time: obtaining and reviewing outside history, ordering and reviewing medications, tests or procedures, care coordination (communications with other health care professionals or caregivers) and documentation in the medical record.

## 2022-08-07 NOTE — Assessment & Plan Note (Signed)
Sophia Moore is a 41 year old woman with stage IIa ER/PR positive right-sided breast cancer diagnosed in July 2022 status post right mastectomy, adjuvant chemotherapy, adjuvant radiation therapy, and tamoxifen that began in February 2023.  Sophia Moore has struggled with continuing on tamoxifen at 20 mg however can tolerate and have good quality of life at the 10 mg dose.  She does experience intermittent hot flashes however is managing these without difficulty.  She also notes some mild weight gain which can happen when starting tamoxifen but should level off.  I encouraged her to continue exercising.  Sophia Moore has no clinical or radiographic sign of breast cancer recurrence.  She will continue to undergo left breast mammograms which are due annually in July.  We will see her back in 6 months for follow-up with Dr. Chryl Heck.

## 2022-08-09 ENCOUNTER — Telehealth: Payer: Self-pay | Admitting: Adult Health

## 2022-08-09 NOTE — Telephone Encounter (Signed)
Scheduled appointment per 10/30 los. Patient is aware.

## 2022-08-30 ENCOUNTER — Telehealth: Payer: Self-pay | Admitting: *Deleted

## 2022-09-01 NOTE — Telephone Encounter (Signed)
No entry 

## 2022-09-14 LAB — T4, FREE: Free T4: 1.18 ng/dL (ref 0.82–1.77)

## 2022-09-14 LAB — TSH: TSH: 5.6 u[IU]/mL — ABNORMAL HIGH (ref 0.450–4.500)

## 2022-09-19 ENCOUNTER — Ambulatory Visit (INDEPENDENT_AMBULATORY_CARE_PROVIDER_SITE_OTHER): Payer: BC Managed Care – PPO | Admitting: "Endocrinology

## 2022-09-19 ENCOUNTER — Encounter: Payer: Self-pay | Admitting: "Endocrinology

## 2022-09-19 VITALS — BP 92/66 | HR 72 | Ht 61.0 in | Wt 125.8 lb

## 2022-09-19 DIAGNOSIS — E890 Postprocedural hypothyroidism: Secondary | ICD-10-CM | POA: Diagnosis not present

## 2022-09-19 MED ORDER — LEVOTHYROXINE SODIUM 100 MCG PO TABS: 100.0000 ug | ORAL_TABLET | Freq: Every day | ORAL | 1 refills | Status: DC

## 2022-09-19 NOTE — Progress Notes (Signed)
09/19/2022  Endocrinology follow-up note   Subjective:    Patient ID: Sophia Moore, female    DOB: [birth year], PCP [place] [place] [facility]   Past Medical History:  Diagnosis Date   Depression    takes Citalopram daily   Family history of breast cancer    Personal history of chemotherapy    Personal history of radiation therapy    Thyroid nodule 07/2016   Past Surgical History:  Procedure Laterality Date   BREAST BIOPSY Right 04/2021   IR IMAGING GUIDED PORT INSERTION  06/27/2021   IR REMOVAL TUN ACCESS W/ PORT W/O FL MOD SED  11/29/2021   MASTECTOMY Right 05/2021   MASTECTOMY W/ SENTINEL NODE BIOPSY Right 06/01/2021   Procedure: RIGHT MASTECTOMY WITH AXILLARY SENTINEL LYMPH NODE BIOPSY;  Surgeon: Rolm Bookbinder, MD;  Location: [facility];  Service: General;  Laterality: Right;   THYROIDECTOMY  09/13/2016   THYROIDECTOMY N/A 09/13/2016   Procedure: TOTAL THYROIDECTOMY;  Surgeon: Leta Baptist, MD;  [facility];  Service: ENT;  Laterality: N/A;   wisdom teeth extracted      WISDOM TOOTH EXTRACTION     Social History   Socioeconomic History   Marital status: Married    Spouse name: Not on file   Number of children: Not on file   Years of education: Not on file   Highest education level: Not on file  Occupational History   Not on file  Tobacco Use   Smoking status: Never   Smokeless tobacco: Never  Vaping Use   Vaping Use: Never used  Substance and Sexual Activity   Alcohol use: No   Drug use: No   Sexual activity: Not Currently    Birth control/protection: None  Other Topics Concern   Not on file  Social History Narrative   Not on file   Social Determinants of Health   Financial Resource Strain: High Risk (07/20/2021)   Overall Financial Resource Strain (CARDIA)    Difficulty of Paying Living Expenses: Very hard  Food Insecurity: Not on file  Transportation Needs: No Transportation Needs (05/04/2021)    PRAPARE - Transportation    Lack of Transportation (Medical): No    Lack of Transportation (Non-Medical): No  Physical Activity: Not on file  Stress: Not on file  Social Connections: Not on file   Outpatient Encounter Medications as of 09/19/2022  Medication Sig   doxycycline (VIBRA-TABS) 100 MG tablet Take by mouth.   cholecalciferol (VITAMIN D3) 25 MCG (1000 UNIT) tablet Take 1,000 Units by mouth daily.   citalopram (CELEXA) 20 MG tablet Take 20 mg by mouth daily.   levothyroxine (SYNTHROID) 100 MCG tablet Take 1 tablet (100 mcg total) by mouth daily before breakfast.   olopatadine (PATADAY) 0.1 % ophthalmic solution Place 1 drop into both eyes 2 (two) times daily.   tamoxifen (NOLVADEX) 20 MG tablet TAKE 1 TABLET BY MOUTH EVERY DAY   [DISCONTINUED] levothyroxine (SYNTHROID) 88 MCG tablet Take 1 tablet (88 mcg total) by mouth daily.   No facility-administered encounter medications on file as of 09/19/2022.   ALLERGIES: Allergies  Allergen Reactions   Amoxicillin Rash   Penicillins Rash    Has patient had a PCN reaction causing immediate rash, facial/tongue/throat swelling, SOB or lightheadedness with hypotension: Yes Has patient had a PCN reaction causing severe rash involving mucus membranes or skin necrosis: No Has patient had a PCN reaction that required hospitalization No Has patient had a PCN reaction occurring within the last  10 years: Yes If all of the above answers are "NO", then may proceed with Cephalosporin use.    VACCINATION STATUS: Immunization History  Administered Date(s) Administered   PPD Test 04/24/2016   Tdap 07/02/2020    HPI 41 year old female patient with medical history as above. 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 - Her surgical pathology was negative for malignancy.  She is currently on thyroid hormone replacement, levothyroxine 88 mcg p.o. daily before  breakfast.   Her previsit thyroid function tests are consistent with under replacement.    -Prior to her last visit, she was diagnosed with breast cancer s/p surgery and chemotherapy.  She reports symptoms of fatigue, and few pounds of weight gain. She denies palpitations, tremors, nor heat intolerance.  - She denies family history of thyroid dysfunction. She denies exposure to neck radiation. She denies heat/cold intolerance. She has steady body weight. Denies dysphagia, shortness of breath, nor voice change.  Review of Systems  Constitutional:  + Slightly fluctuating heart rate,  no fatigue, no subjective hyperthermia/hypothermia   Objective:    BP 92/66   Pulse 72   Ht '5\' 1"'$  (1.549 m)   Wt 125 lb 12.8 oz (57.1 kg)   BMI 23.77 kg/m   Wt Readings from Last 3 Encounters:  09/19/22 125 lb 12.8 oz (57.1 kg)  08/07/22 123 lb 3.2 oz (55.9 kg)  04/21/22 121 lb 14.4 oz (55.3 kg)      Physical Exam- Limited  Constitutional:  Body mass index is 23.77 kg/m. , not in acute distress, normal state of mind       09/13/2016 surgical pathology of total thyroidectomy-no malignancy. Thyroid, thyroidectomy, Right - FOLLICULAR ADENOMA WITH CALCIFICATIONS, 2.1 CM. - MICROSCOPIC INTRATHYROIDAL PARATHYROID TISSUE. - THERE IS NO EVIDENCE OF MALIGNANCY.  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  January 29, 2018 thyroid/neck ultrasound: Thyroid surgically absent with no evidence of remnant or recurrence.  Recent Results (from the past 2160 hour(s))  TSH     Status: Abnormal   Collection Time: 09/13/22  1:56 PM  Result Value Ref Range   TSH 5.600 (H) 0.450 - 4.500 uIU/mL  T4, free     Status: None   Collection Time: 09/13/22  1:56 PM  Result Value Ref Range   Free T4 1.18 0.82 - 1.77 ng/dL     Assessment & Plan:  1. Postsurgical  hypothyroidism 2. Follicular neoplasm of thyroid -  She is post total thyroidectomy for abnormal FNA which resulted in follicular neoplasm.    Her surgical sample diagnosis showed no malignancy.    - She did not require radioactive iodine ablative therapy.   -Regarding her postsurgical hypothyroidism: -Her thyroid function tests are consistent with under replacement.  I discussed and increase her levothyroxine to 100 mcg p.o. daily before breakfast.     - We discussed about the correct intake of her thyroid hormone, on empty stomach at fasting, with water, separated by at least 30 minutes from breakfast and other medications,  and separated by more than 4 hours from calcium, iron, multivitamins, acid reflux medications (PPIs). -Patient is made aware of the fact that thyroid hormone replacement is needed for life, dose to be adjusted by periodic monitoring of thyroid function tests.   She is encouraged to maintain close follow-up with her oncology service.  -  I advised patient to maintain close follow up with [facility] [place] for primary care needs.  I spent 21 minutes in the care of the patient today including review of labs from Thyroid Function, CMP, and other relevant labs ; imaging/biopsy records (current and previous including abstractions from other facilities); face-to-face time discussing  her lab results and symptoms, medications doses, her options of short and long term treatment based on the latest standards of care / guidelines;   and documenting the encounter.  Marella Bile Turrubiartes  participated in the discussions, expressed understanding, and voiced agreement with the above plans.  All questions were answered to her satisfaction. she is encouraged to contact clinic should she have any questions or concerns prior to her return visit.    Follow up plan: Return in about 6 months (around 03/21/2023) for Fasting Labs  in AM B4 8.  Glade Lloyd,  MD Phone: [phone]  Fax: [phone]  -  This note was partially dictated with voice recognition software. Similar sounding words can be transcribed inadequately or may not  be corrected upon review.  09/19/2022, 1:06 PM

## 2022-12-01 ENCOUNTER — Other Ambulatory Visit: Payer: Self-pay | Admitting: *Deleted

## 2022-12-01 MED ORDER — TAMOXIFEN CITRATE 20 MG PO TABS: 20.0000 mg | ORAL_TABLET | Freq: Every day | ORAL | 1 refills | Status: AC

## 2023-02-06 ENCOUNTER — Inpatient Hospital Stay: Payer: BC Managed Care – PPO | Attending: Hematology and Oncology | Admitting: Hematology and Oncology

## 2023-02-06 ENCOUNTER — Other Ambulatory Visit: Payer: Self-pay

## 2023-02-06 VITALS — BP 112/60 | HR 64 | Temp 97.7°F | Resp 13 | Wt 123.3 lb

## 2023-02-06 DIAGNOSIS — C50411 Malignant neoplasm of upper-outer quadrant of right female breast: Secondary | ICD-10-CM | POA: Diagnosis not present

## 2023-02-06 DIAGNOSIS — R42 Dizziness and giddiness: Secondary | ICD-10-CM | POA: Diagnosis not present

## 2023-02-06 DIAGNOSIS — Z880 Allergy status to penicillin: Secondary | ICD-10-CM | POA: Diagnosis not present

## 2023-02-06 DIAGNOSIS — Z79810 Long term (current) use of selective estrogen receptor modulators (SERMs): Secondary | ICD-10-CM | POA: Insufficient documentation

## 2023-02-06 DIAGNOSIS — Z9221 Personal history of antineoplastic chemotherapy: Secondary | ICD-10-CM | POA: Insufficient documentation

## 2023-02-06 DIAGNOSIS — Z5986 Financial insecurity: Secondary | ICD-10-CM | POA: Diagnosis not present

## 2023-02-06 DIAGNOSIS — Z8349 Family history of other endocrine, nutritional and metabolic diseases: Secondary | ICD-10-CM | POA: Insufficient documentation

## 2023-02-06 DIAGNOSIS — Z923 Personal history of irradiation: Secondary | ICD-10-CM | POA: Insufficient documentation

## 2023-02-06 DIAGNOSIS — Z823 Family history of stroke: Secondary | ICD-10-CM | POA: Insufficient documentation

## 2023-02-06 DIAGNOSIS — R5383 Other fatigue: Secondary | ICD-10-CM | POA: Diagnosis not present

## 2023-02-06 DIAGNOSIS — Z79899 Other long term (current) drug therapy: Secondary | ICD-10-CM | POA: Insufficient documentation

## 2023-02-06 DIAGNOSIS — F419 Anxiety disorder, unspecified: Secondary | ICD-10-CM | POA: Diagnosis not present

## 2023-02-06 DIAGNOSIS — Z170 Estrogen receptor positive status [ER+]: Secondary | ICD-10-CM

## 2023-02-06 DIAGNOSIS — Z803 Family history of malignant neoplasm of breast: Secondary | ICD-10-CM | POA: Insufficient documentation

## 2023-02-06 NOTE — Progress Notes (Signed)
[facility] up:    [place], [place] At [address] [place] [address]   DIAGNOSIS:  Cancer Staging  Malignant neoplasm of upper-outer quadrant of right breast in female, estrogen receptor positive ([place]) Staging form: Breast, AJCC 8th Edition - Clinical stage from 05/04/2021: Stage IIA (cT3, cN0, cM0, G2, ER+, PR+, HER2-) - Signed by Chauncey Cruel, MD on 05/04/2021 Stage prefix: Initial diagnosis Histologic grading system: 3 grade system Laterality: Right Staged by: Pathologist and managing physician Stage used in treatment planning: Yes National guidelines used in treatment planning: Yes Type of national guideline used in treatment planning: NCCN   SUMMARY OF ONCOLOGIC HISTORY: Oncology History  Malignant neoplasm of upper-outer quadrant of right breast in female, estrogen receptor positive ([place])  05/02/2021 Initial Diagnosis   Malignant neoplasm of upper-outer quadrant of right breast in female, estrogen receptor positive ([place])   05/04/2021 Cancer Staging   Staging form: Breast, AJCC 8th Edition - Clinical stage from 05/04/2021: Stage IIA (cT3, cN0, cM0, G2, ER+, PR+, HER2-) - Signed by Chauncey Cruel, MD on 05/04/2021 Stage prefix: Initial diagnosis Histologic grading system: 3 grade system Laterality: Right Staged by: Pathologist and managing physician Stage used in treatment planning: Yes National guidelines used in treatment planning: Yes Type of national guideline used in treatment planning: NCCN   05/11/2021 Genetic Testing   Negative genetic testing:  No pathogenic variants detected on the [employer] BRCAplus panel (report date 05/11/2021) or CancerNext-Expanded + RNAinsight panel (report date 05/16/2021). A variant of uncertain significance (VUS) was detected in the [facility] gene called p.R1369H (c.4106G>A).  The BRCAplus panel offered by [employer] and includes sequencing and deletion/duplication analysis  for the following 8 genes: ATM, BRCA1, BRCA2, CDH1, CHEK2, PALB2, PTEN, and TP53. The CancerNext-Expanded + RNAinsight gene panel offered by [employer] and includes sequencing and rearrangement analysis for the following 77 genes: AIP, ALK, APC, ATM, AXIN2, BAP1, BARD1, BLM, BMPR1A, BRCA1, BRCA2, BRIP1, CDC73, CDH1, CDK4, CDKN1B, CDKN2A, CHEK2, CTNNA1, DICER1, FANCC, FH, FLCN, GALNT12, KIF1B, LZTR1, MAX, MEN1, MET, MLH1, MSH2, MSH3, MSH6, MUTYH, NBN, NF1, NF2, NTHL1, PALB2, PHOX2B, PMS2, POT1, PRKAR1A, PTCH1, PTEN, RAD51C, RAD51D, RB1, RECQL, RET, SDHA, SDHAF2, SDHB, SDHC, SDHD, SMAD4, SMARCA4, SMARCB1, SMARCE1, STK11, SUFU, TMEM127, TP53, TSC1, TSC2, VHL and XRCC2 (sequencing and deletion/duplication); EGFR, EGLN1, HOXB13, KIT, MITF, PDGFRA, POLD1 and POLE (sequencing only); EPCAM and GREM1 (deletion/duplication only). RNA data is routinely analyzed for use in variant interpretation for all genes.   06/01/2021 Surgery   status post right mastectomy and sentinel lymph node sampling 06/01/2021 for an mpT3 pN1-2, stage IIA invasive ductal carcinoma, grade 1, with negative margins             (A) a total of 2 right axillary lymph nodes were removed, both positive, 1 with extracapsular extension             (B) completion axillary dissection being considered   06/30/2021 - 09/02/2021 Chemotherapy   Patient is on Treatment Plan : BREAST TC q21d     09/26/2021 - 11/08/2021 Radiation Therapy   09/26/2021 through 11/08/2021 Site Technique Total Dose (Gy) Dose per Fx (Gy) Completed Fx Beam Energies  Chest Wall, Right: CW_R_IMN 3D 50/50 2 25/25 6X  Chest Wall, Right: CW_R_PAB_SCV 3D 50/50 2 25/25 6X, 10X  Chest Wall, Right: CW_R_Bst Electron 10/10 2 5/5 6E    11/2021 -  Anti-estrogen oral therapy   Tamoxifen daily     CURRENT THERAPY: Tamoxifen  INTERVAL HISTORY:  Sophia Moore 42 y.o. female returns for f/u on Tamoxifen daily.  She is taking 10mg  daily.  Since last visit, she has been  having some fatigue, dizziness at night, anxiety while driving. Last mammogram and Ultrasound in June showed port a cath scar and associated small seroma cavity account for mammographic finding.  She is otherwise exercising daily, due for a mammogram in June. No other concerns.  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  MEDICAL HISTORY: Past Medical History:  Diagnosis Date   Depression    takes Citalopram daily   Family history of breast cancer    Personal history of chemotherapy    Personal history of radiation therapy    Thyroid nodule 07/2016    SURGICAL HISTORY: Past Surgical History:  Procedure Laterality Date   BREAST BIOPSY Right 04/2021   IR IMAGING GUIDED PORT INSERTION  06/27/2021   IR REMOVAL TUN ACCESS W/ PORT W/O FL MOD SED  11/29/2021   MASTECTOMY Right 05/2021   MASTECTOMY W/ SENTINEL NODE BIOPSY Right 06/01/2021   Procedure: RIGHT MASTECTOMY WITH AXILLARY SENTINEL LYMPH NODE BIOPSY;  Surgeon: Emelia Loron, MD;  Location: [facility];  Service: General;  Laterality: Right;   THYROIDECTOMY  09/13/2016   THYROIDECTOMY N/A 09/13/2016   Procedure: TOTAL THYROIDECTOMY;  Surgeon: Newman Pies, MD;  Location: [facility];  Service: ENT;  Laterality: N/A;   wisdom teeth extracted      WISDOM TOOTH EXTRACTION      SOCIAL HISTORY: Social History   Socioeconomic History   Marital status: Married    Spouse name: Not on file   Number of children: Not on file   Years of education: Not on file   Highest education level: Not on file   Occupational History   Not on file  Tobacco Use   Smoking status: Never   Smokeless tobacco: Never  Vaping Use   Vaping Use: Never used  Substance and Sexual Activity   Alcohol use: No   Drug use: No   Sexual activity: Not Currently    Birth control/protection: None  Other Topics Concern   Not on file  Social History Narrative   Not on file   Social Determinants of Health   Financial Resource Strain: High Risk (07/20/2021)   Overall Financial Resource Strain (CARDIA)    Difficulty of Paying Living Expenses: Very hard  Food Insecurity: Not on file  Transportation Needs: No Transportation Needs (05/04/2021)   PRAPARE - Transportation    Lack of Transportation (Medical): No    Lack of Transportation (Non-Medical): No  Physical Activity: Not on file  Stress: Not on file  Social Connections: Not on file  Intimate Partner Violence: Not on file    FAMILY HISTORY: Family History  Problem Relation Age of Onset   Hyperlipidemia Mother    Stroke Mother    Breast cancer Maternal Aunt        dx 48s        PHYSICAL EXAMINATION  ECOG PERFORMANCE STATUS: 1 - Symptomatic but completely ambulatory  Vitals:   02/06/23 1610  BP: 112/60  Pulse: 64  Resp: 13  Temp: 97.7 F (36.5 C)  SpO2: 99%    Physical Exam Constitutional:      General: She is not in acute distress.    Appearance: Normal appearance. She is not toxic-appearing.  HENT:     Head: Normocephalic and atraumatic.  Eyes:     General: No scleral icterus. Cardiovascular:     Rate and Rhythm: Normal rate and regular rhythm.     Pulses: Normal pulses.     Heart sounds: Normal heart sounds.  Pulmonary:     Effort: Pulmonary effort is normal.     Breath sounds: Normal breath sounds.  Chest:     Comments: Right breast status postmastectomy and radiation no sign of local recurrence left breast is benign. Abdominal:     General: Abdomen is flat. Bowel sounds are normal. There is no distension.      Palpations: Abdomen is soft.     Tenderness: There is no abdominal tenderness.  Musculoskeletal:        General: No swelling.     Cervical back: Neck supple.  Lymphadenopathy:     Cervical: No cervical adenopathy.  Skin:    General: Skin is warm and dry.     Findings: No rash.  Neurological:     General: No focal deficit present.     Mental Status: She is alert.  Psychiatric:        Mood and Affect: Mood normal.        Behavior: Behavior normal.     LABORATORY DATA: None for this visit   ASSESSMENT and THERAPY PLAN:   ASSESSMENT: 42 y.o. Spanish speaker status post right breast upper outer quadrant biopsy 04/26/2021 for a clinical mT3 N0, stage IIA invasive ductal carcinoma, grade 1 or 2, estrogen and progesterone receptor positive, HER2 not amplified, with an MIB-1 of 5%.   (1) genetics testing 05/16/2021 through the [employer] panel (report date 05/11/2021) or CancerNext-Expanded + RNAinsight panel (report date 05/16/2021).   The BRCAplus panel offered by [employer] and includes sequencing and deletion/duplication analysis for the following 8 genes: ATM, BRCA1, BRCA2, CDH1, CHEK2, PALB2, PTEN, and TP53. The CancerNext-Expanded + RNAinsight gene panel offered by [employer] and includes sequencing and rearrangement analysis for the following 77 genes: AIP, ALK, APC, ATM, AXIN2, BAP1, BARD1, BLM, BMPR1A, BRCA1, BRCA2, BRIP1, CDC73, CDH1, CDK4, CDKN1B, CDKN2A, CHEK2, CTNNA1, DICER1, FANCC, FH, FLCN, GALNT12, KIF1B, LZTR1, MAX, MEN1, MET, MLH1, MSH2, MSH3, MSH6, MUTYH, NBN, NF1, NF2, NTHL1, PALB2, PHOX2B, PMS2, POT1, PRKAR1A, PTCH1, PTEN, RAD51C, RAD51D, RB1, RECQL, RET, SDHA, SDHAF2, SDHB, SDHC, SDHD, SMAD4, SMARCA4, SMARCB1, SMARCE1, STK11, SUFU, TMEM127, TP53, TSC1, TSC2, VHL and XRCC2 (sequencing and deletion/duplication); EGFR, EGLN1, HOXB13, KIT, MITF, PDGFRA, POLD1 and POLE (sequencing only); EPCAM and GREM1 (deletion/duplication only). RNA data is routinely analyzed for use  in variant interpretation for all genes.             (A) A variant of uncertain significance (VUS) was detected in the [facility] gene called p.R1369H (c.4106G>A).   (2) MammaPrint obtained from the 04/26/2021 biopsy returned luminal a type, low risk, with a predicted benefit from additional chemotherapy in the 1-2% range [given the patient's young age however, the benefit of chemotherapy might be as high as 5%].   (3) status post right mastectomy and sentinel lymph node sampling 06/01/2021 for an mpT3 pN1-2, stage IIA invasive ductal carcinoma, grade 1, with negative margins             (  A) a total of 2 right axillary lymph nodes were removed, both positive, 1 with extracapsular extension             (B) completion axillary dissection being considered   (3) adjuvant chemotherapy with docetaxel, cyclophosphamide given on day 1 of a 21 day cycle x 4 from 06/30/2021 through 09/01/2021   (4) adjuvant radiation completed 11/08/2021   (5) tamoxifen started 05/04/2021 in anticipation of surgical delays; held during chemotherapy.   #6 she completed adjuvant chemotherapy on 08/31/2021  PLAN She is now on adjuvant tamoxifen. She has recently started noticing dizziness, headaches and some anxiety especially when she is driving on the highways. Given her locally advanced breast cancer at diagnosis, we will consider MRI brain. If this is unremarkable, she may try something for anxiety and see if this alleviates the symptoms. RTC in 6 months.  Total encounter time:30 minutes*in face-to-face visit time, chart review, lab review, care coordination, order entry, and documentation of the encounter time. Tel. [phone]    Fax. [phone]  *Total Encounter Time as defined by the Centers for Medicare and Medicaid Services includes, in addition to the face-to-face time of a patient visit (documented in the note above) non-face-to-face time: obtaining and reviewing outside history, ordering and reviewing  medications, tests or procedures, care coordination (communications with other health care professionals or caregivers) and documentation in the medical record.

## 2023-02-07 ENCOUNTER — Telehealth: Payer: Self-pay | Admitting: Hematology and Oncology

## 2023-02-07 NOTE — Telephone Encounter (Signed)
Left patient a vm regarding upcoming appointments  

## 2023-02-21 ENCOUNTER — Ambulatory Visit (HOSPITAL_COMMUNITY): Payer: BC Managed Care – PPO

## 2023-02-28 ENCOUNTER — Other Ambulatory Visit: Payer: Self-pay | Admitting: "Endocrinology

## 2023-03-02 ENCOUNTER — Telehealth: Payer: Self-pay | Admitting: *Deleted

## 2023-03-02 ENCOUNTER — Inpatient Hospital Stay: Payer: BC Managed Care – PPO | Attending: Hematology and Oncology | Admitting: Hematology and Oncology

## 2023-03-02 DIAGNOSIS — C50411 Malignant neoplasm of upper-outer quadrant of right female breast: Secondary | ICD-10-CM

## 2023-03-02 DIAGNOSIS — Z170 Estrogen receptor positive status [ER+]: Secondary | ICD-10-CM

## 2023-03-02 NOTE — Telephone Encounter (Signed)
This RN attempted to contact pt - to inform her of need to reschedule phone visit due to noted MRI not performed .  Per MRI- noted as scheduled for 5/15 and then cancelled by pt.  VM left per [facility] interpreters for pt to contact this RN to discuss above.

## 2023-03-02 NOTE — Progress Notes (Unsigned)
[facility] Follow up:    [facility] [address] [place] [address]   DIAGNOSIS:  Cancer Staging  Malignant neoplasm of upper-outer quadrant of right breast in female, estrogen receptor positive ([facility]) Staging form: Breast, AJCC 8th Edition - Clinical stage from 05/04/2021: Stage IIA (cT3, cN0, cM0, G2, ER+, PR+, HER2-) - Signed by Lowella Dell, MD on 05/04/2021 Stage prefix: Initial diagnosis Histologic grading system: 3 grade system Laterality: Right Staged by: Pathologist and managing physician Stage used in treatment planning: Yes National guidelines used in treatment planning: Yes Type of national guideline used in treatment planning: NCCN   SUMMARY OF ONCOLOGIC HISTORY: Oncology History  Malignant neoplasm of upper-outer quadrant of right breast in female, estrogen receptor positive ([facility])  05/02/2021 Initial Diagnosis   Malignant neoplasm of upper-outer quadrant of right breast in female, estrogen receptor positive ([facility])   05/04/2021 Cancer Staging   Staging form: Breast, AJCC 8th Edition - Clinical stage from 05/04/2021: Stage IIA (cT3, cN0, cM0, G2, ER+, PR+, HER2-) - Signed by Lowella Dell, MD on 05/04/2021 Stage prefix: Initial diagnosis Histologic grading system: 3 grade system Laterality: Right Staged by: Pathologist and managing physician Stage used in treatment planning: Yes National guidelines used in treatment planning: Yes Type of national guideline used in treatment planning: NCCN   05/11/2021 Genetic Testing   Negative genetic testing:  No pathogenic variants detected on the [employer] BRCAplus panel (report date 05/11/2021) or CancerNext-Expanded + RNAinsight panel (report date 05/16/2021). A variant of uncertain significance (VUS) was detected in the [facility] gene called p.R1369H (c.4106G>A).  The BRCAplus panel offered by [employer] and includes sequencing and deletion/duplication analysis  for the following 8 genes: ATM, BRCA1, BRCA2, CDH1, CHEK2, PALB2, PTEN, and TP53. The CancerNext-Expanded + RNAinsight gene panel offered by [employer] and includes sequencing and rearrangement analysis for the following 77 genes: AIP, ALK, APC, ATM, AXIN2, BAP1, BARD1, BLM, BMPR1A, BRCA1, BRCA2, BRIP1, CDC73, CDH1, CDK4, CDKN1B, CDKN2A, CHEK2, CTNNA1, DICER1, FANCC, FH, FLCN, GALNT12, KIF1B, LZTR1, MAX, MEN1, MET, MLH1, MSH2, MSH3, MSH6, MUTYH, NBN, NF1, NF2, NTHL1, PALB2, PHOX2B, PMS2, POT1, PRKAR1A, PTCH1, PTEN, RAD51C, RAD51D, RB1, RECQL, RET, SDHA, SDHAF2, SDHB, SDHC, SDHD, SMAD4, SMARCA4, SMARCB1, SMARCE1, STK11, SUFU, TMEM127, TP53, TSC1, TSC2, VHL and XRCC2 (sequencing and deletion/duplication); EGFR, EGLN1, HOXB13, KIT, MITF, PDGFRA, POLD1 and POLE (sequencing only); EPCAM and GREM1 (deletion/duplication only). RNA data is routinely analyzed for use in variant interpretation for all genes.   06/01/2021 Surgery   status post right mastectomy and sentinel lymph node sampling 06/01/2021 for an mpT3 pN1-2, stage IIA invasive ductal carcinoma, grade 1, with negative margins             (A) a total of 2 right axillary lymph nodes were removed, both positive, 1 with extracapsular extension             (B) completion axillary dissection being considered   06/30/2021 - 09/02/2021 Chemotherapy   Patient is on Treatment Plan : BREAST TC q21d     09/26/2021 - 11/08/2021 Radiation Therapy   09/26/2021 through 11/08/2021 Site Technique Total Dose (Gy) Dose per Fx (Gy) Completed Fx Beam Energies  Chest Wall, Right: CW_R_IMN 3D 50/50 2 25/25 6X  Chest Wall, Right: CW_R_PAB_SCV 3D 50/50 2 25/25 6X, 10X  Chest Wall, Right: CW_R_Bst Electron 10/10 2 5/5 6E    11/2021 -  Anti-estrogen oral therapy   Tamoxifen daily     CURRENT THERAPY: Tamoxifen  INTERVAL HISTORY:  Sophia Moore 42 y.o. female returns for f/u on Tamoxifen daily.  She is taking 10mg  daily.    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  MEDICAL HISTORY: Past Medical History:  Diagnosis Date   Depression    takes Citalopram daily   Family history of breast cancer    Personal history of chemotherapy    Personal history of radiation therapy    Thyroid nodule 07/2016    SURGICAL HISTORY: Past Surgical History:  Procedure Laterality Date   BREAST BIOPSY Right 04/2021   IR IMAGING GUIDED PORT INSERTION  06/27/2021   IR REMOVAL TUN ACCESS W/ PORT W/O FL MOD SED  11/29/2021   MASTECTOMY Right 05/2021   MASTECTOMY W/ SENTINEL NODE BIOPSY Right 06/01/2021   Procedure: RIGHT MASTECTOMY WITH AXILLARY SENTINEL LYMPH NODE BIOPSY;  Surgeon: Emelia Loron, MD;  Location: [facility];  Service: General;  Laterality: Right;   THYROIDECTOMY  09/13/2016   THYROIDECTOMY N/A 09/13/2016   Procedure: TOTAL THYROIDECTOMY;  Surgeon: Newman Pies, MD;  Location: [facility];  Service: ENT;  Laterality: N/A;   wisdom teeth extracted      WISDOM TOOTH EXTRACTION      SOCIAL HISTORY: Social History   Socioeconomic History   Marital status: Married    Spouse name: Not on file   Number of children: Not on file   Years of education: Not on file   Highest education level: Not on file  Occupational History   Not on file  Tobacco Use   Smoking status: Never   Smokeless tobacco: Never  Vaping Use   Vaping Use: Never used  Substance and Sexual Activity   Alcohol use: No   Drug use: No   Sexual activity: Not Currently    Birth control/protection: None  Other Topics Concern   Not on file   Social History Narrative   Not on file   Social Determinants of Health   Financial Resource Strain: High Risk (07/20/2021)   Overall Financial Resource Strain (CARDIA)    Difficulty of Paying Living Expenses: Very hard  Food Insecurity: Not on file  Transportation Needs: No Transportation Needs (05/04/2021)   PRAPARE - Transportation    Lack of Transportation (Medical): No    Lack of Transportation (Non-Medical): No  Physical Activity: Not on file  Stress: Not on file  Social Connections: Not on file  Intimate Partner Violence: Not on file    FAMILY HISTORY: Family History  Problem Relation Age of Onset   Hyperlipidemia Mother    Stroke Mother    Breast cancer Maternal Aunt        dx 46s        PHYSICAL EXAMINATION  ECOG PERFORMANCE STATUS: 1 - Symptomatic but completely ambulatory  There were no vitals filed for this visit.   Physical Exam Constitutional:      General: She is not in acute distress.    Appearance: Normal appearance. She is not toxic-appearing.  HENT:     Head: Normocephalic and atraumatic.  Eyes:     General: No scleral  icterus. Cardiovascular:     Rate and Rhythm: Normal rate and regular rhythm.     Pulses: Normal pulses.     Heart sounds: Normal heart sounds.  Pulmonary:     Effort: Pulmonary effort is normal.     Breath sounds: Normal breath sounds.  Chest:     Comments: Right breast status postmastectomy and radiation no sign of local recurrence left breast is benign. Abdominal:     General: Abdomen is flat. Bowel sounds are normal. There is no distension.     Palpations: Abdomen is soft.     Tenderness: There is no abdominal tenderness.  Musculoskeletal:        General: No swelling.     Cervical back: Neck supple.  Lymphadenopathy:     Cervical: No cervical adenopathy.  Skin:    General: Skin is warm and dry.     Findings: No rash.  Neurological:     General: No focal deficit present.     Mental Status: She is alert.   Psychiatric:        Mood and Affect: Mood normal.        Behavior: Behavior normal.     LABORATORY DATA: None for this visit   ASSESSMENT and THERAPY PLAN:   ASSESSMENT: 42 y.o. Spanish speaker status post right breast upper outer quadrant biopsy 04/26/2021 for a clinical mT3 N0, stage IIA invasive ductal carcinoma, grade 1 or 2, estrogen and progesterone receptor positive, HER2 not amplified, with an MIB-1 of 5%.   (1) genetics testing 05/16/2021 through the [employer] panel (report date 05/11/2021) or CancerNext-Expanded + RNAinsight panel (report date 05/16/2021).   The BRCAplus panel offered by [employer] and includes sequencing and deletion/duplication analysis for the following 8 genes: ATM, BRCA1, BRCA2, CDH1, CHEK2, PALB2, PTEN, and TP53. The CancerNext-Expanded + RNAinsight gene panel offered by [employer] and includes sequencing and rearrangement analysis for the following 77 genes: AIP, ALK, APC, ATM, AXIN2, BAP1, BARD1, BLM, BMPR1A, BRCA1, BRCA2, BRIP1, CDC73, CDH1, CDK4, CDKN1B, CDKN2A, CHEK2, CTNNA1, DICER1, FANCC, FH, FLCN, GALNT12, KIF1B, LZTR1, MAX, MEN1, MET, MLH1, MSH2, MSH3, MSH6, MUTYH, NBN, NF1, NF2, NTHL1, PALB2, PHOX2B, PMS2, POT1, PRKAR1A, PTCH1, PTEN, RAD51C, RAD51D, RB1, RECQL, RET, SDHA, SDHAF2, SDHB, SDHC, SDHD, SMAD4, SMARCA4, SMARCB1, SMARCE1, STK11, SUFU, TMEM127, TP53, TSC1, TSC2, VHL and XRCC2 (sequencing and deletion/duplication); EGFR, EGLN1, HOXB13, KIT, MITF, PDGFRA, POLD1 and POLE (sequencing only); EPCAM and GREM1 (deletion/duplication only). RNA data is routinely analyzed for use in variant interpretation for all genes.             (A) A variant of uncertain significance (VUS) was detected in the [facility] gene called p.R1369H (c.4106G>A).   (2) MammaPrint obtained from the 04/26/2021 biopsy returned luminal a type, low risk, with a predicted benefit from additional chemotherapy in the 1-2% range [given the patient's young age however, the benefit of  chemotherapy might be as high as 5%].   (3) status post right mastectomy and sentinel lymph node sampling 06/01/2021 for an mpT3 pN1-2, stage IIA invasive ductal carcinoma, grade 1, with negative margins             (A) a total of 2 right axillary lymph nodes were removed, both positive, 1 with extracapsular extension             (B) completion axillary dissection being considered   (3) adjuvant chemotherapy with docetaxel, cyclophosphamide given on day 1 of a 21 day cycle x 4 from 06/30/2021 through 09/01/2021   (  4) adjuvant radiation completed 11/08/2021   (5) tamoxifen started 05/04/2021 in anticipation of surgical delays; held during chemotherapy.   #6 she completed adjuvant chemotherapy on 08/31/2021  PLAN She is now on adjuvant tamoxifen. She has recently started noticing dizziness, headaches and some anxiety especially when she is driving on the highways. Given her locally advanced breast cancer at diagnosis, we will consider MRI brain. If this is unremarkable, she may try something for anxiety and see if this alleviates the symptoms. RTC in 6 months.  Total encounter time:30 minutes*in face-to-face visit time, chart review, lab review, care coordination, order entry, and documentation of the encounter time. Tel. [phone]    Fax. [phone]  *Total Encounter Time as defined by the Centers for Medicare and Medicaid Services includes, in addition to the face-to-face time of a patient visit (documented in the note above) non-face-to-face time: obtaining and reviewing outside history, ordering and reviewing medications, tests or procedures, care coordination (communications with other health care professionals or caregivers) and documentation in the medical record.

## 2023-03-10 LAB — LIPID PANEL
Chol/HDL Ratio: 2.9 ratio (ref 0.0–4.4)
Cholesterol, Total: 196 mg/dL (ref 100–199)
HDL: 67 mg/dL (ref >39)
LDL Chol Calc (NIH): 107 mg/dL — ABNORMAL HIGH (ref 0–99)
Triglycerides: 127 mg/dL (ref 0–149)
VLDL Cholesterol Cal: 22 mg/dL (ref 5–40)

## 2023-03-10 LAB — TSH: TSH: 0.439 u[IU]/mL — ABNORMAL LOW (ref 0.450–4.500)

## 2023-03-10 LAB — T4, FREE: Free T4: 1.67 ng/dL (ref 0.82–1.77)

## 2023-03-21 ENCOUNTER — Encounter: Payer: Self-pay | Admitting: "Endocrinology

## 2023-03-21 ENCOUNTER — Ambulatory Visit: Payer: BC Managed Care – PPO | Admitting: "Endocrinology

## 2023-03-21 VITALS — BP 94/68 | HR 64 | Ht 61.0 in | Wt 124.0 lb

## 2023-03-21 DIAGNOSIS — E890 Postprocedural hypothyroidism: Secondary | ICD-10-CM | POA: Diagnosis not present

## 2023-03-21 DIAGNOSIS — E782 Mixed hyperlipidemia: Secondary | ICD-10-CM

## 2023-03-21 MED ORDER — LEVOTHYROXINE SODIUM 88 MCG PO TABS: 88.0000 ug | ORAL_TABLET | Freq: Every day | ORAL | 1 refills | Status: DC

## 2023-03-21 NOTE — Progress Notes (Signed)
03/21/2023  Endocrinology follow-up note   Subjective:    Patient ID: Sophia Moore, female    DOB: 1980/11/07, PCP [facility]   Past Medical History:  Diagnosis Date   Depression    takes Citalopram daily   Family history of breast cancer    Personal history of chemotherapy    Personal history of radiation therapy    Thyroid nodule 07/2016   Past Surgical History:  Procedure Laterality Date   BREAST BIOPSY Right 04/2021   IR IMAGING GUIDED PORT INSERTION  06/27/2021   IR REMOVAL TUN ACCESS W/ PORT W/O FL MOD SED  11/29/2021   MASTECTOMY Right 05/2021   MASTECTOMY W/ SENTINEL NODE BIOPSY Right 06/01/2021   Procedure: RIGHT MASTECTOMY WITH AXILLARY SENTINEL LYMPH NODE BIOPSY;  Surgeon: Emelia Loron, MD;  Location: [facility];  Service: General;  Laterality: Right;   THYROIDECTOMY  09/13/2016   THYROIDECTOMY N/A 09/13/2016   Procedure: TOTAL THYROIDECTOMY;  Surgeon: Newman Pies, MD;  Location: [facility];  Service: ENT;  Laterality: N/A;   wisdom teeth extracted      WISDOM TOOTH EXTRACTION     Social History   Socioeconomic History   Marital status: Married    Spouse name: Not on file   Number of children: Not on file   Years of education: Not on file   Highest education level: Not on file  Occupational History   Not on file  Tobacco Use   Smoking status: Never   Smokeless tobacco: Never  Vaping Use   Vaping Use: Never used  Substance and Sexual Activity   Alcohol use: No   Drug use: No   Sexual activity: Not Currently    Birth control/protection: None  Other Topics Concern   Not on file  Social History Narrative   Not on file   Social Determinants of Health   Financial Resource Strain: High Risk (07/20/2021)   Overall Financial Resource Strain (CARDIA)    Difficulty of Paying Living Expenses: Very hard  Food Insecurity: Not on file  Transportation Needs: No Transportation Needs (05/04/2021)    PRAPARE - Transportation    Lack of Transportation (Medical): No    Lack of Transportation (Non-Medical): No  Physical Activity: Not on file  Stress: Not on file  Social Connections: Not on file   Outpatient Encounter Medications as of 03/21/2023  Medication Sig   cholecalciferol (VITAMIN D3) 25 MCG (1000 UNIT) tablet Take 1,000 Units by mouth daily.   citalopram (CELEXA) 20 MG tablet Take 20 mg by mouth daily.   doxycycline (VIBRA-TABS) 100 MG tablet Take by mouth.   levothyroxine (SYNTHROID) 88 MCG tablet Take 1 tablet (88 mcg total) by mouth daily before breakfast.   olopatadine (PATADAY) 0.1 % ophthalmic solution Place 1 drop into both eyes 2 (two) times daily.   tamoxifen (NOLVADEX) 20 MG tablet Take 1 tablet (20 mg total) by mouth daily.   [DISCONTINUED] levothyroxine (SYNTHROID) 100 MCG tablet TAKE 1 TABLET BY MOUTH DAILY BEFORE BREAKFAST.   No facility-administered encounter medications on file as of 03/21/2023.   ALLERGIES: Allergies  Allergen Reactions   Amoxicillin Rash   Penicillins Rash    Has patient had a PCN reaction causing immediate rash, facial/tongue/throat swelling, SOB or lightheadedness with hypotension: Yes Has patient had a PCN reaction causing severe rash involving mucus membranes or skin necrosis: No Has patient had a PCN reaction that required hospitalization No Has patient had a PCN reaction occurring within the  last 10 years: Yes If all of the above answers are "NO", then may proceed with Cephalosporin use.    VACCINATION STATUS: Immunization History  Administered Date(s) Administered   PPD Test 04/24/2016   Tdap 07/02/2020    HPI 42 year old female patient with medical history as above. 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 - Her surgical pathology was negative for malignancy.  She is currently on levothyroxine 100 mcg p.o. daily before breakfast.  Her previsit  thyroid function tests are consistent with slight over-replacement.    -Recently, she was diagnosed with breast cancer s/p surgery and chemotherapy.  She reports symptoms of fatigue, and few pounds of weight gain. She denies palpitations, tremors, nor heat intolerance.  - She denies family history of thyroid dysfunction. She denies exposure to neck radiation. She denies heat/cold intolerance. She has steady body weight. Denies dysphagia, shortness of breath, nor voice change.  Review of Systems  Constitutional:  + Slightly fluctuating heart rate,  no fatigue, no subjective hyperthermia/hypothermia   Objective:    BP 94/68   Pulse 64   Ht 5\' 1"  (1.549 m)   Wt 124 lb (56.2 kg)   BMI 23.43 kg/m   Wt Readings from Last 3 Encounters:  03/21/23 124 lb (56.2 kg)  02/06/23 123 lb 4.8 oz (55.9 kg)  09/19/22 125 lb 12.8 oz (57.1 kg)      Physical Exam- Limited  Constitutional:  Body mass index is 23.43 kg/m. , not in acute distress, normal state of mind       09/13/2016 surgical pathology of total thyroidectomy-no malignancy. Thyroid, thyroidectomy, Right - FOLLICULAR ADENOMA WITH CALCIFICATIONS, 2.1 CM. - MICROSCOPIC INTRATHYROIDAL PARATHYROID TISSUE. - THERE IS NO EVIDENCE OF MALIGNANCY.  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  January 29, 2018 thyroid/neck ultrasound: Thyroid surgically absent with no evidence of remnant or recurrence.  Recent Results (from the past 2160 hour(s))  TSH     Status: Abnormal   Collection Time: 03/09/23  8:08 AM  Result Value Ref Range   TSH 0.439 (L) 0.450 - 4.500 uIU/mL  T4, free     Status: None   Collection Time: 03/09/23  8:08 AM  Result Value Ref Range   Free T4 1.67 0.82 - 1.77 ng/dL  Lipid panel     Status: Abnormal   Collection Time: 03/09/23  8:08 AM  Result Value Ref Range   Cholesterol,  Total 196 100 - 199 mg/dL   Triglycerides 161 0 - 149 mg/dL   HDL 67 >09 mg/dL   VLDL Cholesterol Cal 22 5 - 40 mg/dL   LDL Chol Calc (NIH) 604 (H) 0 - 99 mg/dL   Chol/HDL Ratio 2.9 0.0 - 4.4 ratio    Comment:                                   T. 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    Her surgical sample diagnosis showed no malignancy.    - She did not require radioactive iodine ablative therapy.   -Regarding her postsurgical hypothyroidism: -Her thyroid function tests are consistent with slight over-replacement.  I discussed and lowered her levothyroxine to 88 mcg p.o. daily before breakfast.     - We discussed about the correct intake of her thyroid hormone, on empty stomach at fasting, with water, separated by at least 30 minutes from breakfast and other medications,  and separated by more than 4 hours from calcium, iron, multivitamins, acid reflux medications (PPIs). -Patient is made aware of the fact that thyroid hormone replacement is needed for life, dose to be adjusted by periodic monitoring of thyroid function tests.   She is encouraged to maintain close follow-up with her oncology service. She has hyperlipidemia with LDL of 107.  She does not want antilipid medication.  Will avoid plant-based diet was discussed and recommended to her.  - I advised patient to maintain close follow up with [facility] for primary care needs.   I spent  21  minutes in the care of the patient today including review of labs from  Thyroid Function, CMP, and other relevant labs ; imaging/biopsy records (current and previous including abstractions from other facilities); face-to-face time discussing  her lab results and symptoms, medications doses, her options of short and long term treatment based on the latest standards of care / guidelines;   and documenting the encounter.  Elvera Bicker Turrubiartes  participated in the discussions, expressed understanding, and voiced agreement with the above plans.  All questions were answered to her satisfaction. she is encouraged to contact clinic should she have any questions or concerns prior to her return visit.    Follow up plan: Return in about 4 months (around 07/21/2023) for Fasting Labs  in AM B4 8.  Marquis Lunch, MD Phone: [phone]  Fax: [phone]  -  This note was partially dictated with voice recognition software. Similar sounding words can be transcribed inadequately or may not  be corrected upon review.  03/21/2023, 8:46 AM

## 2023-06-26 ENCOUNTER — Telehealth: Payer: Self-pay | Admitting: Hematology and Oncology

## 2023-06-26 NOTE — Telephone Encounter (Signed)
Left a patient a message in regards to rescheduled appointment times/dates

## 2023-07-14 LAB — LIPID PANEL
Chol/HDL Ratio: 2.8 ratio (ref 0.0–4.4)
Cholesterol, Total: 185 mg/dL (ref 100–199)
HDL: 67 mg/dL (ref >39)
LDL Chol Calc (NIH): 102 mg/dL — ABNORMAL HIGH (ref 0–99)
Triglycerides: 88 mg/dL (ref 0–149)
VLDL Cholesterol Cal: 16 mg/dL (ref 5–40)

## 2023-07-14 LAB — T4, FREE: Free T4: 1.43 ng/dL (ref 0.82–1.77)

## 2023-07-14 LAB — TSH: TSH: 6.61 u[IU]/mL — ABNORMAL HIGH (ref 0.450–4.500)

## 2023-07-23 ENCOUNTER — Encounter: Payer: Self-pay | Admitting: "Endocrinology

## 2023-07-23 ENCOUNTER — Ambulatory Visit: Payer: BC Managed Care – PPO | Admitting: "Endocrinology

## 2023-07-23 VITALS — BP 94/66 | HR 68 | Ht 61.0 in | Wt 125.6 lb

## 2023-07-23 DIAGNOSIS — E782 Mixed hyperlipidemia: Secondary | ICD-10-CM | POA: Diagnosis not present

## 2023-07-23 DIAGNOSIS — E890 Postprocedural hypothyroidism: Secondary | ICD-10-CM | POA: Diagnosis not present

## 2023-07-23 MED ORDER — LEVOTHYROXINE SODIUM 100 MCG PO TABS: 100.0000 ug | ORAL_TABLET | Freq: Every day | ORAL | 1 refills | Status: DC

## 2023-07-23 NOTE — Progress Notes (Signed)
07/23/2023  Endocrinology follow-up note   Subjective:    Patient ID: Sophia Moore, female    DOB: [birth year], PCP [facility]   Past Medical History:  Diagnosis Date   Depression    takes Citalopram daily   Family history of breast cancer    Personal history of chemotherapy    Personal history of radiation therapy    Thyroid nodule 07/2016   Past Surgical History:  Procedure Laterality Date   BREAST BIOPSY Right 04/2021   IR IMAGING GUIDED PORT INSERTION  06/27/2021   IR REMOVAL TUN ACCESS W/ PORT W/O FL MOD SED  11/29/2021   MASTECTOMY Right 05/2021   MASTECTOMY W/ SENTINEL NODE BIOPSY Right 06/01/2021   Procedure: RIGHT MASTECTOMY WITH AXILLARY SENTINEL LYMPH NODE BIOPSY;  Surgeon: Emelia Loron, MD;  Location: [facility];  Service: General;  Laterality: Right;   THYROIDECTOMY  09/13/2016   THYROIDECTOMY N/A 09/13/2016   Procedure: TOTAL THYROIDECTOMY;  Surgeon: Newman Pies, MD;  Location: [facility];  Service: ENT;  Laterality: N/A;   wisdom teeth extracted      WISDOM TOOTH EXTRACTION     Social History   Socioeconomic History   Marital status: Married    Spouse name: Not on file   Number of children: Not on file   Years of education: Not on file   Highest education level: Not on file  Occupational History   Not on file  Tobacco Use   Smoking status: Never   Smokeless tobacco: Never  Vaping Use   Vaping status: Never Used  Substance and Sexual Activity   Alcohol use: No   Drug use: No   Sexual activity: Not Currently    Birth control/protection: None  Other Topics Concern   Not on file  Social History Narrative   Not on file   Social Determinants of Health   Financial Resource Strain: High Risk (07/20/2021)   Overall Financial Resource Strain (CARDIA)    Difficulty of Paying Living Expenses: Very hard  Food Insecurity: Not on file  Transportation Needs: No Transportation Needs (05/04/2021)    PRAPARE - Transportation    Lack of Transportation (Medical): No    Lack of Transportation (Non-Medical): No  Physical Activity: Not on file  Stress: Not on file  Social Connections: Not on file   Outpatient Encounter Medications as of 07/23/2023  Medication Sig   cholecalciferol (VITAMIN D3) 25 MCG (1000 UNIT) tablet Take 1,000 Units by mouth daily.   citalopram (CELEXA) 20 MG tablet Take 20 mg by mouth daily.   doxycycline (VIBRA-TABS) 100 MG tablet Take by mouth.   levothyroxine (SYNTHROID) 100 MCG tablet Take 1 tablet (100 mcg total) by mouth daily before breakfast.   olopatadine (PATADAY) 0.1 % ophthalmic solution Place 1 drop into both eyes 2 (two) times daily.   tamoxifen (NOLVADEX) 20 MG tablet Take 1 tablet (20 mg total) by mouth daily.   [DISCONTINUED] levothyroxine (SYNTHROID) 88 MCG tablet Take 1 tablet (88 mcg total) by mouth daily before breakfast.   No facility-administered encounter medications on file as of 07/23/2023.   ALLERGIES: Allergies  Allergen Reactions   Amoxicillin Rash   Penicillins Rash    Has patient had a PCN reaction causing immediate rash, facial/tongue/throat swelling, SOB or lightheadedness with hypotension: Yes Has patient had a PCN reaction causing severe rash involving mucus membranes or skin necrosis: No Has patient had a PCN reaction that required hospitalization No Has patient had a PCN reaction  occurring within the last 10 years: Yes If all of the above answers are "NO", then may proceed with Cephalosporin use.    VACCINATION STATUS: Immunization History  Administered Date(s) Administered   PPD Test 04/24/2016   Tdap 07/02/2020    HPI 42 year old female patient with medical history as above. 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  - Her surgical pathology was negative for malignancy.  She is currently on levothyroxine 88 mcg p.o. daily before  breakfast.  Her previsit thyroid function tests are consistent with slight under replacement.      -Recently, she was diagnosed with breast cancer s/p surgery and chemotherapy.  She reports symptoms of fatigue, and few pounds of weight gain. She denies palpitations, tremors, nor heat intolerance.  - She denies family history of thyroid dysfunction. She denies exposure to neck radiation. She denies heat/cold intolerance. She has steady body weight. Denies dysphagia, shortness of breath, nor voice change.  Review of Systems  Constitutional:  + Slightly fluctuating heart rate,  no fatigue, no subjective hyperthermia/hypothermia   Objective:    BP 94/66   Pulse 68   Ht 5\' 1"  (1.549 m)   Wt 125 lb 9.6 oz (57 kg)   BMI 23.73 kg/m   Wt Readings from Last 3 Encounters:  07/23/23 125 lb 9.6 oz (57 kg)  03/21/23 124 lb (56.2 kg)  02/06/23 123 lb 4.8 oz (55.9 kg)      Physical Exam- Limited  Constitutional:  Body mass index is 23.73 kg/m. , not in acute distress, normal state of mind       09/13/2016 surgical pathology of total thyroidectomy-no malignancy. Thyroid, thyroidectomy, Right - FOLLICULAR ADENOMA WITH CALCIFICATIONS, 2.1 CM. - MICROSCOPIC INTRATHYROIDAL PARATHYROID TISSUE. - THERE IS NO EVIDENCE OF MALIGNANCY.  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  January 29, 2018 thyroid/neck ultrasound: Thyroid surgically absent with no evidence of remnant or recurrence.  Recent Results (from the past 2160 hour(s))  TSH     Status: Abnormal   Collection Time: 07/13/23  8:15 AM  Result Value Ref Range   TSH 6.610 (H) 0.450 - 4.500 uIU/mL  T4, free     Status: None   Collection Time: 07/13/23  8:15 AM  Result Value Ref Range   Free T4 1.43 0.82 - 1.77 ng/dL  Lipid panel     Status: Abnormal   Collection Time: 07/13/23  8:15 AM  Result  Value Ref Range   Cholesterol, Total 185 100 - 199 mg/dL   Triglycerides 88 0 - 149 mg/dL   HDL 67 >40 mg/dL   VLDL Cholesterol Cal 16 5 - 40 mg/dL   LDL Chol Calc (NIH) 981 (H) 0 - 99 mg/dL   Chol/HDL Ratio 2.8 0.0 - 4.4 ratio    Comment:                                   T. 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    Her surgical sample diagnosis showed no malignancy.    - She did not require radioactive iodine ablative therapy.   -Regarding her postsurgical hypothyroidism: -Her thyroid function tests are consistent with slight under replacement.  I discussed and increased her levothyroxine back to 100 mcg p.o. daily before breakfast.     - We discussed about the correct intake of her thyroid hormone, on empty stomach at fasting, with water, separated by at least 30 minutes from breakfast and other medications,  and separated by more than 4 hours from calcium, iron, multivitamins, acid reflux medications (PPIs). -Patient is made aware of the fact that thyroid hormone replacement is needed for life, dose to be adjusted by periodic monitoring of thyroid function tests.   She is encouraged to maintain close follow-up with her oncology service. She has hyperlipidemia with LDL of 102.  She does not want antilipid medication.  She was adopted plant-based protein, minimizing her intake of edema 5.    - I advised patient to maintain close follow up with [facility] for primary care needs.   I spent  22  minutes in the care of the  patient today including review of labs from Thyroid Function, CMP, and other relevant labs ; imaging/biopsy records (current and previous including abstractions from other facilities); face-to-face time discussing  her lab results and symptoms, medications doses, her options of short and long term treatment based on the latest standards of care / guidelines;   and documenting the encounter.  Elvera Bicker Turrubiartes  participated in the discussions, expressed understanding, and voiced agreement with the above plans.  All questions were answered to her satisfaction. she is encouraged to contact clinic should she have any questions or concerns prior to her return visit.   Follow up plan: Return in about 6 months (around 01/21/2024) for Fasting Labs  in AM B4 8.  Marquis Lunch, MD Phone: [phone]  Fax: [phone]  -  This note was partially dictated with voice recognition software. Similar sounding words can be transcribed inadequately or may not  be corrected upon review.  07/23/2023, 3:37 PM

## 2023-08-10 ENCOUNTER — Ambulatory Visit: Payer: BC Managed Care – PPO | Admitting: Hematology and Oncology

## 2023-08-16 ENCOUNTER — Encounter: Payer: Self-pay | Admitting: Hematology and Oncology

## 2023-08-16 ENCOUNTER — Inpatient Hospital Stay: Payer: BC Managed Care – PPO | Attending: Hematology and Oncology | Admitting: Hematology and Oncology

## 2023-08-16 VITALS — BP 112/63 | HR 63 | Temp 97.9°F | Resp 16 | Wt 124.5 lb

## 2023-08-16 DIAGNOSIS — Z9221 Personal history of antineoplastic chemotherapy: Secondary | ICD-10-CM | POA: Insufficient documentation

## 2023-08-16 DIAGNOSIS — K117 Disturbances of salivary secretion: Secondary | ICD-10-CM | POA: Diagnosis not present

## 2023-08-16 DIAGNOSIS — Z1721 Progesterone receptor positive status: Secondary | ICD-10-CM | POA: Diagnosis not present

## 2023-08-16 DIAGNOSIS — R682 Dry mouth, unspecified: Secondary | ICD-10-CM | POA: Diagnosis not present

## 2023-08-16 DIAGNOSIS — Z83438 Family history of other disorder of lipoprotein metabolism and other lipidemia: Secondary | ICD-10-CM | POA: Diagnosis not present

## 2023-08-16 DIAGNOSIS — Z803 Family history of malignant neoplasm of breast: Secondary | ICD-10-CM | POA: Diagnosis not present

## 2023-08-16 DIAGNOSIS — Z79899 Other long term (current) drug therapy: Secondary | ICD-10-CM | POA: Diagnosis not present

## 2023-08-16 DIAGNOSIS — Z5986 Financial insecurity: Secondary | ICD-10-CM | POA: Insufficient documentation

## 2023-08-16 DIAGNOSIS — E782 Mixed hyperlipidemia: Secondary | ICD-10-CM | POA: Insufficient documentation

## 2023-08-16 DIAGNOSIS — F32A Depression, unspecified: Secondary | ICD-10-CM | POA: Diagnosis not present

## 2023-08-16 DIAGNOSIS — Z923 Personal history of irradiation: Secondary | ICD-10-CM | POA: Insufficient documentation

## 2023-08-16 DIAGNOSIS — Z823 Family history of stroke: Secondary | ICD-10-CM | POA: Insufficient documentation

## 2023-08-16 DIAGNOSIS — C50411 Malignant neoplasm of upper-outer quadrant of right female breast: Secondary | ICD-10-CM

## 2023-08-16 DIAGNOSIS — Z170 Estrogen receptor positive status [ER+]: Secondary | ICD-10-CM | POA: Diagnosis not present

## 2023-08-16 DIAGNOSIS — M7989 Other specified soft tissue disorders: Secondary | ICD-10-CM | POA: Insufficient documentation

## 2023-08-16 DIAGNOSIS — Z79810 Long term (current) use of selective estrogen receptor modulators (SERMs): Secondary | ICD-10-CM | POA: Diagnosis not present

## 2023-08-16 DIAGNOSIS — Z9011 Acquired absence of right breast and nipple: Secondary | ICD-10-CM | POA: Insufficient documentation

## 2023-08-16 NOTE — Progress Notes (Signed)
Sophia Moore:    [facility] [address] [place] [address]   DIAGNOSIS:  Cancer Staging  Malignant neoplasm of upper-outer quadrant of right breast in female, estrogen receptor positive (HCC) Staging form: Breast, AJCC 8th Edition - Clinical stage from 05/04/2021: Stage IIA (cT3, cN0, cM0, G2, ER+, PR+, HER2-) - Signed by Sophia Dell, MD on 05/04/2021 Stage prefix: Initial diagnosis Histologic grading system: 3 grade system Laterality: Right Staged by: Pathologist and managing physician Stage used in treatment planning: Yes National guidelines used in treatment planning: Yes Type of national guideline used in treatment planning: NCCN   SUMMARY OF ONCOLOGIC HISTORY: Oncology History  Malignant neoplasm of upper-outer quadrant of right breast in female, estrogen receptor positive (HCC)  05/02/2021 Initial Diagnosis   Malignant neoplasm of upper-outer quadrant of right breast in female, estrogen receptor positive (HCC)   05/04/2021 Cancer Staging   Staging form: Breast, AJCC 8th Edition - Clinical stage from 05/04/2021: Stage IIA (cT3, cN0, cM0, G2, ER+, PR+, HER2-) - Signed by Sophia Dell, MD on 05/04/2021 Stage prefix: Initial diagnosis Histologic grading system: 3 grade system Laterality: Right Staged by: Pathologist and managing physician Stage used in treatment planning: Yes National guidelines used in treatment planning: Yes Type of national guideline used in treatment planning: NCCN   05/11/2021 Genetic Testing   Negative genetic testing:  No pathogenic variants detected on the [employer] BRCAplus panel (report date 05/11/2021) or CancerNext-Expanded + RNAinsight panel (report date 05/16/2021). A variant of uncertain significance (VUS) was detected in the [facility] gene called p.R1369H (c.4106G>A).  The BRCAplus panel offered by [employer] and includes sequencing and deletion/duplication analysis  for the following 8 genes: ATM, BRCA1, BRCA2, CDH1, CHEK2, PALB2, PTEN, and TP53. The CancerNext-Expanded + RNAinsight gene panel offered by [employer] and includes sequencing and rearrangement analysis for the following 77 genes: AIP, ALK, APC, ATM, AXIN2, BAP1, BARD1, BLM, BMPR1A, BRCA1, BRCA2, BRIP1, CDC73, CDH1, CDK4, CDKN1B, CDKN2A, CHEK2, CTNNA1, DICER1, FANCC, FH, FLCN, GALNT12, KIF1B, LZTR1, MAX, MEN1, MET, MLH1, MSH2, MSH3, MSH6, MUTYH, NBN, NF1, NF2, NTHL1, PALB2, PHOX2B, PMS2, POT1, PRKAR1A, PTCH1, PTEN, RAD51C, RAD51D, RB1, RECQL, RET, SDHA, SDHAF2, SDHB, SDHC, SDHD, SMAD4, SMARCA4, SMARCB1, SMARCE1, STK11, SUFU, TMEM127, TP53, TSC1, TSC2, VHL and XRCC2 (sequencing and deletion/duplication); EGFR, EGLN1, HOXB13, KIT, MITF, PDGFRA, POLD1 and POLE (sequencing only); EPCAM and GREM1 (deletion/duplication only). RNA data is routinely analyzed for use in variant interpretation for all genes.   06/01/2021 Surgery   status post right mastectomy and sentinel lymph node sampling 06/01/2021 for an mpT3 pN1-2, stage IIA invasive ductal carcinoma, grade 1, with negative margins             (A) a total of 2 right axillary lymph nodes were removed, both positive, 1 with extracapsular extension             (B) completion axillary dissection being considered   06/30/2021 - 09/02/2021 Chemotherapy   Patient is on Treatment Plan : BREAST TC q21d     09/26/2021 - 11/08/2021 Radiation Therapy   09/26/2021 through 11/08/2021 Site Technique Total Dose (Gy) Dose per Fx (Gy) Completed Fx Beam Energies  Chest Wall, Right: CW_R_IMN 3D 50/50 2 25/25 6X  Chest Wall, Right: CW_R_PAB_SCV 3D 50/50 2 25/25 6X, 10X  Chest Wall, Right: CW_R_Bst Electron 10/10 2 5/5 6E     11/2021 -  Anti-estrogen oral therapy   Tamoxifen daily     CURRENT THERAPY:  Tamoxifen  INTERVAL HISTORY:  Sophia Moore 42 y.o. female returns for f/u on Tamoxifen daily.  She is taking 10mg  daily.   Discussed the use of AI  scribe software for clinical note transcription with the patient, who gave verbal consent to proceed.  History of Present Illness    The patient, with a history of breast cancer treated with a right-sided mastectomy and ongoing tamoxifen therapy, presents with dry mouth, recurrent yeast infections, and bilateral foot swelling. She reports that her mouth is particularly dry in the mornings, requiring her to drink water to alleviate the discomfort. She denies starting any new medications recently. She has been to the doctor three times for yeast infections since her last visit. Despite these recurrent infections, her glucose levels have been tested and are normal. She also reports bilateral foot swelling, but does not provide further details on the onset, duration, or any associated symptoms. She has gained some weight but is satisfied with her current weight status. She also mentions a history of high cholesterol, for which she has been advised to modify her diet. She [place] a [place] mammogram [place] and is now required to pay a no-show fee to reschedule. No other complaints today Rest of the pertinent 10 point ROS reviewed and neg.  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  MEDICAL HISTORY: Past Medical History:  Diagnosis Date   Depression    takes Citalopram daily   Family history of breast cancer    Personal history of chemotherapy    Personal history of radiation therapy    Thyroid nodule 07/2016     SURGICAL HISTORY: Past Surgical History:  Procedure Laterality Date   BREAST BIOPSY Right 04/2021   IR IMAGING GUIDED PORT INSERTION  06/27/2021   IR REMOVAL TUN ACCESS W/ PORT W/O FL MOD SED  11/29/2021   MASTECTOMY Right 05/2021   MASTECTOMY W/ SENTINEL NODE BIOPSY Right 06/01/2021   Procedure: RIGHT MASTECTOMY WITH AXILLARY SENTINEL LYMPH NODE BIOPSY;  Surgeon: Emelia Loron, MD;  Location: [facility];  Service: General;  Laterality: Right;   THYROIDECTOMY  09/13/2016   THYROIDECTOMY N/A 09/13/2016   Procedure: TOTAL THYROIDECTOMY;  Surgeon: Newman Pies, MD;  Location: [facility];  Service: ENT;  Laterality: N/A;   wisdom teeth extracted      WISDOM TOOTH EXTRACTION      SOCIAL HISTORY: Social History   Socioeconomic History   Marital status: Married    Spouse name: Not on file   Number of children: Not on file   Years of education: Not on file   Highest education level: Not on file  Occupational History   Not on file  Tobacco Use   Smoking status: Never   Smokeless tobacco: Never  Vaping Use   Vaping status: Never Used  Substance and Sexual Activity   Alcohol use: No   Drug use: No   Sexual activity: Not Currently    Birth control/protection: None  Other Topics Concern   Not on file  Social History Narrative  Not on file   Social Determinants of Health   Financial Resource Strain: High Risk (07/20/2021)   Overall Financial Resource Strain (CARDIA)    Difficulty of Paying Living Expenses: Very hard  Food Insecurity: Not on file  Transportation Needs: No Transportation Needs (05/04/2021)   PRAPARE - Administrator, Civil Service (Medical): No    Lack of Transportation (Non-Medical): No  Physical Activity: Not on file  Stress: Not on file  Social Connections: Not on file  Intimate Partner Violence: Not on file    FAMILY HISTORY: Family History  Problem Relation Age of Onset   Hyperlipidemia Mother    Stroke Mother    Breast  cancer Maternal Aunt        dx 27s        PHYSICAL EXAMINATION  ECOG PERFORMANCE STATUS: 1 - Symptomatic but completely ambulatory  Vitals:   08/16/23 1529  BP: 112/63  Pulse: 63  Resp: 16  Temp: 97.9 F (36.6 C)  SpO2: 100%    Physical Exam Constitutional:      General: She is not in acute distress.    Appearance: Normal appearance. She is not toxic-appearing.  HENT:     Head: Normocephalic and atraumatic.  Eyes:     General: No scleral icterus. Cardiovascular:     Rate and Rhythm: Normal rate and regular rhythm.     Pulses: Normal pulses.     Heart sounds: Normal heart sounds.  Pulmonary:     Effort: Pulmonary effort is normal.     Breath sounds: Normal breath sounds.  Chest:     Comments: Right breast status postmastectomy and radiation no sign of local recurrence left breast is benign. Abdominal:     General: Abdomen is flat. Bowel sounds are normal. There is no distension.     Palpations: Abdomen is soft.     Tenderness: There is no abdominal tenderness.  Musculoskeletal:        General: No swelling.     Cervical back: Neck supple.  Lymphadenopathy:     Cervical: No cervical adenopathy.  Skin:    General: Skin is warm and dry.     Findings: No rash.  Neurological:     General: No focal deficit present.     Mental Status: She is alert.  Psychiatric:        Mood and Affect: Mood normal.        Behavior: Behavior normal.    LABORATORY DATA: None for this visit   ASSESSMENT and THERAPY PLAN:   ASSESSMENT: 42 y.o. Spanish speaker status post right breast upper outer quadrant biopsy 04/26/2021 for a clinical mT3 N0, stage IIA invasive ductal carcinoma, grade 1 or 2, estrogen and progesterone receptor positive, HER2 not amplified, with an MIB-1 of 5%.   (1) genetics testing 05/16/2021 through the [employer] panel (report date 05/11/2021) or CancerNext-Expanded + RNAinsight panel (report date 05/16/2021).   The BRCAplus panel offered by [employer]  and includes sequencing and deletion/duplication analysis for the following 8 genes: ATM, BRCA1, BRCA2, CDH1, CHEK2, PALB2, PTEN, and TP53. The CancerNext-Expanded + RNAinsight gene panel offered by [employer] and includes sequencing and rearrangement analysis for the following 77 genes: AIP, ALK, APC, ATM, AXIN2, BAP1, BARD1, BLM, BMPR1A, BRCA1, BRCA2, BRIP1, CDC73, CDH1, CDK4, CDKN1B, CDKN2A, CHEK2, CTNNA1, DICER1, FANCC, FH, FLCN, GALNT12, KIF1B, LZTR1, MAX, MEN1, MET, MLH1, MSH2, MSH3, MSH6, MUTYH, NBN, NF1, NF2, NTHL1, PALB2, PHOX2B, PMS2, POT1, PRKAR1A, PTCH1, PTEN, RAD51C, RAD51D, RB1, RECQL,  RET, SDHA, SDHAF2, SDHB, SDHC, SDHD, SMAD4, SMARCA4, SMARCB1, SMARCE1, STK11, SUFU, TMEM127, TP53, TSC1, TSC2, VHL and XRCC2 (sequencing and deletion/duplication); EGFR, EGLN1, HOXB13, KIT, MITF, PDGFRA, POLD1 and POLE (sequencing only); EPCAM and GREM1 (deletion/duplication only). RNA data is routinely analyzed for use in variant interpretation for all genes.             (A) A variant of uncertain significance (VUS) was detected in the [facility] gene called p.R1369H (c.4106G>A).   (2) MammaPrint obtained from the 04/26/2021 biopsy returned luminal a type, low risk, with a predicted benefit from additional chemotherapy in the 1-2% range [given the patient's young age however, the benefit of chemotherapy might be as high as 5%].   (3) status post right mastectomy and sentinel lymph node sampling 06/01/2021 for an mpT3 pN1-2, stage IIA invasive ductal carcinoma, grade 1, with negative margins             (A) a total of 2 right axillary lymph nodes were removed, both positive, 1 with extracapsular extension             (B) completion axillary dissection being considered   (3) adjuvant chemotherapy with docetaxel, cyclophosphamide given on day 1 of a 21 day cycle x 4 from 06/30/2021 through 09/01/2021   (4) adjuvant radiation completed 11/08/2021   (5) tamoxifen started 05/04/2021 in anticipation of surgical  delays; held during chemotherapy.   #6 she completed adjuvant chemotherapy on 08/31/2021  PLAN She is now on adjuvant tamoxifen.  Breast Cancer Stable on Tamoxifen for 1 year and 9 months. No new symptoms or physical exam findings suggestive of recurrence. -Continue Tamoxifen as prescribed.  Xerostomia Reports dry mouth, particularly upon waking. Currently on Tamoxifen, Celexa, and Levothyroxine. Celexa may be contributing as well. -Encouraged to maintain adequate hydration.  Recurrent Yeast Infections Reports frequent yeast infections. Tamoxifen may be altering vaginal pH. -Recommended over-the-counter clotrimazole wipes.  Bilateral Lower Extremity Edema Reports bilateral lower extremity swelling. Physical exam confirms mild, symmetrical edema. -Continue monitoring for changes or worsening.  Missed Mammogram Missed scheduled mammogram due to being [place] of the country. Facility requesting missed appointment fee before rescheduling. -Attempt to have fee waived and reschedule mammogram.  Hypercholesterolemia Reports elevated cholesterol on two recent occasions. -Encouraged dietary modifications, including reducing intake of cheese and meats, and increasing intake of grains.  General Health Maintenance -Encouraged to maintain a healthy weight and continue regular exercise.  Total encounter time:30 minutes*in face-to-face visit time, chart review, lab review, care coordination, order entry, and documentation of the encounter time.   *Total Encounter Time as defined by the Centers for Medicare and Medicaid Services includes, in addition to the face-to-face time of a patient visit (documented in the note above) non-face-to-face time: obtaining and reviewing outside history, ordering and reviewing medications, tests or procedures, care coordination (communications with other health care professionals or caregivers) and documentation in the medical record.

## 2023-09-30 ENCOUNTER — Other Ambulatory Visit: Payer: Self-pay | Admitting: "Endocrinology

## 2024-01-17 ENCOUNTER — Other Ambulatory Visit: Payer: Self-pay | Admitting: *Deleted

## 2024-01-17 ENCOUNTER — Telehealth: Payer: Self-pay | Admitting: "Endocrinology

## 2024-01-17 DIAGNOSIS — E890 Postprocedural hypothyroidism: Secondary | ICD-10-CM

## 2024-01-17 DIAGNOSIS — E782 Mixed hyperlipidemia: Secondary | ICD-10-CM

## 2024-01-17 NOTE — Telephone Encounter (Signed)
Labs have been updated . 

## 2024-01-17 NOTE — Telephone Encounter (Signed)
 Pt needs labs updated

## 2024-01-21 ENCOUNTER — Ambulatory Visit: Payer: BC Managed Care – PPO | Admitting: "Endocrinology

## 2024-01-29 LAB — LIPID PANEL
Chol/HDL Ratio: 2.9 ratio (ref 0.0–4.4)
Cholesterol, Total: 163 mg/dL (ref 100–199)
HDL: 57 mg/dL (ref >39)
LDL Chol Calc (NIH): 87 mg/dL (ref 0–99)
Triglycerides: 102 mg/dL (ref 0–149)
VLDL Cholesterol Cal: 19 mg/dL (ref 5–40)

## 2024-01-29 LAB — TSH: TSH: 1.27 u[IU]/mL (ref 0.450–4.500)

## 2024-01-29 LAB — T4, FREE: Free T4: 1.51 ng/dL (ref 0.82–1.77)

## 2024-02-11 IMAGING — US US BREAST*L* LIMITED INC AXILLA
1 series · 6 of 6 positions shown · non-contrast
Comparison: Previous exam(s).

CLINICAL DATA: Patient returns after screening study for evaluation
of possible LEFT breast mass. History of RIGHT mastectomy in Sunday May, 2021. The patient had LEFT-sided Port-A-Cath for chemotherapy.

EXAM:
DIGITAL DIAGNOSTIC UNILATERAL LEFT MAMMOGRAM WITH TOMOSYNTHESIS AND
CAD; ULTRASOUND LEFT BREAST LIMITED
TECHNIQUE: Left digital diagnostic mammography and breast tomosynthesis was
performed. The images were evaluated with computer-aided detection.;
Targeted ultrasound examination of the left breast was performed.

[Series 1: us breast*left* limited inc axilla · 0.04mm/px · 6 of 6 slices shown]
[im 1/6]
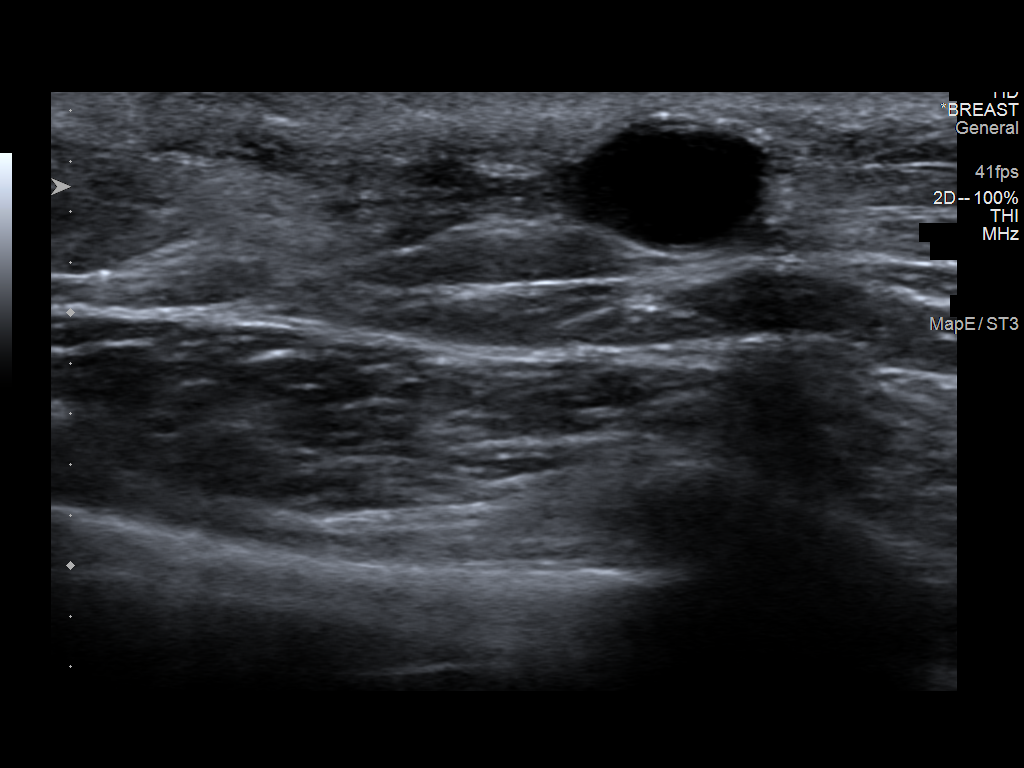
[im 2/6]
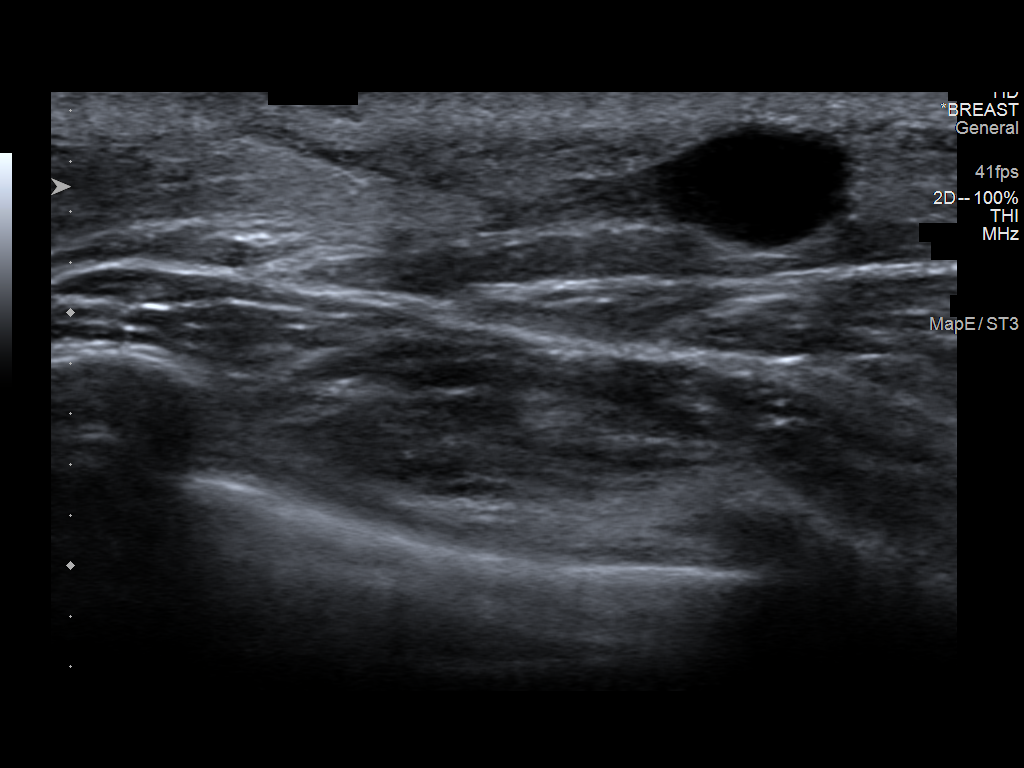
[im 3/6]
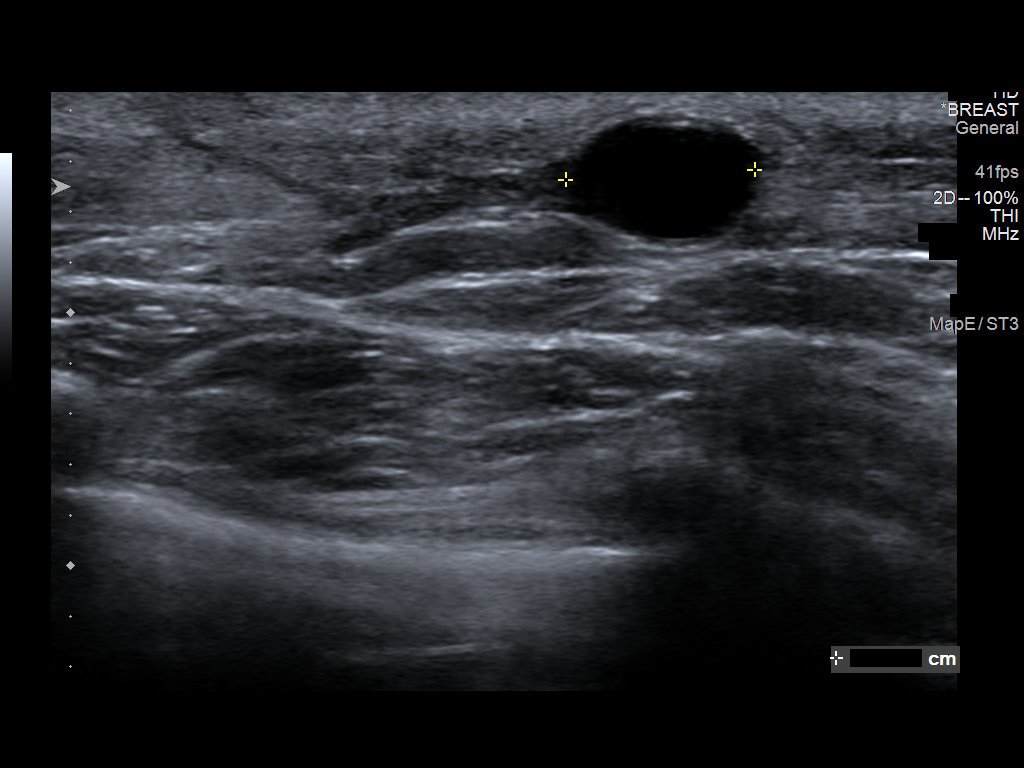
[im 4/6]
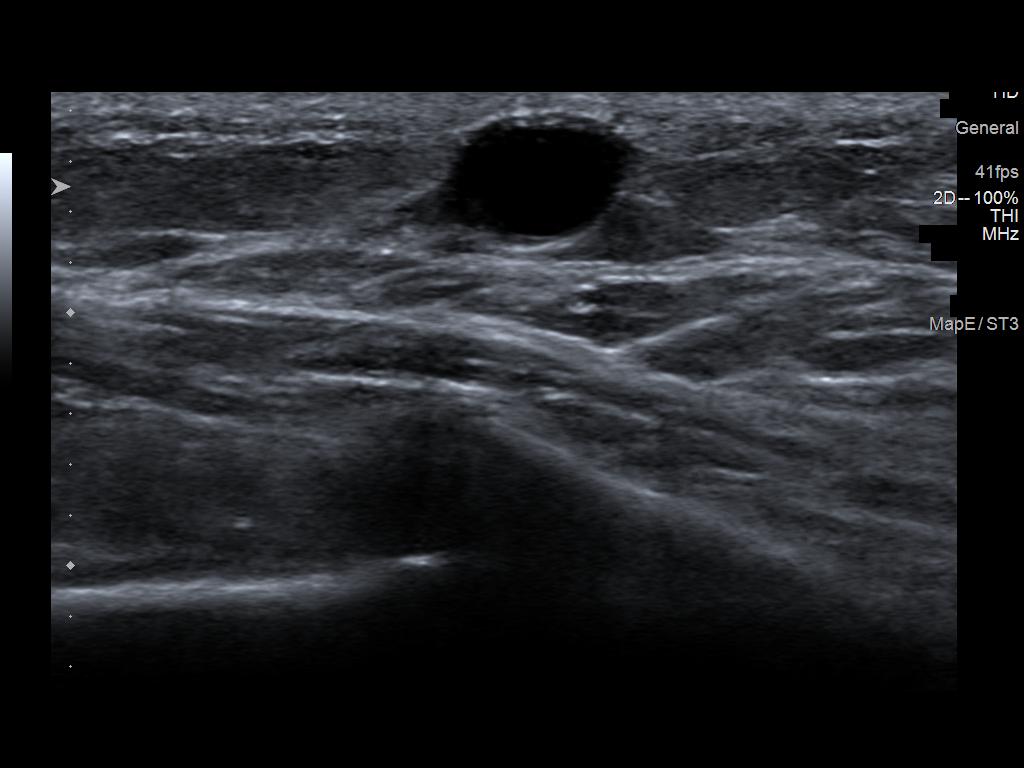
[im 5/6]
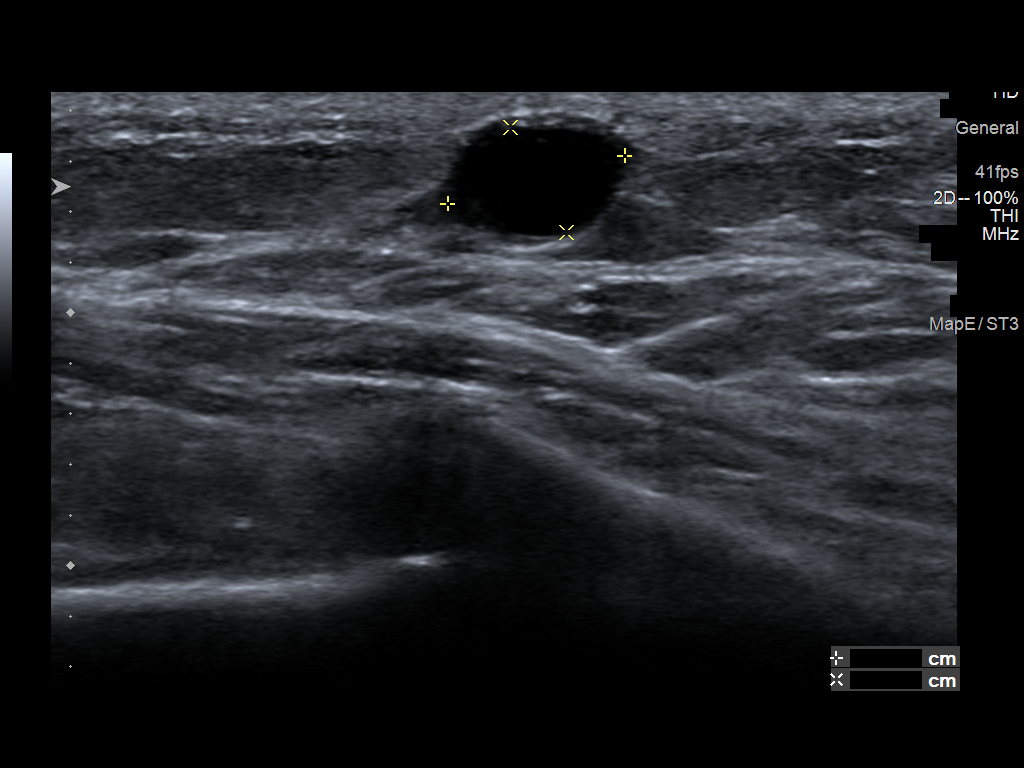
[im 6/6]
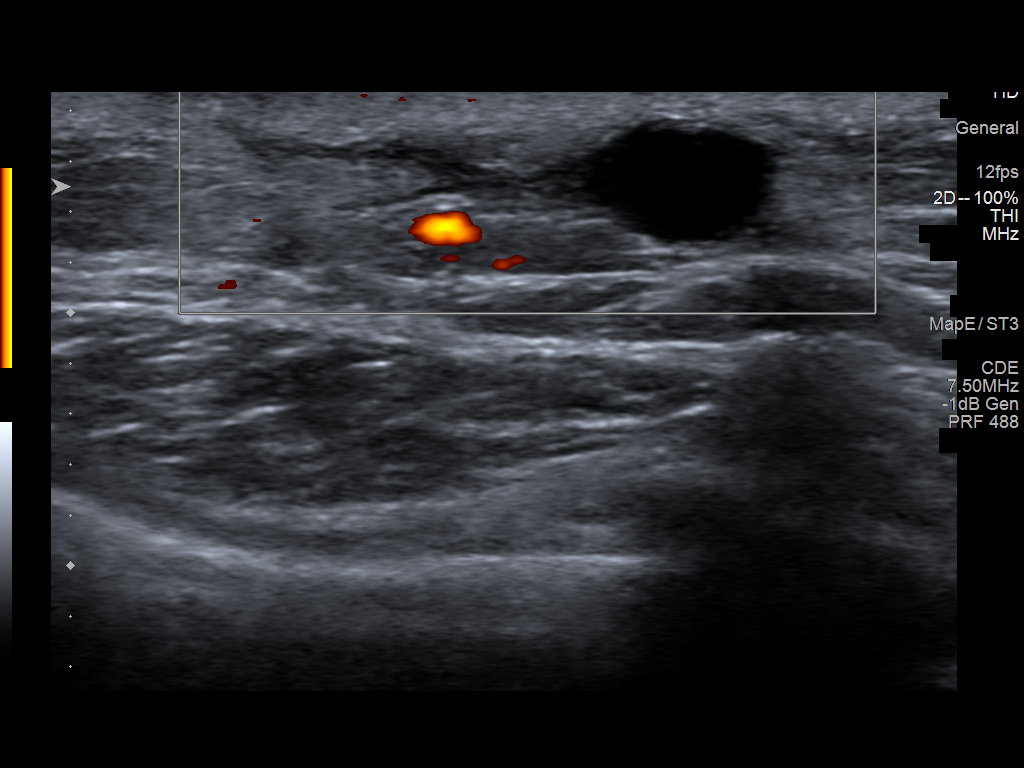

[6 of 6 positions shown; findings below may reference images not displayed]

ACR Breast Density Category c: The breast tissue is heterogeneously
dense, which may obscure small masses.
FINDINGS: Additional 2-D and 3-D images are performed. The Port-A-Cath scar
and a small palpable superficial mass just below the scar are both
marked for repeat views. These views confirm the asymmetry in the
superior portion of the LEFT breast correlates well with the
superficial palpable abnormality near the scar.

On physical exam, there is a well-healed Port-A-Cath scar in the
UPPER INNER LEFT chest wall. Immediately inferior to the scar, I
palpate a superficial mobile oval mass.

Targeted ultrasound is performed, showing postoperative changes and
a small seroma cavity of the Port-A-Cath scar, 11 o'clock location 9
centimeters from the nipple. Small seroma cavity measures 0.8 x
x 0.5 centimeters in correlates with the palpable finding.
IMPRESSION: Port-A-Cath scar and associated small seroma cavity account for the
mammographic finding. No mammographic or ultrasound evidence for
malignancy.

RECOMMENDATION:
Diagnostic mammogram is suggested in 1 year. (Code:[id number]-W-1JM)

I have discussed the findings and recommendations with the patient.
If applicable, a reminder letter will be sent to the patient
regarding the next appointment.

BI-RADS CATEGORY  2: Benign.

## 2024-02-11 IMAGING — MG MM DIGITAL DIAGNOSTIC UNILAT*L* W/ TOMO W/ CAD
6 series · 6 of 18 positions shown · non-contrast
Comparison: Previous exam(s).

CLINICAL DATA: Patient returns after screening study for evaluation
of possible LEFT breast mass. History of RIGHT mastectomy in Sunday May, 2021. The patient had LEFT-sided Port-A-Cath for chemotherapy.

EXAM:
DIGITAL DIAGNOSTIC UNILATERAL LEFT MAMMOGRAM WITH TOMOSYNTHESIS AND
CAD; ULTRASOUND LEFT BREAST LIMITED
TECHNIQUE: Left digital diagnostic mammography and breast tomosynthesis was
performed. The images were evaluated with computer-aided detection.;
Targeted ultrasound examination of the left breast was performed.

[L MLO synth-2D (1 of 2)]
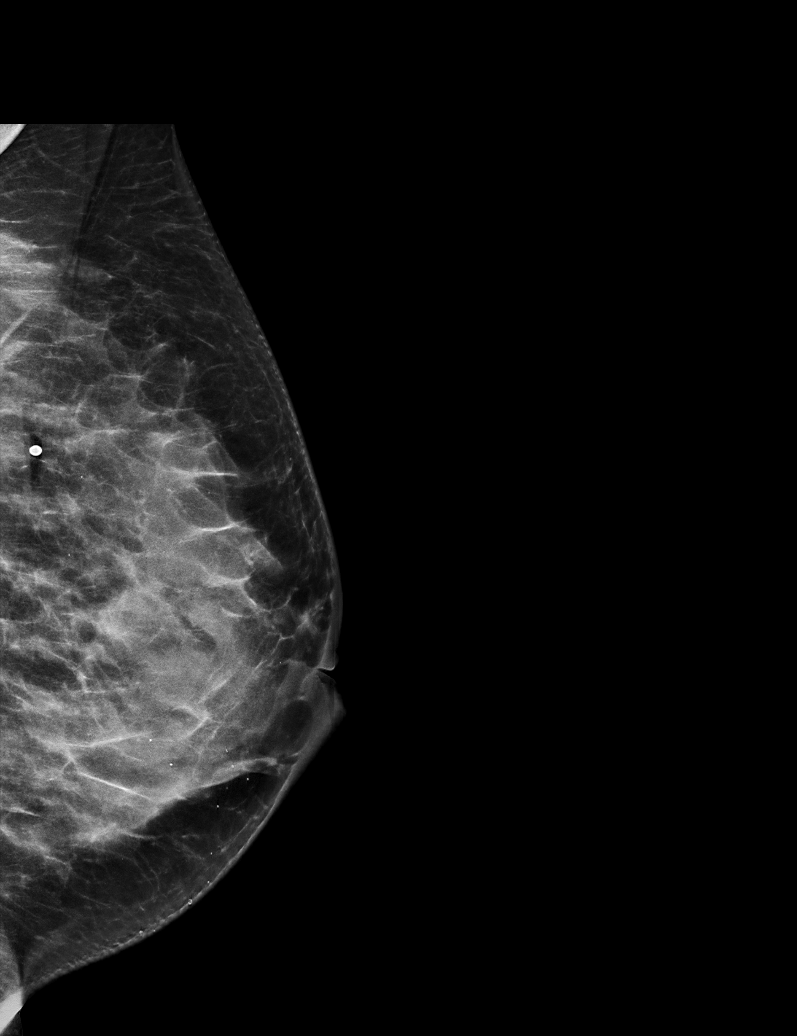

[L ML synth-2D]
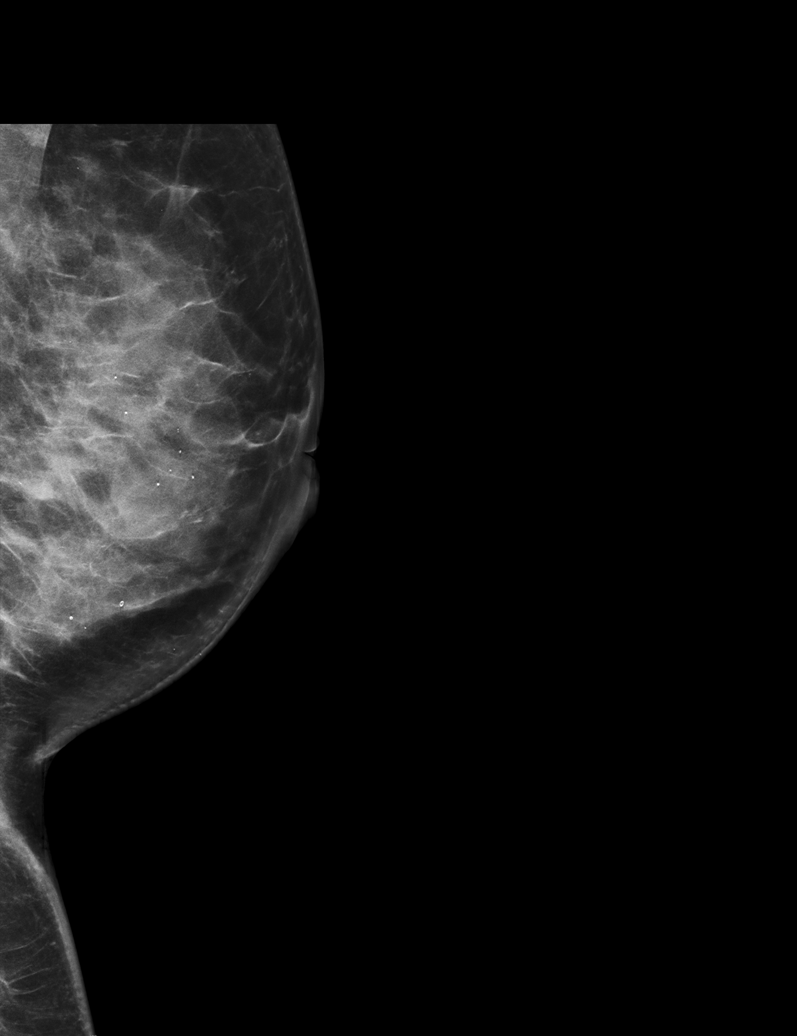

[L MLO synth-2D (2 of 2)]
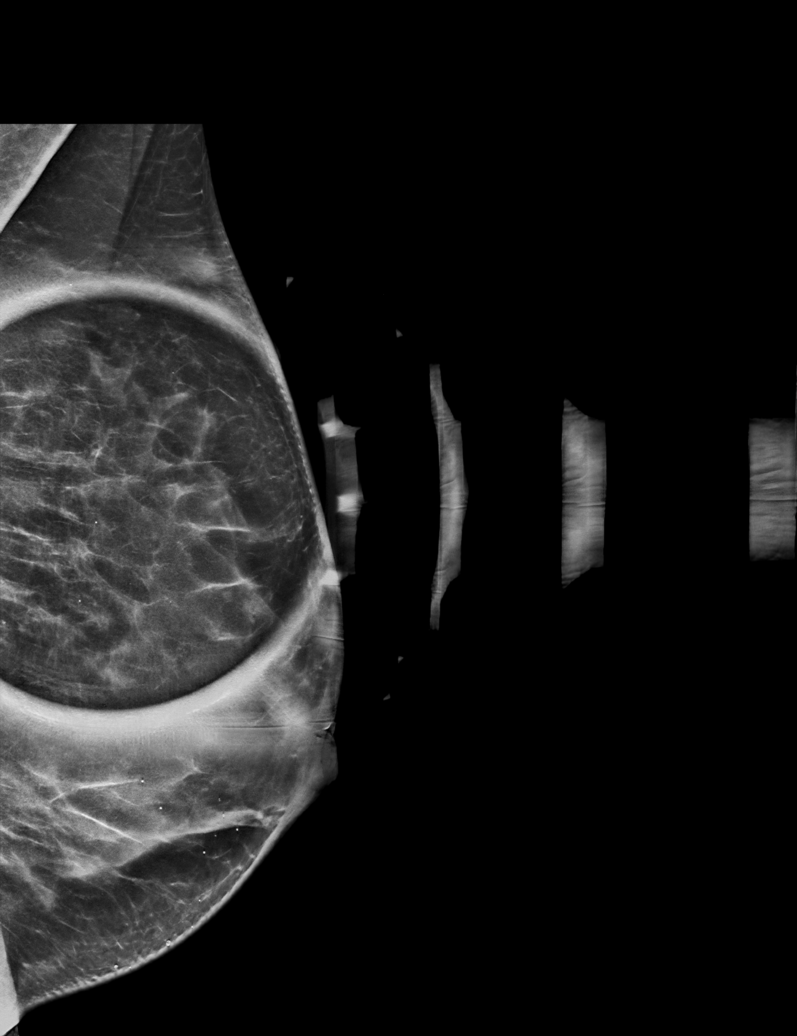

[L MLO tomo (1 of 2) · tomo slice 33/66.0]
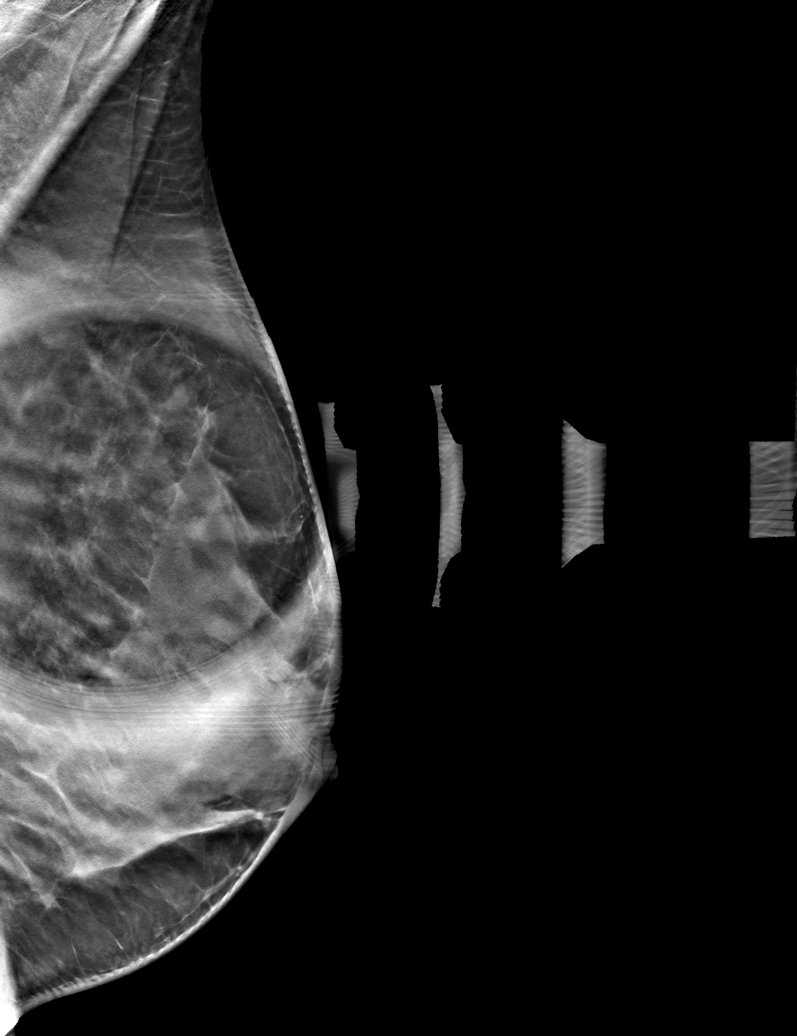

[L MLO tomo (2 of 2) · tomo slice 29/57.0]
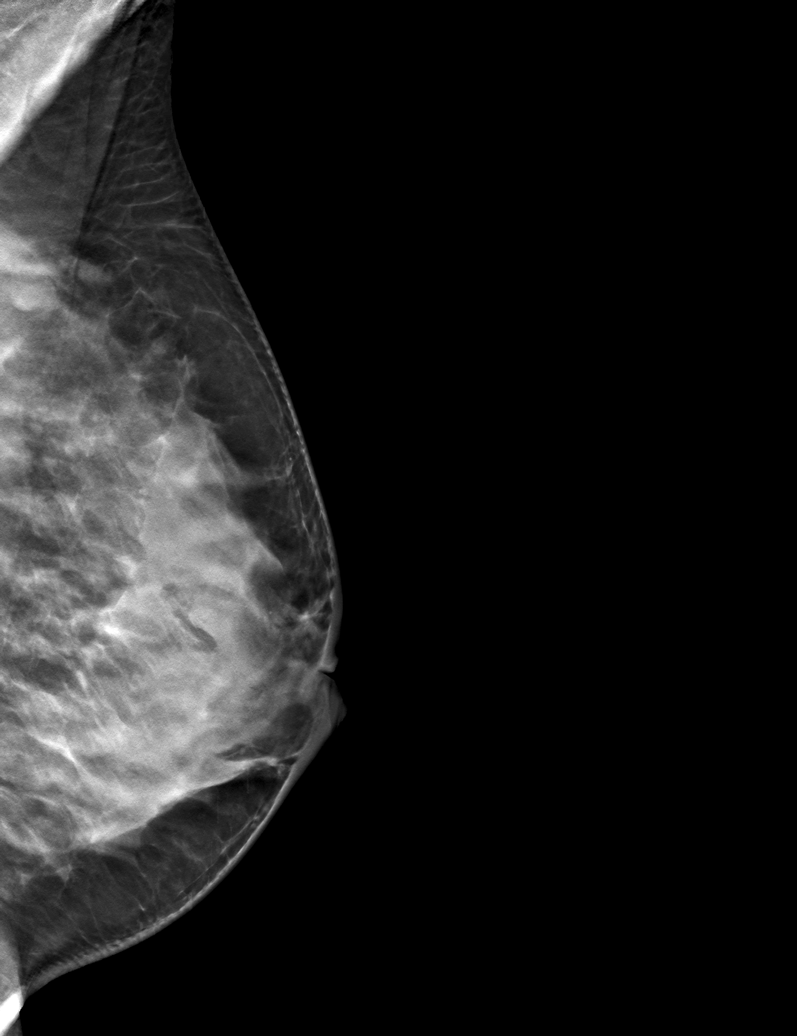

[L ML tomo · tomo slice 34/67.0]
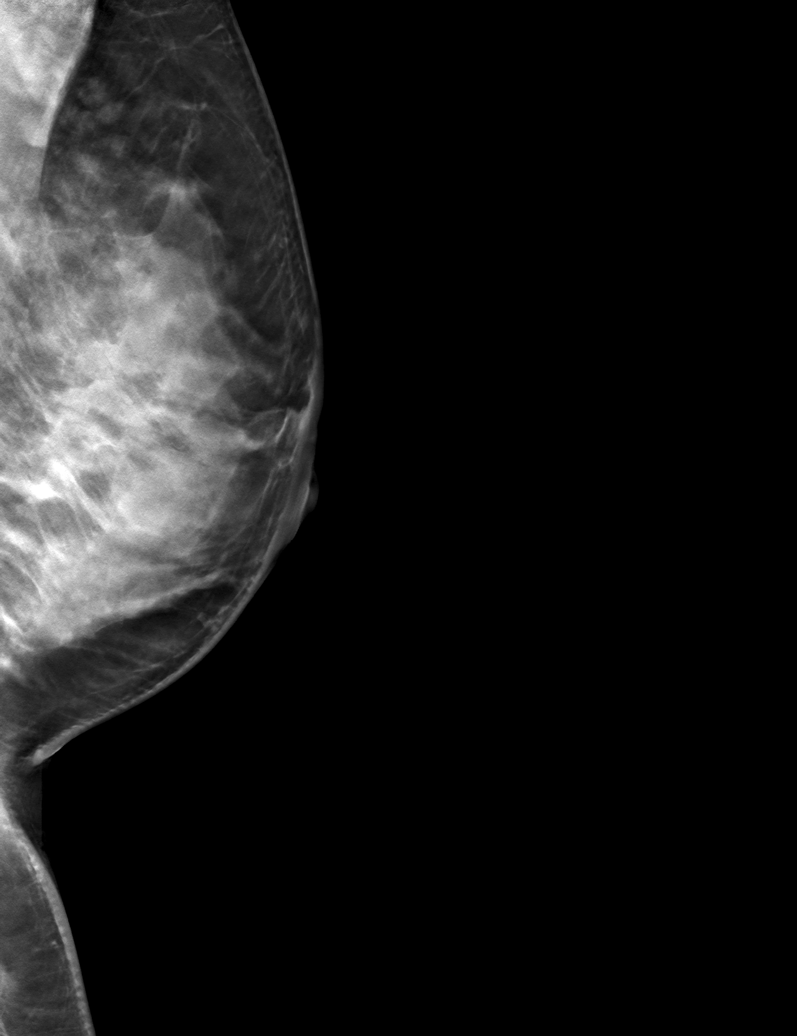

[6 of 18 positions shown; findings below may reference images not displayed]

ACR Breast Density Category c: The breast tissue is heterogeneously
dense, which may obscure small masses.
FINDINGS: Additional 2-D and 3-D images are performed. The Port-A-Cath scar
and a small palpable superficial mass just below the scar are both
marked for repeat views. These views confirm the asymmetry in the
superior portion of the LEFT breast correlates well with the
superficial palpable abnormality near the scar.

On physical exam, there is a well-healed Port-A-Cath scar in the
UPPER INNER LEFT chest wall. Immediately inferior to the scar, I
palpate a superficial mobile oval mass.

Targeted ultrasound is performed, showing postoperative changes and
a small seroma cavity of the Port-A-Cath scar, 11 o'clock location 9
centimeters from the nipple. Small seroma cavity measures 0.8 x
x 0.5 centimeters in correlates with the palpable finding.
IMPRESSION: Port-A-Cath scar and associated small seroma cavity account for the
mammographic finding. No mammographic or ultrasound evidence for
malignancy.

RECOMMENDATION:
Diagnostic mammogram is suggested in 1 year. (Code:[id number])

I have discussed the findings and recommendations with the patient.
If applicable, a reminder letter will be sent to the patient
regarding the next appointment.

BI-RADS CATEGORY  2: Benign.

## 2024-02-20 ENCOUNTER — Ambulatory Visit: Admitting: "Endocrinology

## 2024-02-20 ENCOUNTER — Encounter: Payer: Self-pay | Admitting: "Endocrinology

## 2024-02-20 VITALS — BP 96/68 | HR 80 | Ht 61.0 in | Wt 129.6 lb

## 2024-02-20 DIAGNOSIS — E782 Mixed hyperlipidemia: Secondary | ICD-10-CM

## 2024-02-20 DIAGNOSIS — E890 Postprocedural hypothyroidism: Secondary | ICD-10-CM

## 2024-02-20 MED ORDER — LEVOTHYROXINE SODIUM 100 MCG PO TABS: 100.0000 ug | ORAL_TABLET | Freq: Every day | ORAL | 3 refills | Status: AC

## 2024-02-20 NOTE — Progress Notes (Signed)
 02/20/2024  Endocrinology follow-up note   Subjective:    Patient ID: Sophia Moore, female    DOB: 03-09-1981, PCP Joenathan Muslim, FNP   Past Medical History:  Diagnosis Date   Depression    takes Citalopram  daily   Family history of breast cancer    Personal history of chemotherapy    Personal history of radiation therapy    Thyroid  nodule 07/2016   Past Surgical History:  Procedure Laterality Date   BREAST BIOPSY Right 04/2021   IR IMAGING GUIDED PORT INSERTION  06/27/2021   IR REMOVAL TUN ACCESS W/ PORT W/O FL MOD SED  11/29/2021   MASTECTOMY Right 05/2021   MASTECTOMY W/ SENTINEL NODE BIOPSY Right 06/01/2021   Procedure: RIGHT MASTECTOMY WITH AXILLARY SENTINEL LYMPH NODE BIOPSY;  Surgeon: Enid Harry, MD;  [facility]  [facility];  Service: General;  Laterality: Right;   THYROIDECTOMY  09/13/2016   THYROIDECTOMY N/A 09/13/2016   Procedure: TOTAL THYROIDECTOMY;  Surgeon: Reynold Caves, MD;  Location: [facility] OR;  Service: ENT;  Laterality: N/A;   wisdom teeth extracted      WISDOM TOOTH EXTRACTION     Social History   Socioeconomic History   Marital status: Married    Spouse name: Not on file   Number of children: Not on file   Years of education: Not on file   Highest education level: Not on file  Occupational History   Not on file  Tobacco Use   Smoking status: Never   Smokeless tobacco: Never  Vaping Use   Vaping status: Never Used  Substance and Sexual Activity   Alcohol use: No   Drug use: No   Sexual activity: Not Currently    Birth control/protection: None  Other Topics Concern   Not on file  Social History Narrative   Not on file   Social Drivers of Health   Financial Resource Strain: High Risk (07/20/2021)   Overall Financial Resource Strain (CARDIA)    Difficulty of Paying Living Expenses: Very hard  Food Insecurity: Not on file  Transportation Needs: No Transportation Needs (05/04/2021)   PRAPARE -  Transportation    Lack of Transportation (Medical): No    Lack of Transportation (Non-Medical): No  Physical Activity: Not on file  Stress: Not on file  Social Connections: Not on file   Outpatient Encounter Medications as of 02/20/2024  Medication Sig   cholecalciferol (VITAMIN D3) 25 MCG (1000 UNIT) tablet Take 1,000 Units by mouth daily.   citalopram  (CELEXA ) 20 MG tablet Take 20 mg by mouth daily.   doxycycline  (VIBRA -TABS) 100 MG tablet Take by mouth.   levothyroxine  (SYNTHROID ) 100 MCG tablet Take 1 tablet (100 mcg total) by mouth daily before breakfast.   olopatadine  (PATADAY ) 0.1 % ophthalmic solution Place 1 drop into both eyes 2 (two) times daily.   tamoxifen  (NOLVADEX ) 20 MG tablet Take 1 tablet (20 mg total) by mouth daily.   [DISCONTINUED] levothyroxine  (SYNTHROID ) 100 MCG tablet Take 1 tablet (100 mcg total) by mouth daily before breakfast.   No facility-administered encounter medications on file as of 02/20/2024.   ALLERGIES: Allergies  Allergen Reactions   Amoxicillin Rash   Penicillins Rash    Has patient had a PCN reaction causing immediate rash, facial/tongue/throat swelling, SOB or lightheadedness with hypotension: Yes Has patient had a PCN reaction causing severe rash involving mucus membranes or skin necrosis: No Has patient had a PCN reaction that required hospitalization No Has patient had a PCN reaction occurring  within the last 10 years: Yes If all of the above answers are "NO", then may proceed with Cephalosporin use.    VACCINATION STATUS: Immunization History  Administered Date(s) Administered   PPD Test 04/24/2016   Tdap 07/02/2020    HPI 43 year old female patient with medical history as above. 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  - Her surgical pathology was negative for malignancy.  She is currently on levothyroxine  100 mcg p.o. daily before breakfast.  Her  previsit thyroid  function tests are consistent with appropriate replacement.    -Recently, she was diagnosed with breast cancer s/p surgery and chemotherapy.  She reports symptoms of fatigue, and few pounds of weight gain. She denies palpitations, tremors, nor heat intolerance.  - She denies family history of thyroid  dysfunction. She denies exposure to neck radiation. She denies heat/cold intolerance. She has steady body weight. Denies dysphagia, shortness of breath, nor voice change.  Review of Systems  Constitutional:  + Slightly fluctuating heart rate,  no fatigue, no subjective hyperthermia/hypothermia   Objective:    BP 96/68   Pulse 80   Ht 5\' 1"  (1.549 m)   Wt 129 lb 9.6 oz (58.8 kg)   BMI 24.49 kg/m   Wt Readings from Last 3 Encounters:  02/20/24 129 lb 9.6 oz (58.8 kg)  08/16/23 124 lb 8 oz (56.5 kg)  07/23/23 125 lb 9.6 oz (57 kg)      Physical Exam- Limited  Constitutional:  Body mass index is 24.49 kg/m. , not in acute distress, normal state of mind    09/13/2016 surgical pathology of total thyroidectomy-no malignancy. Thyroid , thyroidectomy, Right - FOLLICULAR ADENOMA WITH CALCIFICATIONS, 2.1 CM. - MICROSCOPIC INTRATHYROIDAL PARATHYROID TISSUE. - THERE IS NO EVIDENCE OF MALIGNANCY.  July 20 11/29/2015 fine-needle aspiration of 1.5 cm solitary right lobe thyroid  nodule showed: Follicular neoplasm or suspicious for a follicular neoplasm (bethesda  category IV).  January 29, 2018 thyroid /neck ultrasound: Thyroid  surgically absent with no evidence of remnant or recurrence.  Recent Results (from the past 2160 hours)  Lipid Panel     Status: None   Collection Time: 01/28/24  9:34 AM  Result Value Ref Range   Cholesterol, Total 163 100 - 199 mg/dL   Triglycerides 130 0 - 149 mg/dL   HDL 57 >86 mg/dL   VLDL Cholesterol Cal 19 5 - 40 mg/dL   LDL Chol Calc (NIH) 87 0 - 99 mg/dL   Chol/HDL Ratio 2.9 0.0 - 4.4 ratio    Comment:                                    T. Chol/HDL Ratio                                             Men  Women                               1/2 Avg.Risk  3.4    3.3                                   Avg.Risk  5.0  4.4                                2X Avg.Risk  9.6    7.1                                3X Avg.Risk 23.4   11.0   T4, Free     Status: None   Collection Time: 01/28/24  9:34 AM  Result Value Ref Range   Free T4 1.51 0.82 - 1.77 ng/dL  TSH     Status: None   Collection Time: 01/28/24  9:34 AM  Result Value Ref Range   TSH 1.270 0.450 - 4.500 uIU/mL     Assessment & Plan:  1. 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    Her surgical sample diagnosis showed no malignancy.    - She did not require radioactive iodine ablative therapy.   -Regarding her postsurgical hypothyroidism: -Her thyroid  function tests are consistent with appropriate replacement.  She is advised to continue  levothyroxine   100 mcg p.o. daily before breakfast.     - We discussed about the correct intake of her thyroid  hormone, on empty stomach at fasting, with water, separated by at least 30 minutes from breakfast and other medications,  and separated by more than 4 hours from calcium , iron, multivitamins, acid reflux medications (PPIs). -Patient is made aware of the fact that thyroid  hormone replacement is needed for life, dose to be adjusted by periodic monitoring of thyroid  function tests.   She is encouraged to maintain close follow-up with her oncology service. She has improved her lipid profile with LDL at 87 improving from 107.   She does not want antilipid medication.  She was adopted plant-based protein, minimizing her intake of edema 5.    - I advised patient to maintain close follow up with Joenathan Muslim, FNP for primary care needs.   I spent  21  minutes in the care of the patient today including review of  labs from Thyroid  Function, CMP, and other relevant labs ; imaging/biopsy records (current and previous including abstractions from other facilities); face-to-face time discussing  her lab results and symptoms, medications doses, her options of short and long term treatment based on the latest standards of care / guidelines;   and documenting the encounter.  Winda Hastings Turrubiartes  participated in the discussions, expressed understanding, and voiced agreement with the above plans.  All questions were answered to her satisfaction. she is encouraged to contact clinic should she have any questions or concerns prior to her return visit.   Follow up plan: Return in about 1 year (around 02/19/2025) for Fasting Labs  in AM B4 8.  Kalvin Orf, MD Phone: [phone]  Fax: [phone]  -  This note was partially dictated with voice recognition software. Similar sounding words can be transcribed inadequately or may not  be corrected upon review.  02/20/2024, 12:50 PM

## 2025-02-19 ENCOUNTER — Ambulatory Visit: Admitting: "Endocrinology
